# Patient Record
Sex: Male | Born: 1935 | Race: Black or African American | Hispanic: No | Marital: Married | Smoking: Former smoker
Health system: Southern US, Community
[De-identification: ages and names within clinical notes are randomized; demographics above are authoritative.]

## PROBLEM LIST (undated history)

## (undated) DIAGNOSIS — G629 Polyneuropathy, unspecified: Secondary | ICD-10-CM

## (undated) DIAGNOSIS — L89609 Pressure ulcer of unspecified heel, unspecified stage: Secondary | ICD-10-CM

## (undated) DIAGNOSIS — N183 Chronic kidney disease, stage 3 unspecified: Secondary | ICD-10-CM

## (undated) DIAGNOSIS — E039 Hypothyroidism, unspecified: Secondary | ICD-10-CM

## (undated) DIAGNOSIS — F0390 Unspecified dementia without behavioral disturbance: Secondary | ICD-10-CM

## (undated) DIAGNOSIS — Z531 Procedure and treatment not carried out because of patient's decision for reasons of belief and group pressure: Secondary | ICD-10-CM

## (undated) DIAGNOSIS — M869 Osteomyelitis, unspecified: Secondary | ICD-10-CM

## (undated) DIAGNOSIS — Z466 Encounter for fitting and adjustment of urinary device: Secondary | ICD-10-CM

## (undated) DIAGNOSIS — F4310 Post-traumatic stress disorder, unspecified: Secondary | ICD-10-CM

## (undated) DIAGNOSIS — R339 Retention of urine, unspecified: Secondary | ICD-10-CM

## (undated) DIAGNOSIS — F29 Unspecified psychosis not due to a substance or known physiological condition: Secondary | ICD-10-CM

## (undated) DIAGNOSIS — Z22322 Carrier or suspected carrier of Methicillin resistant Staphylococcus aureus: Secondary | ICD-10-CM

## (undated) DIAGNOSIS — N139 Obstructive and reflux uropathy, unspecified: Secondary | ICD-10-CM

## (undated) DIAGNOSIS — I10 Essential (primary) hypertension: Secondary | ICD-10-CM

## (undated) DIAGNOSIS — J90 Pleural effusion, not elsewhere classified: Secondary | ICD-10-CM

## (undated) DIAGNOSIS — I480 Paroxysmal atrial fibrillation: Secondary | ICD-10-CM

## (undated) DIAGNOSIS — C61 Malignant neoplasm of prostate: Secondary | ICD-10-CM

## (undated) DIAGNOSIS — K219 Gastro-esophageal reflux disease without esophagitis: Secondary | ICD-10-CM

## (undated) DIAGNOSIS — R0602 Shortness of breath: Secondary | ICD-10-CM

## (undated) HISTORY — PX: SUBDURAL HEMATOMA EVACUATION VIA CRANIOTOMY: SUR319

## (undated) HISTORY — PX: CHOLECYSTECTOMY: SHX55

## (undated) HISTORY — PX: ORCHIECTOMY: SHX2116

---

## 2001-06-04 ENCOUNTER — Encounter: Payer: Self-pay | Admitting: Emergency Medicine

## 2001-06-04 ENCOUNTER — Inpatient Hospital Stay (HOSPITAL_COMMUNITY): Admission: EM | Admit: 2001-06-04 | Discharge: 2001-06-14 | Payer: Self-pay

## 2001-06-05 ENCOUNTER — Encounter: Payer: Self-pay | Admitting: Internal Medicine

## 2001-06-07 ENCOUNTER — Encounter: Payer: Self-pay | Admitting: Neurology

## 2001-07-05 ENCOUNTER — Other Ambulatory Visit: Admission: RE | Admit: 2001-07-05 | Discharge: 2001-07-05 | Payer: Self-pay | Admitting: Urology

## 2001-08-08 ENCOUNTER — Ambulatory Visit (HOSPITAL_BASED_OUTPATIENT_CLINIC_OR_DEPARTMENT_OTHER): Admission: RE | Admit: 2001-08-08 | Discharge: 2001-08-08 | Payer: Self-pay | Admitting: Urology

## 2005-04-09 ENCOUNTER — Ambulatory Visit: Payer: Self-pay | Admitting: Family Medicine

## 2005-04-23 ENCOUNTER — Ambulatory Visit: Payer: Self-pay | Admitting: Family Medicine

## 2011-12-09 ENCOUNTER — Encounter (HOSPITAL_COMMUNITY): Payer: Self-pay | Admitting: Emergency Medicine

## 2011-12-09 ENCOUNTER — Inpatient Hospital Stay (HOSPITAL_COMMUNITY)
Admission: EM | Admit: 2011-12-09 | Discharge: 2011-12-12 | DRG: 699 | Disposition: A | Discharge status: Home / Self Care | Payer: Medicare Other | Attending: Internal Medicine | Admitting: Internal Medicine

## 2011-12-09 ENCOUNTER — Emergency Department (HOSPITAL_COMMUNITY): Payer: Medicare Other

## 2011-12-09 ENCOUNTER — Other Ambulatory Visit: Payer: Self-pay

## 2011-12-09 DIAGNOSIS — R339 Retention of urine, unspecified: Secondary | ICD-10-CM

## 2011-12-09 DIAGNOSIS — N289 Disorder of kidney and ureter, unspecified: Secondary | ICD-10-CM

## 2011-12-09 DIAGNOSIS — D649 Anemia, unspecified: Secondary | ICD-10-CM

## 2011-12-09 DIAGNOSIS — R300 Dysuria: Secondary | ICD-10-CM | POA: Diagnosis present

## 2011-12-09 DIAGNOSIS — N390 Urinary tract infection, site not specified: Secondary | ICD-10-CM | POA: Diagnosis present

## 2011-12-09 DIAGNOSIS — N139 Obstructive and reflux uropathy, unspecified: Principal | ICD-10-CM | POA: Diagnosis present

## 2011-12-09 DIAGNOSIS — Z9079 Acquired absence of other genital organ(s): Secondary | ICD-10-CM

## 2011-12-09 DIAGNOSIS — Z9981 Dependence on supplemental oxygen: Secondary | ICD-10-CM

## 2011-12-09 DIAGNOSIS — E039 Hypothyroidism, unspecified: Secondary | ICD-10-CM | POA: Diagnosis present

## 2011-12-09 DIAGNOSIS — I10 Essential (primary) hypertension: Secondary | ICD-10-CM | POA: Diagnosis present

## 2011-12-09 DIAGNOSIS — Z8546 Personal history of malignant neoplasm of prostate: Secondary | ICD-10-CM

## 2011-12-09 DIAGNOSIS — J90 Pleural effusion, not elsewhere classified: Secondary | ICD-10-CM | POA: Diagnosis present

## 2011-12-09 DIAGNOSIS — E119 Type 2 diabetes mellitus without complications: Secondary | ICD-10-CM | POA: Diagnosis present

## 2011-12-09 DIAGNOSIS — R0602 Shortness of breath: Secondary | ICD-10-CM | POA: Diagnosis present

## 2011-12-09 DIAGNOSIS — I4891 Unspecified atrial fibrillation: Secondary | ICD-10-CM | POA: Diagnosis present

## 2011-12-09 DIAGNOSIS — N179 Acute kidney failure, unspecified: Secondary | ICD-10-CM | POA: Diagnosis present

## 2011-12-09 DIAGNOSIS — I480 Paroxysmal atrial fibrillation: Secondary | ICD-10-CM | POA: Diagnosis present

## 2011-12-09 HISTORY — DX: Essential (primary) hypertension: I10

## 2011-12-09 HISTORY — DX: Unspecified psychosis not due to a substance or known physiological condition: F29

## 2011-12-09 HISTORY — DX: Malignant neoplasm of prostate: C61

## 2011-12-09 HISTORY — DX: Unspecified dementia, unspecified severity, without behavioral disturbance, psychotic disturbance, mood disturbance, and anxiety: F03.90

## 2011-12-09 HISTORY — DX: Chronic kidney disease, stage 3 (moderate): N18.3

## 2011-12-09 HISTORY — DX: Post-traumatic stress disorder, unspecified: F43.10

## 2011-12-09 HISTORY — DX: Hypothyroidism, unspecified: E03.9

## 2011-12-09 HISTORY — DX: Chronic kidney disease, stage 3 unspecified: N18.30

## 2011-12-09 HISTORY — DX: Pleural effusion, not elsewhere classified: J90

## 2011-12-09 HISTORY — DX: Paroxysmal atrial fibrillation: I48.0

## 2011-12-09 LAB — CBC
Platelets: 172 10*3/uL (ref 150–400)
RBC: 3.96 MIL/uL — ABNORMAL LOW (ref 4.22–5.81)
WBC: 14.1 10*3/uL — ABNORMAL HIGH (ref 4.0–10.5)

## 2011-12-09 LAB — DIFFERENTIAL
Basophils Absolute: 0 10*3/uL (ref 0.0–0.1)
Eosinophils Absolute: 0.2 10*3/uL (ref 0.0–0.7)
Lymphocytes Relative: 9 % — ABNORMAL LOW (ref 12–46)
Lymphs Abs: 1.3 10*3/uL (ref 0.7–4.0)
Neutrophils Relative %: 79 % — ABNORMAL HIGH (ref 43–77)

## 2011-12-09 LAB — COMPREHENSIVE METABOLIC PANEL
ALT: 10 U/L (ref 0–53)
AST: 32 U/L (ref 0–37)
Albumin: 3 g/dL — ABNORMAL LOW (ref 3.5–5.2)
Alkaline Phosphatase: 62 U/L (ref 39–117)
BUN: 51 mg/dL — ABNORMAL HIGH (ref 6–23)
Chloride: 102 mEq/L (ref 96–112)
Potassium: 4.7 mEq/L (ref 3.5–5.1)
Sodium: 137 mEq/L (ref 135–145)
Total Bilirubin: 0.3 mg/dL (ref 0.3–1.2)

## 2011-12-09 LAB — URINALYSIS, ROUTINE W REFLEX MICROSCOPIC
Bilirubin Urine: NEGATIVE
Nitrite: NEGATIVE
Specific Gravity, Urine: 1.019 (ref 1.005–1.030)
pH: 5.5 (ref 5.0–8.0)

## 2011-12-09 LAB — TROPONIN I: Troponin I: <0.3 ng/mL (ref <0.30)

## 2011-12-09 LAB — LACTIC ACID, PLASMA: Lactic Acid, Venous: 1.8 mmol/L (ref 0.5–2.2)

## 2011-12-09 LAB — URINE MICROSCOPIC-ADD ON

## 2011-12-09 MED ORDER — PANTOPRAZOLE SODIUM 40 MG PO TBEC
80.0000 mg | DELAYED_RELEASE_TABLET | Freq: Every day | ORAL | Status: DC
  Administered 2011-12-10 – 2011-12-12 (×3): 80 mg via ORAL
  Filled 2011-12-09 (×3): qty 2

## 2011-12-09 MED ORDER — POLYETHYLENE GLYCOL 3350 17 G PO PACK
17.0000 g | PACK | Freq: Every day | ORAL | Status: DC | PRN
Start: 2011-12-09 — End: 2011-12-12
  Administered 2011-12-11: 17 g via ORAL
  Filled 2011-12-09 (×2): qty 1

## 2011-12-09 MED ORDER — FOLIC ACID 1 MG PO TABS
1.0000 mg | ORAL_TABLET | Freq: Every day | ORAL | Status: DC
  Administered 2011-12-10 – 2011-12-12 (×3): 1 mg via ORAL
  Filled 2011-12-09 (×3): qty 1

## 2011-12-09 MED ORDER — ENOXAPARIN SODIUM 40 MG/0.4ML ~~LOC~~ SOLN
40.0000 mg | SUBCUTANEOUS | Status: DC
  Administered 2011-12-10 – 2011-12-12 (×3): 40 mg via SUBCUTANEOUS
  Filled 2011-12-09 (×4): qty 0.4

## 2011-12-09 MED ORDER — TAMSULOSIN HCL 0.4 MG PO CAPS
0.4000 mg | ORAL_CAPSULE | Freq: Every day | ORAL | Status: DC
  Administered 2011-12-10 – 2011-12-12 (×3): 0.4 mg via ORAL
  Filled 2011-12-09 (×3): qty 1

## 2011-12-09 MED ORDER — FERROUS FUMARATE 325 (106 FE) MG PO TABS
1.0000 | ORAL_TABLET | Freq: Two times a day (BID) | ORAL | Status: DC
  Administered 2011-12-10 – 2011-12-12 (×4): 106 mg via ORAL
  Filled 2011-12-09 (×6): qty 1

## 2011-12-09 MED ORDER — CALCIUM CARBONATE-VITAMIN D 500-200 MG-UNIT PO TABS
1.0000 | ORAL_TABLET | Freq: Two times a day (BID) | ORAL | Status: DC
  Administered 2011-12-10 – 2011-12-12 (×4): 1 via ORAL
  Filled 2011-12-09 (×6): qty 1

## 2011-12-09 MED ORDER — ACETAMINOPHEN 325 MG PO TABS
650.0000 mg | ORAL_TABLET | Freq: Four times a day (QID) | ORAL | Status: DC | PRN
  Administered 2011-12-11: 650 mg via ORAL
  Filled 2011-12-09: qty 2

## 2011-12-09 MED ORDER — ACETAMINOPHEN 650 MG RE SUPP: 650.0000 mg | Freq: Four times a day (QID) | RECTAL | Status: DC | PRN

## 2011-12-09 MED ORDER — SENNA 8.6 MG PO TABS
1.0000 | ORAL_TABLET | Freq: Every day | ORAL | Status: DC
  Administered 2011-12-10 – 2011-12-12 (×3): 8.6 mg via ORAL
  Filled 2011-12-09 (×3): qty 1

## 2011-12-09 MED ORDER — ALBUTEROL SULFATE (5 MG/ML) 0.5% IN NEBU
2.5000 mg | INHALATION_SOLUTION | RESPIRATORY_TRACT | Status: DC
  Administered 2011-12-09 – 2011-12-10 (×5): 2.5 mg via RESPIRATORY_TRACT
  Filled 2011-12-09 (×6): qty 0.5

## 2011-12-09 MED ORDER — DOCUSATE SODIUM 100 MG PO CAPS
100.0000 mg | ORAL_CAPSULE | Freq: Every day | ORAL | Status: DC
  Administered 2011-12-10 – 2011-12-12 (×3): 100 mg via ORAL
  Filled 2011-12-09 (×3): qty 1

## 2011-12-09 MED ORDER — DILTIAZEM HCL ER 240 MG PO CP24
240.0000 mg | ORAL_CAPSULE | Freq: Every day | ORAL | Status: DC
  Administered 2011-12-10 – 2011-12-12 (×3): 240 mg via ORAL
  Filled 2011-12-09 (×3): qty 1

## 2011-12-09 MED ORDER — LEVOTHYROXINE SODIUM 50 MCG PO TABS
50.0000 ug | ORAL_TABLET | Freq: Every day | ORAL | Status: DC
  Administered 2011-12-10 – 2011-12-12 (×3): 50 ug via ORAL
  Filled 2011-12-09 (×3): qty 1

## 2011-12-09 MED ORDER — FUROSEMIDE 20 MG PO TABS
20.0000 mg | ORAL_TABLET | Freq: Every day | ORAL | Status: DC
  Filled 2011-12-09: qty 1

## 2011-12-09 MED ORDER — CIPROFLOXACIN IN D5W 200 MG/100ML IV SOLN
200.0000 mg | Freq: Two times a day (BID) | INTRAVENOUS | Status: DC
  Administered 2011-12-09 – 2011-12-10 (×3): 200 mg via INTRAVENOUS
  Filled 2011-12-09 (×5): qty 100

## 2011-12-09 MED ORDER — IPRATROPIUM BROMIDE 0.02 % IN SOLN
0.5000 mg | RESPIRATORY_TRACT | Status: DC
  Administered 2011-12-09 – 2011-12-10 (×5): 0.5 mg via RESPIRATORY_TRACT
  Filled 2011-12-09 (×6): qty 2.5

## 2011-12-09 MED ORDER — SENNOSIDES 8.6 MG PO TABS: 1.0000 | ORAL_TABLET | Freq: Every day | ORAL | Status: DC

## 2011-12-09 MED ORDER — SODIUM CHLORIDE 0.9 % IV SOLN
INTRAVENOUS | Status: DC
  Administered 2011-12-09 (×2): via INTRAVENOUS
  Administered 2011-12-10: 1000 mL via INTRAVENOUS
  Administered 2011-12-10 – 2011-12-11 (×2): via INTRAVENOUS

## 2011-12-09 NOTE — ED Provider Notes (Signed)
History     CSN: [id number]  Arrival date & time 12/09/11  1708   First MD Initiated Contact with Patient 12/09/11 1729      Chief Complaint  Patient presents with  . Weakness  . Urinary Retention    The history is provided by a relative and a caregiver. History Limited By: poor historian.  Pt was seen at 1745.  Per pt's caregiver/daughter, c/o gradual onset and worsening of persistent decreased urination and decreased PO intake for the past week.  Has been assoc with generalized weakness.  Pt was eval by Olivia Mackie MD PTA, foley passed with approx urine return, and pt was then sent to the ED for further eval and admit.  No reported N/V/D, no fevers, no CP/SOB.     Past Medical History  Diagnosis Date  . Prostate cancer   . Diabetes mellitus   . Hypertension   . Hypothyroidism   . Psychosis     Past Surgical History  Procedure Date  . Orchiectomy     bilateral  . Subdural hematoma evacuation via craniotomy   . Cholecystectomy     History  Substance Use Topics  . Smoking status: Never Smoker   . Smokeless tobacco: Not on file  . Alcohol Use: No      Review of Systems  Unable to perform ROS: Other    Allergies  Review of patient's allergies indicates no known allergies.  Home Medications   Current Outpatient Rx  Name Route Sig Dispense Refill  . CALCIUM CARBONATE-VITAMIN D 500-200 MG-UNIT PO TABS Oral Take 1 tablet by mouth 2 (two) times daily.    Marland Kitchen DILTIAZEM HCL ER 240 MG PO CP24 Oral Take 240 mg by mouth daily.    Marland Kitchen DOCUSATE SODIUM 100 MG PO CAPS Oral Take 100 mg by mouth daily.    Marland Kitchen ESOMEPRAZOLE MAGNESIUM 40 MG PO CPDR Oral Take 40 mg by mouth daily.    Marland Kitchen FERROUS FUMARATE 325 (106 FE) MG PO TABS Oral Take 1 tablet by mouth 2 (two) times daily.    Marland Kitchen FOLIC ACID 1 MG PO TABS Oral Take 1 mg by mouth daily.    . FUROSEMIDE 20 MG PO TABS Oral Take 20 mg by mouth daily.    . IPRATROPIUM-ALBUTEROL 0.5-2.5 (3) MG/3ML IN SOLN Nebulization Take 3 mLs by  nebulization every 6 (six) hours as needed. shorntess of breath/ wheezing    . LEVOTHYROXINE SODIUM 50 MCG PO TABS Oral Take 50 mcg by mouth daily.    Marland Kitchen POLYETHYLENE GLYCOL 3350 PO PACK Oral Take 17 g by mouth daily as needed. constipation    . SENNOSIDES 8.6 MG PO TABS Oral Take 1 tablet by mouth daily.    . SULFAMETHOXAZOLE-TRIMETHOPRIM 800-160 MG PO TABS Oral Take 1 tablet by mouth 2 (two) times daily.    Marland Kitchen TAMSULOSIN HCL 0.4 MG PO CAPS Oral Take 0.4 mg by mouth daily.      BP 115/39  Pulse 64  Temp(Src) 98.9 F (37.2 C) (Oral)  Resp 20  SpO2 98%  Physical Exam 1750: Physical examination:  Nursing notes reviewed; Vital signs and O2 SAT reviewed;  Constitutional: Well developed, Well nourished, In no acute distress; Head:  Normocephalic, atraumatic; Eyes: EOMI, PERRL, No scleral icterus; ENMT: Mouth and pharynx normal, Mucous membranes dry; Neck: Supple, Full range of motion, No lymphadenopathy; Cardiovascular: Regular rate and rhythm, No murmur or gallop; Respiratory: Breath sounds coarse & equal bilaterally, No rales, rhonchi, wheezes, Normal respiratory effort/excursion;  Chest: Nontender, Movement normal; Abdomen: Soft, +suprapubic area tender to palp, Nondistended, Normal bowel sounds, +foley draining dark brown urine; Extremities: Pulses normal, No tenderness, 1+ pedal edema bilat without calf asymmetry.; Neuro: Awake, alert, confused re: events.  Speech clear, no facial droop, moves all ext on stretcher.; Skin: Color normal, Warm, Dry, no rash.    ED Course  Procedures    MDM  MDM Reviewed: nursing note, vitals and previous chart Reviewed previous: ECG and labs Interpretation: ECG, labs and x-ray     Date: 12/09/2011  Rate: 71  Rhythm: normal sinus rhythm and premature atrial contractions (PAC)  QRS Axis: left  Intervals: PR prolonged  ST/T Wave abnormalities: nonspecific ST/T changes  Conduction Disutrbances:first-degree A-V block  and nonspecific intraventricular  conduction delay  Narrative Interpretation:   Old EKG Reviewed: changes noted; new flipped T-waves lateral leads compared to previous EKG dated 06/04/2001.   Results for orders placed during the hospital encounter of 12/09/11  CBC      Component Value Range   WBC 14.1 (*) 4.0 - 10.5 (K/uL)   RBC 3.96 (*) 4.22 - 5.81 (MIL/uL)   Hemoglobin 10.8 (*) 13.0 - 17.0 (g/dL)   HCT 04.5 (*) 40.9 - 52.0 (%)   MCV 88.1  78.0 - 100.0 (fL)   MCH 27.3  26.0 - 34.0 (pg)   MCHC 30.9  30.0 - 36.0 (g/dL)   RDW 81.1  91.4 - 78.2 (%)   Platelets 172  150 - 400 (K/uL)  DIFFERENTIAL      Component Value Range   Neutrophils Relative 79 (*) 43 - 77 (%)   Neutro Abs 11.2 (*) 1.7 - 7.7 (K/uL)   Lymphocytes Relative 9 (*) 12 - 46 (%)   Lymphs Abs 1.3  0.7 - 4.0 (K/uL)   Monocytes Relative 10  3 - 12 (%)   Monocytes Absolute 1.4 (*) 0.1 - 1.0 (K/uL)   Eosinophils Relative 1  0 - 5 (%)   Eosinophils Absolute 0.2  0.0 - 0.7 (K/uL)   Basophils Relative 0  0 - 1 (%)   Basophils Absolute 0.0  0.0 - 0.1 (K/uL)  LACTIC ACID, PLASMA      Component Value Range   Lactic Acid, Venous 1.8  0.5 - 2.2 (mmol/L)  TROPONIN I      Component Value Range   Troponin I <0.30  <0.30 (ng/mL)  PROCALCITONIN      Component Value Range   Procalcitonin 0.92    URINALYSIS, ROUTINE W REFLEX MICROSCOPIC      Component Value Range   Color, Urine AMBER (*) YELLOW    APPearance TURBID (*) CLEAR    Specific Gravity, Urine 1.019  1.005 - 1.030    pH 5.5  5.0 - 8.0    Glucose, UA NEGATIVE  NEGATIVE (mg/dL)   Hgb urine dipstick LARGE (*) NEGATIVE    Bilirubin Urine NEGATIVE  NEGATIVE    Ketones, ur NEGATIVE  NEGATIVE (mg/dL)   Protein, ur 956 (*) NEGATIVE (mg/dL)   Urobilinogen, UA 0.2  0.0 - 1.0 (mg/dL)   Nitrite NEGATIVE  NEGATIVE    Leukocytes, UA LARGE (*) NEGATIVE   COMPREHENSIVE METABOLIC PANEL      Component Value Range   Sodium 137  135 - 145 (mEq/L)   Potassium 4.7  3.5 - 5.1 (mEq/L)   Chloride 102  96 - 112 (mEq/L)    CO2 24  19 - 32 (mEq/L)   Glucose, Bld 138 (*) 70 - 99 (mg/dL)  BUN 51 (*) 6 - 23 (mg/dL)   Creatinine, Ser 4.09 (*) 0.50 - 1.35 (mg/dL)   Calcium 8.7  8.4 - 81.1 (mg/dL)   Total Protein 7.3  6.0 - 8.3 (g/dL)   Albumin 3.0 (*) 3.5 - 5.2 (g/dL)   AST 32  0 - 37 (U/L)   ALT 10  0 - 53 (U/L)   Alkaline Phosphatase 62  39 - 117 (U/L)   Total Bilirubin 0.3  0.3 - 1.2 (mg/dL)   GFR calc non Af Amer 16 (*) >90 (mL/min)   GFR calc Af Amer 18 (*) >90 (mL/min)  URINE MICROSCOPIC-ADD ON      Component Value Range   Squamous Epithelial / LPF RARE  RARE    WBC, UA 11-20  <3 (WBC/hpf)   RBC / HPF 21-50  <3 (RBC/hpf)   Bacteria, UA RARE  RARE    Urine-Other YEAST     Dg Chest 2 View 12/09/2011  *RADIOLOGY REPORT*  Clinical Data: Short of breath  CHEST - 2 VIEW  Comparison: 09/07/2011  Findings: Mild cardiomegaly.  Bilateral pleural effusions right greater than left.  Bibasilar opacities.  No pneumothorax.  Stable thoracic spine. Left PICC has been removed.  IMPRESSION: Bilateral pleural effusions and bibasilar opacities right greater than left stable.  Original Report Authenticated By: Donavan Burnet, M.D.     8:49 PM:  Elevated BUN/Cr, no old to compare.  Foley continuing to drain dark urine, UC pending.  Dx testing d/w pt and family.  Questions answered.  Verb understanding, agreeable to admit.  T/C to [facility], case discussed, including:  HPI, pertinent PM/SHx, VS/PE, dx testing, ED course and treatment:  Agreeable to admit, requests to obtain medical bed to team 1.         Laray Anger, DO 12/10/11 1506

## 2011-12-09 NOTE — H&P (Signed)
PCP:  Jesse Chandler in [place]  DOA:  12/09/2011  5:09 PM  Chief Complaint:  abdominal discomfort with Poor urine output for 3 days  HPI: History mainly provided by Daughter Jesse Chandler .  78 y/o AA male wth hx of prostate ca s/p resection in 2002, HTN, Paroxysmal a fib not on AC, hx of pleural effusion with pleural tap done at [place] twice last year and also a rt sided chest tube placed in without clear diagnosis, on chronic O2 via [place] ,  pseudomonas UTI , Hypothyroidism was sent to ED by urologist Dr Marrian Salvage at [facility] urology as patient was noted to have obstructive uropathy. As per daughter who lives with him patient was having very little urine output for past 3 days and having poor appetite with c/o abdominal discomfort. Since yesterday he hardly made any urine and his PCP set him up to see the  Urologist. The urologist placed in a foley and had 2 L urine output and sent patient to ED. Patient on exam denies any symptoms beside lower abdominal discomfort. Daughter informs patient having poor appetite for past  3 days and c/o dysuria but denies any fever,chllls, nausea, vomiting, headache, chest pain,palitation, SOB,cough  And bowel symptoms. Labs sent in ED showing leucocytosis, AKI and UA suggestive of UTI.  Allergies: No Known Allergies  Prior to Admission medications   Medication Sig Start Date End Date Taking? Authorizing Provider  calcium-vitamin D (OSCAL WITH D) 500-200 MG-UNIT per tablet Take 1 tablet by mouth 2 (two) times daily.   Yes Historical Provider, MD  diltiazem (DILACOR XR) 240 MG 24 hr capsule Take 240 mg by mouth daily.   Yes Historical Provider, MD  docusate sodium (COLACE) 100 MG capsule Take 100 mg by mouth daily.   Yes Historical Provider, MD  esomeprazole (NEXIUM) 40 MG capsule Take 40 mg by mouth daily.   Yes Historical Provider, MD  ferrous fumarate (HEMOCYTE - 106 MG FE) 325 (106 FE) MG TABS Take 1 tablet by mouth 2 (two) times daily.   Yes Historical Provider,  MD  folic acid (FOLVITE) 1 MG tablet Take 1 mg by mouth daily.   Yes Historical Provider, MD  furosemide (LASIX) 20 MG tablet Take 20 mg by mouth daily.   Yes Historical Provider, MD  ipratropium-albuterol (DUONEB) 0.5-2.5 (3) MG/3ML SOLN Take 3 mLs by nebulization every 6 (six) hours as needed. shorntess of breath/ wheezing   Yes Historical Provider, MD  levothyroxine (SYNTHROID, LEVOTHROID) 50 MCG tablet Take 50 mcg by mouth daily.   Yes Historical Provider, MD  polyethylene glycol (MIRALAX / GLYCOLAX) packet Take 17 g by mouth daily as needed. constipation   Yes Historical Provider, MD  senna (SENOKOT) 8.6 MG tablet Take 1 tablet by mouth daily.   Yes Historical Provider, MD  sulfamethoxazole-trimethoprim (BACTRIM DS,SEPTRA DS) 800-160 MG per tablet Take 1 tablet by mouth 2 (two) times daily.   Yes Historical Provider, MD  Tamsulosin HCl (FLOMAX) 0.4 MG CAPS Take 0.4 mg by mouth daily.   Yes Historical Provider, MD    Past Medical History  Diagnosis Date  . Prostate cancer   . Diabetes mellitus   . Hypertension   . Hypothyroidism   . Psychosis     Past Surgical History  Procedure Date  . Orchiectomy     bilateral  . Subdural hematoma evacuation via craniotomy   . Cholecystectomy     Social History:  reports that he has never smoked. He has never used smokeless tobacco.  He reports that he drinks alcohol. He reports that he does not use illicit drugs.  History reviewed. No pertinent family history.  Review of Systems:  Constitutional: Poor appetite,  Denies fever, chills, diaphoresis,  and fatigue.  HEENT: Denies photophobia, eye pain, redness, hearing loss, ear pain, congestion, sore throat, rhinorrhea, sneezing, mouth sores, trouble swallowing, neck pain, neck stiffness and tinnitus.   Respiratory: chronic shortness of breath on exertion,  Denies cough, chest tightness,  and wheezing.   Cardiovascular: Denies chest pain, palpitations and leg swelling.  Gastrointestinal: lower  abdominal discomfort, Denies nausea, vomiting,  diarrhea, constipation, blood in stool and abdominal distention.  Genitourinary: dysuria with minimal urine output for past 3 days Musculoskeletal: Denies myalgias, back pain, joint swelling, arthralgias and gait problem.  Skin: Denies pallor, rash and wound.  Neurological: Denies dizziness, seizures, syncope, weakness, light-headedness, numbness and headaches.  Hematological: Denies adenopathy. Easy bruising, personal or family bleeding history  Psychiatric/Behavioral: Denies suicidal ideation, mood changes, confusion, nervousness, sleep disturbance and agitation   Physical Exam:  Filed Vitals:   12/09/11 1723 12/09/11 1806 12/09/11 1855 12/09/11 1954  BP: 115/39   122/40  Pulse: 64   68  Temp:  98.9 F (37.2 C) 99.2 F (37.3 C) 98.8 F (37.1 C)  TempSrc:  Oral Rectal Oral  Resp: 20   20  SpO2: 98%   98%    Constitutional: Vital signs reviewed.  Patient is a well-developed and well-nourished in no acute distress and cooperative with exam. Alert and oriented x3.  HEENT: no pallor, moist oral mucosa Cardiovascular: RRR, S1 normal, S2 normal, no MRG, pulses symmetric and intact bilaterally Pulmonary/Chest: diminished breath sounds over rt lung , no wheezes, rales, or rhonchi Abdominal: Soft. Mildly distended and some tenderness over suprapubic area,  bowel sounds are normal, no masses, organomegaly, or guarding present. Foley in place draining clear urine GU: no CVA tenderness Musculoskeletal: No joint deformities, erythema, or stiffness, ROM full and no nontender Ext: 1+ edema b/l,  no cyanosis, pulses palpable bilaterally (DP and PT) Hematology: no cervical, inginal, or axillary adenopathy.  Neurological: A&O x3, Strenght is normal and symmetric bilaterally, cranial nerve II-XII are grossly intact, no focal motor deficit, sensory intact to light touch bilaterally.  Skin: Warm, dry and intact. No rash, cyanosis, or clubbing.    Psychiatric: Normal mood and affect. speech and behavior is normal. Judgment and thought content normal. Cognition and memory are normal.   Labs on Admission:  Results for orders placed during the hospital encounter of 12/09/11 (from the past 48 hour(s))  URINALYSIS, ROUTINE W REFLEX MICROSCOPIC     Status: Abnormal   Collection Time   12/09/11  6:33 PM      Component Value Range Comment   Color, Urine AMBER (*) YELLOW  BIOCHEMICALS MAY BE AFFECTED BY COLOR   APPearance TURBID (*) CLEAR     Specific Gravity, Urine 1.019  1.005 - 1.030     pH 5.5  5.0 - 8.0     Glucose, UA NEGATIVE  NEGATIVE (mg/dL)    Hgb urine dipstick LARGE (*) NEGATIVE     Bilirubin Urine NEGATIVE  NEGATIVE     Ketones, ur NEGATIVE  NEGATIVE (mg/dL)    Protein, ur 098 (*) NEGATIVE (mg/dL)    Urobilinogen, UA 0.2  0.0 - 1.0 (mg/dL)    Nitrite NEGATIVE  NEGATIVE     Leukocytes, UA LARGE (*) NEGATIVE    URINE MICROSCOPIC-ADD ON     Status: Normal   Collection  Time   12/09/11  6:33 PM      Component Value Range Comment   Squamous Epithelial / LPF RARE  RARE     WBC, UA 11-20  <3 (WBC/hpf)    RBC / HPF 21-50  <3 (RBC/hpf)    Bacteria, UA RARE  RARE     Urine-Other YEAST     LACTIC ACID, PLASMA     Status: Normal   Collection Time   12/09/11  6:35 PM      Component Value Range Comment   Lactic Acid, Venous 1.8  0.5 - 2.2 (mmol/L)   CBC     Status: Abnormal   Collection Time   12/09/11  6:36 PM      Component Value Range Comment   WBC 14.1 (*) 4.0 - 10.5 (K/uL)    RBC 3.96 (*) 4.22 - 5.81 (MIL/uL)    Hemoglobin 10.8 (*) 13.0 - 17.0 (g/dL)    HCT 40.9 (*) 81.1 - 52.0 (%)    MCV 88.1  78.0 - 100.0 (fL)    MCH 27.3  26.0 - 34.0 (pg)    MCHC 30.9  30.0 - 36.0 (g/dL)    RDW 91.4  78.2 - 95.6 (%)    Platelets 172  150 - 400 (K/uL)   DIFFERENTIAL     Status: Abnormal   Collection Time   12/09/11  6:36 PM      Component Value Range Comment   Neutrophils Relative 79 (*) 43 - 77 (%)    Neutro Abs 11.2 (*) 1.7 - 7.7  (K/uL)    Lymphocytes Relative 9 (*) 12 - 46 (%)    Lymphs Abs 1.3  0.7 - 4.0 (K/uL)    Monocytes Relative 10  3 - 12 (%)    Monocytes Absolute 1.4 (*) 0.1 - 1.0 (K/uL)    Eosinophils Relative 1  0 - 5 (%)    Eosinophils Absolute 0.2  0.0 - 0.7 (K/uL)    Basophils Relative 0  0 - 1 (%)    Basophils Absolute 0.0  0.0 - 0.1 (K/uL)   PROCALCITONIN     Status: Normal   Collection Time   12/09/11  6:36 PM      Component Value Range Comment   Procalcitonin 0.92     TROPONIN I     Status: Normal   Collection Time   12/09/11  6:37 PM      Component Value Range Comment   Troponin I <0.30  <0.30 (ng/mL)   COMPREHENSIVE METABOLIC PANEL     Status: Abnormal   Collection Time   12/09/11  6:37 PM      Component Value Range Comment   Sodium 137  135 - 145 (mEq/L)    Potassium 4.7  3.5 - 5.1 (mEq/L)    Chloride 102  96 - 112 (mEq/L)    CO2 24  19 - 32 (mEq/L)    Glucose, Bld 138 (*) 70 - 99 (mg/dL)    BUN 51 (*) 6 - 23 (mg/dL)    Creatinine, Ser 2.13 (*) 0.50 - 1.35 (mg/dL)    Calcium 8.7  8.4 - 10.5 (mg/dL)    Total Protein 7.3  6.0 - 8.3 (g/dL)    Albumin 3.0 (*) 3.5 - 5.2 (g/dL)    AST 32  0 - 37 (U/L)    ALT 10  0 - 53 (U/L)    Alkaline Phosphatase 62  39 - 117 (U/L)    Total Bilirubin 0.3  0.3 -  1.2 (mg/dL)    GFR calc non Af Amer 16 (*) >90 (mL/min)    GFR calc Af Amer 18 (*) >90 (mL/min)     Radiological Exams on Admission: *RADIOLOGY REPORT*  Clinical Data: Short of breath  CHEST - 2 VIEW  Comparison: 09/07/2011  Findings: Mild cardiomegaly. Bilateral pleural effusions right  greater than left. Bibasilar opacities. No pneumothorax. Stable  thoracic spine. Left PICC has been removed.  IMPRESSION:  Bilateral pleural effusions and bibasilar opacities right greater  than left stable.   Assessment/ 78 y/o male with hx of HTN, prostate ca, paroxysmal afib, unexplained b/l pleural effusion ( as per daughter) presented with symptoms of obstructive uropathy and UTI.   Plan   *Obstructive uropathy Patient had foley placed by urolgoy in clinic with good amout of urine output  continue foley with close urine output monitoring Continue flomax patient has hx of prostate resection for prostate ca in 2002  as per patient's urology will follow patient in the hospital . He was seen by [facility] in clinic monitor I/0 Monitor lytes in am    UTI (urinary tract infection) He does have symptoms of dysuria with leucocytosis and hx of pseudomonas UTI in past  will treat empirically with IV cipro  follow cx   Hypertension Stable  cont home meds    Paroxysmal a-fib Stable and in sinus rhythm   cont cardizem    Pleural effusion, bilateral As per daughter had b/l thoracentesis done in April and  august last year and also required rt side chest tubes for few weeks. daughter unaware what was found and says it was recurrent an thus he's on nebs at home and home o2.  she will bring all test results done at [place] tomorrow to the hospital. She also thinks an echo was done there as well. Will hold further test and procedure for now and patient does not seem to be in any distress cont o2 via [place] and prn nebs    Hypothyroidism Cont synthroid   DVT prophylaxis Sq lovenox  Full code Time Spent on Admission: 50 minutes  Neela Zecca 12/09/2011, 10:06 PM

## 2011-12-09 NOTE — ED Notes (Signed)
Pt in with family c/o urinary retention and decreased intake over last week, history of kidney failure, went to [facility] urology PTA and had foley placed with output, sent over for further evaluation, pt c/o abd pain

## 2011-12-10 ENCOUNTER — Encounter (HOSPITAL_COMMUNITY): Payer: Self-pay | Admitting: Internal Medicine

## 2011-12-10 DIAGNOSIS — N179 Acute kidney failure, unspecified: Secondary | ICD-10-CM | POA: Diagnosis present

## 2011-12-10 LAB — URINE CULTURE

## 2011-12-10 LAB — CBC
HCT: 32.7 % — ABNORMAL LOW (ref 39.0–52.0)
MCH: 27 pg (ref 26.0–34.0)
MCHC: 30.6 g/dL (ref 30.0–36.0)
MCV: 88.4 fL (ref 78.0–100.0)
Platelets: 183 10*3/uL (ref 150–400)
RDW: 14.8 % (ref 11.5–15.5)

## 2011-12-10 LAB — BASIC METABOLIC PANEL
BUN: 47 mg/dL — ABNORMAL HIGH (ref 6–23)
CO2: 25 mEq/L (ref 19–32)
Calcium: 8.6 mg/dL (ref 8.4–10.5)
Chloride: 106 mEq/L (ref 96–112)
Creatinine, Ser: 2.85 mg/dL — ABNORMAL HIGH (ref 0.50–1.35)
Glucose, Bld: 91 mg/dL (ref 70–99)

## 2011-12-10 MED ORDER — ALBUTEROL SULFATE (5 MG/ML) 0.5% IN NEBU
2.5000 mg | INHALATION_SOLUTION | Freq: Three times a day (TID) | RESPIRATORY_TRACT | Status: DC
  Administered 2011-12-11 – 2011-12-12 (×5): 2.5 mg via RESPIRATORY_TRACT
  Filled 2011-12-10 (×5): qty 0.5

## 2011-12-10 MED ORDER — IPRATROPIUM BROMIDE 0.02 % IN SOLN
0.5000 mg | Freq: Three times a day (TID) | RESPIRATORY_TRACT | Status: DC
  Administered 2011-12-11 – 2011-12-12 (×5): 0.5 mg via RESPIRATORY_TRACT
  Filled 2011-12-10 (×5): qty 2.5

## 2011-12-10 MED ORDER — IPRATROPIUM BROMIDE 0.02 % IN SOLN: 0.5000 mg | RESPIRATORY_TRACT | Status: DC | PRN

## 2011-12-10 MED ORDER — ALBUTEROL SULFATE (5 MG/ML) 0.5% IN NEBU: 2.5000 mg | INHALATION_SOLUTION | RESPIRATORY_TRACT | Status: DC | PRN

## 2011-12-10 NOTE — Progress Notes (Addendum)
PROGRESS NOTE  [name] AVW:[id number] DOB: 1923-10-21 DOA: 12/09/2011 PCP: [name], MD, MD Urologist: Dr. Marcine Matar  Brief narrative: Jesse Chandler is an 77 year old man with a past medical history of prostate cancer status post resection in 2002 who was admitted on 12/09/2011 with abdominal discomfort and obstructive uropathy. The patient apparently was seen by his urologist who advised him to come to the emergency department for evaluation, after placing a Foley catheter in draining 2 L of urine from his distended bladder.  Assessment/Plan: Principal Problem:  *Obstructive uropathy /  Acute kidney injury /  H/O prostate cancer The patient was seen by his urologist on 12/09/2011 and a Foley catheter was placed. His initial creatinine was 3.30, and with placement of the Foley and IV fluids, his creatinine is beginning to come down. He is on Flomax. Renal ultrasound was ordered however, given his urine output of 2 L after the Foley was placed, is not likely to show any pathology. The prostate is unable to be visualized with ultrasound. I have therefore canceled the ultrasound. We'll continue to hydrate and monitor renal function for recovery.  Last PSA was 10, according to the patient's daughter, who is planning on bringing old records. Active Problems:  Hypertension We'll continue Diltiazem.  Hold Lasix given AKI.  UTI (urinary tract infection) Urine cultures were sent on 12/09/2011. The patient is being treated empirically with renally dosed Cipro. Followup urine cultures when available.  Paroxysmal a-fib Continue diltiazem for rate control. He is not on Coumadin.  Pleural effusion, bilateral The patient has required thoracentesis in the past. He is also had chest tubes placed. Given that we are holding Lasix, we'll need to monitor closely for signs of respiratory compromise. Cautious hydration.  Hypothyroidism Continue home dose of Synthroid.    Code Status: Full code Family  Communication: Uel, [place] Daughter [phone], spoke her by telephone. Disposition Plan: Home, when stable.  Consultants:  Urology consult pending  Procedures:  EKG 12/09/11: SINUS RHYTHM ~ normal P axis, V-rate 50- 99 ATRIAL PREMATURE COMPLEX ~ SV complex w/ short R-R interval BORDERLINE AV CONDUCTION DELAY ~ PR >210, V-rate 50- 90 IVCD, CONSIDER ATYPICAL LBBB ~ QRSd>120, notch/slur R I aVL V5-6  Antibiotics:  Cipro 12/09/11 --->   Subjective  Jesse Chandler states that his abdominal pain has improved. He is eating well and denies any nausea or vomiting. No pain in other areas. Denies dyspnea.   Objective    Interim History: Jesse Chandler was stable overnight.   Objective: Filed Vitals:   12/09/11 2235 12/09/11 2356 12/10/11 0406 12/10/11 0552  BP:    139/57  Pulse:    85  Temp:    98.9 F (37.2 C)  TempSrc:    Oral  Resp:    20  Height: 6\' 1"  (1.854 m)     Weight: 108.183 kg (238 lb 8 oz)     SpO2:  92% 97% 96%    Intake/Output Summary (Last 24 hours) at 12/10/11 0837 Last data filed at 12/10/11 0500  Gross per 24 hour  Intake    340 ml  Output    500 ml  Net   -160 ml    Exam: Gen:  NAD Cardiovascular:  RRR, No M/R/G Respiratory: Crackles left base, diminished bilateral bases Gastrointestinal: Abdomen soft, NT/ND with normal active bowel sounds. Extremities: No C/E/C    Data Reviewed: Basic Metabolic Panel:  Lab 12/10/11 6578 12/09/11 1837  NA 139 137  K 4.2 4.7  CL 106 102  CO2 25 24  GLUCOSE 91 138*  BUN 47* 51*  CREATININE 2.85* 3.30*  CALCIUM 8.6 8.7  MG -- --  PHOS -- --   Liver Function Tests:  Lab 12/09/11 1837  AST 32  ALT 10  ALKPHOS 62  BILITOT 0.3  PROT 7.3  ALBUMIN 3.0*   CBC:  Lab 12/09/11 1836  WBC 14.1*  NEUTROABS 11.2*  HGB 10.8*  HCT 34.9*  MCV 88.1  PLT 172   Cardiac Enzymes:  Lab 12/09/11 1837  CKTOTAL --  CKMB --  CKMBINDEX --  TROPONINI <0.30     Ref. Range 12/09/2011 18:35 12/09/2011 18:36    Lactic Acid, Venous Latest Range: 0.5-2.2 mmol/L 1.8   Procalcitonin No range found  0.92    Studies: Dg Chest 2 View  12/09/2011  *RADIOLOGY REPORT*  Clinical Data: Short of breath  CHEST - 2 VIEW  Comparison: 09/07/2011  Findings: Mild cardiomegaly.  Bilateral pleural effusions right greater than left.  Bibasilar opacities.  No pneumothorax.  Stable thoracic spine. Left PICC has been removed.  IMPRESSION: Bilateral pleural effusions and bibasilar opacities right greater than left stable.  Original Report Authenticated By: Donavan Burnet, M.D.    Scheduled Meds:    . ipratropium  0.5 mg Nebulization Q4H   And  . albuterol  2.5 mg Nebulization Q4H  . calcium-vitamin D  1 tablet Oral BID  . ciprofloxacin  200 mg Intravenous Q12H  . diltiazem  240 mg Oral Daily  . docusate sodium  100 mg Oral Daily  . enoxaparin  40 mg Subcutaneous Q24H  . ferrous fumarate  1 tablet Oral BID  . folic acid  1 mg Oral Daily  . furosemide  20 mg Oral Daily  . levothyroxine  50 mcg Oral Q breakfast  . pantoprazole  80 mg Oral Q1200  . senna  1 tablet Oral Daily  . Tamsulosin HCl  0.4 mg Oral Daily  . DISCONTD: senna  1 tablet Oral Daily   Continuous Infusions:    . sodium chloride 75 mL/hr at 12/09/11 2332      LOS: 1 day   Hillery Aldo, MD Pager [phone]  12/10/2011, 8:37 AM

## 2011-12-10 NOTE — Progress Notes (Signed)
12/10/11 1050  PT Visit Information  Last PT Received On 12/10/11  Bed Mobility  Bed Mobility Yes  Sit to Supine 1: +2 Total assist;With rail;Patient percentage (comment) (pt 30%, very stiff and limited with movement inititation)  Sit to Supine - Details (indicate cue type and reason) Pt very stiff and max cues for next teps and very slow to respond, however very aware of of commands from a  cognitive standpoint.    Transfers  Transfers Yes  Sit to Stand 1: +2 Total assist;From chair/3-in-1;Patient percentage (comment) (pt 50%, very difficult from low recliner needed A for lift )  Sit to Stand Details (indicate cue type and reason) pt 50%, very difficult from low recliner needed A for lift off and max A for transfer prep and scooting hips with max cueing for scooting hips.  Once pt in standing at RW, cues or upright posture, but continued to take small shuffled steps to the bed for the pivot with RW with only modA of one person for pivot part of transfer.   Ambulation/Gait  Ambulation/Gait Yes  Ambulation/Gait Assistance 3: Mod assist  Ambulation/Gait Assistance Details (indicate cue type and reason) 5-7 steps fopr picot part of tranfer, with flexed upper trunk and slightly bent bilateral knees.  Assistive device Rolling walker  Gait Pattern Shuffle  PT - End of Session  Nurse Communication Mobility status for transfers (educated tech in room of patient's assist level for tanfers)  General  Behavior During Session [facility] for tasks performed  Cognition [facility] for tasks performed  PT - Assessment/Plan  Comments on Treatment Session Pt very cognitively intact, however processed and intitiated movment very slow and very challenged with weight shifting for tranfer prep and also bed mobilty.  Pt appears very rigid/stiff with movment patterns. Tolerated tranfer well, just educated caregivers on need for cues and asist at hips for lift off for patient when transferring from a lower surface like th  recliner.    PT Plan Discharge plan remains appropriate   Marella Bile, PT Pager: [phone] 12/10/2011

## 2011-12-10 NOTE — Progress Notes (Signed)
UR complete 

## 2011-12-10 NOTE — Progress Notes (Signed)
12/10/11 0800  PT Visit Information  Last PT Received On 12/10/11  Home Living  Lives With Daughter  Receives Help From Personal care attendant;Family (daughter and aid (5 days per week 5 hours per day), also has help from granddaughter who is a CNA  Type of Home House  Home Layout Multi-level (split level, he dstays on the first level)  Home Access Ramped entrance  Bathroom Shower/Tub (takes a sponge bath)  Home Adaptive Equipment Walker - rolling;Bedside commode/3-in-1  Additional Comments Home O2 PRN per patient.   Prior Function  Level of Independence Requires assistive device for independence;Needs assistance with ADLs;Needs assistance with homemaking  Cognition  Arousal/Alertness Awake/alert  Overall Cognitive Status Appears within functional limits for tasks assessed (not specifically tested, patient is HOH)  Bed Mobility  Bed Mobility Yes  Supine to Sit 3: Mod assist;HOB elevated (Comment degrees) (HOB 40 degrees)  Supine to Sit Details (indicate cue type and reason) mod assist to support trunk and move legs to EOB  Sitting - Scoot to Edge of Bed 3: Mod assist;With rail  Sitting - Scoot to [place] of Bed Details (indicate cue type and reason) mod assist to weight shift hips to EOB   Transfers  Transfers Yes  Sit to Stand 3: Mod assist;From bed;From elevated surface  Sit to Stand Details (indicate cue type and reason) mod assist from elevated bed took 2 attempts to be successful. Patient needed support at trunk and to anteriorly weight shift over feet (posterior lean upon stanidng).  The patient is also very flexed over the RW.    Stand to Sit 3: Mod assist;Without upper extremity assist;To chair/3-in-1;To elevated surface  Stand to Sit Details mod assist to control descent to sit.  Verbal cues to reach for armrests  Stand Pivot Transfers 3: Mod assist;From elevated surface  Stand Pivot Transfer Details (indicate cue type and reason) mod assist of trunk for stability and  balance and to maneuver RW around with patient.  The patient is very flexed over Rw.    Ambulation/Gait  Ambulation/Gait Yes  Ambulation/Gait Assistance 3: Mod assist  Ambulation/Gait Assistance Details (indicate cue type and reason) Mod assist to help support trunk fro balance, and move RW around  Ambulation Distance (Feet) 3 Feet  Assistive device Rolling walker  Gait Pattern Shuffle;Trunk flexed  Static Sitting Balance  Static Sitting - Balance Support Feet supported;Bilateral upper extremity supported  Static Sitting - Level of Assistance 6: Modified independent (Device/Increase time)  Static Sitting - Comment/# of Minutes 3-5 mins EOB.    RLE Strength  RLE Overall Strength Comments grossly 3/5  LLE Strength  LLE Overall Strength Comments grossly 3/5  PT - End of Session  Equipment Utilized During Treatment Gait belt (RW)  Activity Tolerance Patient limited by fatigue  Patient left in chair;with call bell in reach  General  Behavior During Session [facility] for tasks performed  Cognition [facility] for tasks performed  PT Assessment  Clinical Impression Statement 78 y.o. male admitted to [facility] for obstructive uropathy, UTI, and bil pleural effusion.  He presents today with decreased leg strength, decreased mobility, decreased gait ability and transfer ability as well as balance.  He would benefit from skilled acute PT to maximize his independence, functional mobility and safety so that he can return home safely with family and CNA's assist.    PT Recommendation/Assessment Patient will need skilled PT in the acute care venue  PT Problem List Decreased strength;Decreased activity tolerance;Decreased balance;Decreased mobility  PT Therapy Diagnosis  Difficulty walking;Abnormality of gait;Generalized weakness  PT Plan  PT Frequency Min 3X/week  PT Treatment/Interventions DME instruction;Gait training;Stair training;Functional mobility training;Therapeutic activities;Therapeutic exercise;Balance  training;Neuromuscular re-education;Patient/family education  PT Recommendation  Follow Up Recommendations Home health PT;Supervision/Assistance - 24 hour (they have used [facility]'s HH services in the past and would like to use them again) (spoke with daughter who says that they can handle him and she has three people who can help)  Equipment Recommended None recommended by PT  Individuals Consulted  Consulted and Agree with Results and Recommendations Patient;Family member/caregiver  Family Member Consulted daughter who is a CNA-also has help from daily aid and granddaughter who is a Lawyer as well  Acute Rehab PT Goals  PT Goal Formulation With patient/family  Time For Goal Achievement 2 weeks  Pt will go Supine/Side to Sit with modified independence;with HOB 0 degrees  PT Goal: Supine/Side to Sit - Progress Goal set today  Pt will go Sit to Supine/Side with min assist;with HOB 0 degrees  PT Goal: Sit to Supine/Side - Progress Goal set today  Pt will go Sit to Stand with min assist  PT Goal: Sit to Stand - Progress Goal set today  Pt will go Stand to Sit with min assist  PT Goal: Stand to Sit - Progress Goal set today  Pt will Transfer Bed to Chair/Chair to Bed with min assist  PT Transfer Goal: Bed to Chair/Chair to Bed - Progress Goal set today  Pt will Ambulate 51 - 150 feet;with min assist;with rolling walker  PT Goal: Ambulate - Progress Goal set today   Dmitriy Gair B. Lachrisha Ziebarth, PT, DPT [phone]

## 2011-12-11 LAB — CBC
HCT: 31.7 % — ABNORMAL LOW (ref 39.0–52.0)
MCHC: 30.9 g/dL (ref 30.0–36.0)
Platelets: 187 10*3/uL (ref 150–400)
RDW: 14.8 % (ref 11.5–15.5)

## 2011-12-11 LAB — BASIC METABOLIC PANEL
BUN: 39 mg/dL — ABNORMAL HIGH (ref 6–23)
Calcium: 8.2 mg/dL — ABNORMAL LOW (ref 8.4–10.5)
Chloride: 108 mEq/L (ref 96–112)
Creatinine, Ser: 2.25 mg/dL — ABNORMAL HIGH (ref 0.50–1.35)
GFR calc Af Amer: 29 mL/min — ABNORMAL LOW (ref >90)
GFR calc non Af Amer: 25 mL/min — ABNORMAL LOW (ref >90)

## 2011-12-11 MED ORDER — BIOTENE DRY MOUTH MT LIQD
15.0000 mL | Freq: Two times a day (BID) | OROMUCOSAL | Status: DC
  Administered 2011-12-11 – 2011-12-12 (×3): 15 mL via OROMUCOSAL

## 2011-12-11 MED ORDER — BISACODYL 10 MG RE SUPP
10.0000 mg | Freq: Once | RECTAL | Status: AC
  Administered 2011-12-11: 10 mg via RECTAL
  Filled 2011-12-11: qty 1

## 2011-12-11 MED ORDER — POLYETHYLENE GLYCOL 3350 17 G PO PACK
17.0000 g | PACK | Freq: Every day | ORAL | Status: DC
  Administered 2011-12-11 – 2011-12-12 (×2): 17 g via ORAL
  Filled 2011-12-11 (×2): qty 1

## 2011-12-11 NOTE — Progress Notes (Signed)
PROGRESS NOTE  Jesse Chandler WRU:[id number] DOB: Oct 29, 1923 DOA: 12/09/2011 PCP: [name], MD, MD Urologist: Dr. Marcine Matar  Brief narrative: Jesse Chandler is an 78 year old man with a past medical history of prostate cancer status post resection in 2002 who was admitted on 12/09/2011 with abdominal discomfort and obstructive uropathy. The patient apparently was seen by his urologist who advised him to come to the emergency department for evaluation, after placing a Foley catheter in draining 2 L of urine from his distended bladder.  Assessment/Plan: Principal Problem:  *Obstructive uropathy /  Acute kidney injury /  H/O prostate cancer The patient was seen by his urologist on 12/09/2011 and a Foley catheter was placed. His initial creatinine was 3.30, and with placement of the Foley and IV fluids, his creatinine has continued to show daily improvement. He is on Flomax. Dr. Retta Diones has been formally consulted.  We'll continue to hydrate and monitor renal function for recovery.  Last PSA was 10, according to the patient's daughter, who brought his records from [place] and are located in the shadow chart. Active Problems:  Hypertension We'll continue Diltiazem.  Hold Lasix given AKI.  UTI (urinary tract infection) Urine cultures were sent on 12/09/2011 and show no growth. The patient was empirically treated with Cipro. D/C Cipro.  Paroxysmal a-fib Continue diltiazem for rate control. He is not on Coumadin.  Pleural effusion, bilateral The patient has required thoracentesis in the past. He is also had chest tubes placed. Given that we are holding Lasix, we'll need to monitor closely for signs of respiratory compromise. Cautious hydration.  Hypothyroidism Continue home dose of Synthroid.    Code Status: Full code Family Communication: Jesse Chandler, Jesse Chandler Daughter [phone], spoke her by telephone. Disposition Plan: Home, when stable.  Consultants:  Dr. Marcine Matar,  Urology  Procedures:  EKG 12/09/11: SINUS RHYTHM ~ normal P axis, V-rate 50- 99 ATRIAL PREMATURE COMPLEX ~ SV complex w/ short R-R interval BORDERLINE AV CONDUCTION DELAY ~ PR >210, V-rate 50- 90 IVCD, CONSIDER ATYPICAL LBBB ~ QRSd>120, notch/slur R I aVL V5-6  Antibiotics:  Cipro 12/09/11 ---> 12/11/11   Subjective  Jesse Chandler did not remember me from yesterday.  He denies SOB, abdominal pain.   Objective    Interim History: Jesse Chandler was stable overnight.   Objective: Filed Vitals:   12/10/11 2138 12/10/11 2300 12/11/11 0611 12/11/11 0827  BP: 135/56  126/57   Pulse: 73 74 63   Temp: 98.6 F (37 C)  98.7 F (37.1 C)   TempSrc: Oral  Oral   Resp: 20  20   Height:      Weight:      SpO2: 96%  100% 97%    Intake/Output Summary (Last 24 hours) at 12/11/11 1308 Last data filed at 12/11/11 0834  Gross per 24 hour  Intake 2550.1 ml  Output   1500 ml  Net 1050.1 ml    Exam: Gen:  NAD Cardiovascular:  RRR, No M/R/G Respiratory: Crackles left base, diminished bilateral bases Gastrointestinal: Abdomen soft, NT/ND with normal active bowel sounds. Extremities: No C/E/C    Data Reviewed: Basic Metabolic Panel:  Lab 12/11/11 6578 12/10/11 0410 12/09/11 1837  NA 139 139 137  K 4.5 4.2 --  CL 108 106 102  CO2 24 25 24   GLUCOSE 82 91 138*  BUN 39* 47* 51*  CREATININE 2.25* 2.85* 3.30*  CALCIUM 8.2* 8.6 8.7  MG -- -- --  PHOS -- -- --   Liver Function Tests:  Lab 12/09/11  1837  AST 32  ALT 10  ALKPHOS 62  BILITOT 0.3  PROT 7.3  ALBUMIN 3.0*   CBC:  Lab 12/11/11 0400 12/10/11 2200 12/09/11 1836  WBC 9.4 11.0* 14.1*  NEUTROABS -- -- 11.2*  HGB 9.8* 10.0* 10.8*  HCT 31.7* 32.7* 34.9*  MCV 88.8 88.4 88.1  PLT 187 183 172   Cardiac Enzymes:  Lab 12/09/11 1837  CKTOTAL --  CKMB --  CKMBINDEX --  TROPONINI <0.30     Ref. Range 12/09/2011 18:35 12/09/2011 18:36  Lactic Acid, Venous Latest Range: 0.5-2.2 mmol/L 1.8   Procalcitonin No range found   0.92   Results for orders placed during the hospital encounter of 12/09/11  URINE CULTURE     Status: Normal   Collection Time   12/09/11  6:33 PM      Component Value Range Status Comment   Specimen Description URINE, RANDOM   Final    Special Requests NONE   Final    Culture  Setup Time [id number]   Final    Colony Count NO GROWTH   Final    Culture NO GROWTH   Final    Report Status 12/10/2011 FINAL   Final     Studies: Dg Chest 2 View  12/09/2011  *RADIOLOGY REPORT*  Clinical Data: Short of breath  CHEST - 2 VIEW  Comparison: 09/07/2011  Findings: Mild cardiomegaly.  Bilateral pleural effusions right greater than left.  Bibasilar opacities.  No pneumothorax.  Stable thoracic spine. Left PICC has been removed.  IMPRESSION: Bilateral pleural effusions and bibasilar opacities right greater than left stable.  Original Report Authenticated By: Donavan Burnet, M.D.    Scheduled Meds:    . ipratropium  0.5 mg Nebulization TID   And  . albuterol  2.5 mg Nebulization TID  . antiseptic oral rinse  15 mL Mouth Rinse BID  . calcium-vitamin D  1 tablet Oral BID  . ciprofloxacin  200 mg Intravenous Q12H  . diltiazem  240 mg Oral Daily  . docusate sodium  100 mg Oral Daily  . enoxaparin  40 mg Subcutaneous Q24H  . ferrous fumarate  1 tablet Oral BID  . folic acid  1 mg Oral Daily  . levothyroxine  50 mcg Oral Q breakfast  . pantoprazole  80 mg Oral Q1200  . senna  1 tablet Oral Daily  . Tamsulosin HCl  0.4 mg Oral Daily  . DISCONTD: albuterol  2.5 mg Nebulization Q4H  . DISCONTD: ipratropium  0.5 mg Nebulization Q4H   Continuous Infusions:    . sodium chloride 1,000 mL (12/10/11 2301)      LOS: 2 days   Hillery Aldo, MD Pager [phone]  12/11/2011, 9:23 AM

## 2011-12-11 NOTE — Consult Note (Signed)
Urology Consult  OZ:HYQMVHQ retention  HPI: 78 year old male who I have known for several years, and was treated primarily for locally aggressive prostate cancer with orchiectomy. He presented to my office 2 days ago in urinary retention, but has a history of pleural effusions, status post thoracentesis, and over the past several days prior to his visit with me, had barely been able to get around. He also has had no urinary output. Foley catheter was placed, and 1800 cc of urine was produced. As the patient could not even walk at that time, I recommended that they go to the emergency room for further evaluation. Urine culture was sent from the urine obtained in my office, and recently returned no growth.  The patient did have a prior history of recurring urinary tract infections. He was evaluated in late 2011 for urinary obstruction, but never really followed up. He does have a history of incomplete emptying of the bladder.  PMH: Past Medical History  Diagnosis Date  . Prostate cancer   . Diabetes mellitus   . Hypertension   . Hypothyroidism   . Psychosis   . Anemia of chronic disease     s/p tx with erythropoietin and iron  . CKD (chronic kidney disease) stage 3, GFR 30-59 ml/min   . Posttraumatic stress disorder   . Dementia   . PAF (paroxysmal atrial fibrillation)   . Recurrent right pleural effusion     H/O pleural catheter    PSH: Past Surgical History  Procedure Date  . Orchiectomy     bilateral  . Subdural hematoma evacuation via craniotomy   . Cholecystectomy     Allergies: No Known Allergies  Medications: Prescriptions prior to admission  Medication Sig Dispense Refill  . calcium-vitamin D (OSCAL WITH D) 500-200 MG-UNIT per tablet Take 1 tablet by mouth 2 (two) times daily.      Marland Kitchen diltiazem (DILACOR XR) 240 MG 24 hr capsule Take 240 mg by mouth daily.      Marland Kitchen docusate sodium (COLACE) 100 MG capsule Take 100 mg by mouth daily.      Marland Kitchen esomeprazole (NEXIUM) 40 MG  capsule Take 40 mg by mouth daily.      . ferrous fumarate (HEMOCYTE - 106 MG FE) 325 (106 FE) MG TABS Take 1 tablet by mouth 2 (two) times daily.      . folic acid (FOLVITE) 1 MG tablet Take 1 mg by mouth daily.      . furosemide (LASIX) 20 MG tablet Take 20 mg by mouth daily.      Marland Kitchen ipratropium-albuterol (DUONEB) 0.5-2.5 (3) MG/3ML SOLN Take 3 mLs by nebulization every 6 (six) hours as needed. shorntess of breath/ wheezing      . levothyroxine (SYNTHROID, LEVOTHROID) 50 MCG tablet Take 50 mcg by mouth daily.      . polyethylene glycol (MIRALAX / GLYCOLAX) packet Take 17 g by mouth daily as needed. constipation      . senna (SENOKOT) 8.6 MG tablet Take 1 tablet by mouth daily.      Marland Kitchen sulfamethoxazole-trimethoprim (BACTRIM DS,SEPTRA DS) 800-160 MG per tablet Take 1 tablet by mouth 2 (two) times daily.      . Tamsulosin HCl (FLOMAX) 0.4 MG CAPS Take 0.4 mg by mouth daily.         Social History: History   Social History  . Marital Status: Married    Spouse Name: N/A    Number of Children: N/A  . Years of Education: N/A  Occupational History  . Not on file.   Social History Main Topics  . Smoking status: Never Smoker   . Smokeless tobacco: Never Used  . Alcohol Use: Yes     occasional - twice a year  . Drug Use: No  . Sexually Active:    Other Topics Concern  . Not on file   Social History Narrative  . No narrative on file    Family History: History reviewed. No pertinent family history.  Review of Systems: Positive: low urinary output, urinary retention, shortness of breath, swollen legs, weakness, fatigue. Negative:   A further 10 point review of systems was negative except what is listed in the HPI.  Physical Exam: @VITALS2 @ General: No acute distress.  Awake. Rectal: No prostatic nodularity.  Studies:  Recent Labs  Basename 12/11/11 0400 12/10/11 2200   HGB 9.8* 10.0*   WBC 9.4 11.0*   PLT 187 183    Recent Labs  Basename 12/11/11 0400 12/10/11 0410    NA 139 139   K 4.5 4.2   CL 108 106   CO2 24 25   BUN 39* 47*   CREATININE 2.25* 2.85*   CALCIUM 8.2* 8.6   GFRNONAA 25* 19*   GFRAA 29* 22*     No results found for this basename: PT:2,INR:2,APTT:2 in the last 72 hours   No components found with this basename: ABG:2    Assessment:  1. Prostate cancer, years out from bilateral orchiectomy as primary therapy. He has had a normal DEXA scan within the past 2-3 years. PSA by report his only approximately 10, which is up 1.5 from his last PSA performed in late 2011  2. Urinary retention. I feel the patient needs a long-term Foley catheter  Plan: 1. I've recommended that he leave the catheter in long-term  2. I will have the nurses on the floor taste the patient how to change from a leg bag to a bedside bag  3. I will set up followup for the patient in the office.    [phone]

## 2011-12-11 NOTE — Progress Notes (Signed)
Spoke with daughter Barron Alvine over the telephone regarding patient's foley catheter and the plans to discharge home with it in place.  Daughter states she is familiar with this catheter and knows how to change between standard and leg bag from previous times that the patient has had this.  Does not have any questions regarding this at this time.

## 2011-12-11 NOTE — Progress Notes (Signed)
Spoke with patient and granddaughter at bedside. Patient states he lives at home with his dtr, granddaughter is a Consulting civil engineer at [employer], both are CNA"s and assist in his care. Patient also states he has aide that comes in for 5h 5 days per week to help. Patient states plan currently is to d/c home tomorrow with foley in place. Patient has used [facility] in the past and would like to use them again for [facility] services, he does not have any DME needs. Contacted [facility], they will be able to see him on Monday for RN/PT. Currently awaiting orders and they are to be faxed to [facility] at intake at [phone]. Will continue to follow as needed.

## 2011-12-11 NOTE — Progress Notes (Addendum)
Reviewed the process for applying and removing the leg bag for patients foley catheter, with patient and granddaughter.  Reviewed importance of changing from leg bag to regular bag when laying down to reduce backflow of urine into bladder.  Reviewed instructions for emptying leg bag.  Patient's granddaughter demonstrated removing the standard leg bag and applying and removing the leg bag.  Patient and granddaughter stated they understood instructions and had no questions at this time.  Encouraged patient and granddaughter to ask any questions that may arise.

## 2011-12-12 ENCOUNTER — Encounter (HOSPITAL_COMMUNITY): Payer: Self-pay | Admitting: Internal Medicine

## 2011-12-12 LAB — BASIC METABOLIC PANEL
BUN: 31 mg/dL — ABNORMAL HIGH (ref 6–23)
GFR calc Af Amer: 37 mL/min — ABNORMAL LOW (ref >90)
GFR calc non Af Amer: 32 mL/min — ABNORMAL LOW (ref >90)
Potassium: 4.9 mEq/L (ref 3.5–5.1)
Sodium: 138 mEq/L (ref 135–145)

## 2011-12-12 MED ORDER — FLEET ENEMA 7-19 GM/118ML RE ENEM
1.0000 | ENEMA | Freq: Once | RECTAL | Status: AC
  Administered 2011-12-12: 1 via RECTAL
  Filled 2011-12-12: qty 1

## 2011-12-12 NOTE — Discharge Summary (Addendum)
Physician Discharge Summary  Patient ID: Jesse Chandler MRN: [id number] DOB/AGE: Aug 24, 1924 78 y.o.  Admit date: 12/09/2011 Discharge date: 12/12/2011  Primary Care Physician:  Jesse Rich, MD, MD Urologist:  Dr. Marcine Chandler   Discharge Diagnoses:    Present on Admission:  .Hypertension .Obstructive uropathy .UTI (urinary tract infection) .Paroxysmal a-fib .Pleural effusion, bilateral .Hypothyroidism .Acute kidney injury  Discharge Medications:  Medication List  As of 12/12/2011  9:43 AM   STOP taking these medications         sulfamethoxazole-trimethoprim 800-160 MG per tablet         TAKE these medications         calcium-vitamin D 500-200 MG-UNIT per tablet   Commonly known as: OSCAL WITH D   Take 1 tablet by mouth 2 (two) times daily.      diltiazem 240 MG 24 hr capsule   Commonly known as: DILACOR XR   Take 240 mg by mouth daily.      docusate sodium 100 MG capsule   Commonly known as: COLACE   Take 100 mg by mouth daily.      esomeprazole 40 MG capsule   Commonly known as: NEXIUM   Take 40 mg by mouth daily.      ferrous fumarate 325 (106 FE) MG Tabs   Commonly known as: HEMOCYTE - 106 mg FE   Take 1 tablet by mouth 2 (two) times daily.      folic acid 1 MG tablet   Commonly known as: FOLVITE   Take 1 mg by mouth daily.      furosemide 20 MG tablet   Commonly known as: LASIX   Take 20 mg by mouth daily.      ipratropium-albuterol 0.5-2.5 (3) MG/3ML Soln   Commonly known as: DUONEB   Take 3 mLs by nebulization every 6 (six) hours as needed. shorntess of breath/ wheezing      levothyroxine 50 MCG tablet   Commonly known as: SYNTHROID, LEVOTHROID   Take 50 mcg by mouth daily.      polyethylene glycol packet   Commonly known as: MIRALAX / GLYCOLAX   Take 17 g by mouth daily as needed. constipation      senna 8.6 MG tablet   Commonly known as: SENOKOT   Take 1 tablet by mouth daily.      Tamsulosin HCl 0.4 MG Caps   Commonly known as:  FLOMAX   Take 0.4 mg by mouth daily.             Disposition and Follow-up: The patient is being discharged home.  Dr. Lenoria Chandler office will call with a follow up appointment time.   Medical Consults:  Dr. Marcine Chandler, Urology  Other Consults:  Physical Therapy: 24 hour supervision recommended.    Significant Diagnostic Studies:    Dg Chest 2 View 12/09/2011  *RADIOLOGY REPORT*  Clinical Data: Short of breath  CHEST - 2 VIEW  Comparison: 09/07/2011  Findings: Mild cardiomegaly.  Bilateral pleural effusions right greater than left.  Bibasilar opacities.  No pneumothorax.  Stable thoracic spine. Left PICC has been removed.  IMPRESSION: Bilateral pleural effusions and bibasilar opacities right greater than left stable.  Original Report Authenticated By: Jesse Chandler, M.D.    Discharge Laboratory Values: Basic Metabolic Panel:  Lab 12/12/11 4098 12/11/11 0400 12/10/11 0410 12/09/11 1837  NA 138 139 139 137  K 4.9 4.5 -- --  CL 107 108 106 102  CO2 25 24 25 24   GLUCOSE  109* 82 91 138*  BUN 31* 39* 47* 51*  CREATININE 1.83* 2.25* 2.85* 3.30*  CALCIUM 8.2* 8.2* 8.6 8.7  MG -- -- -- --  PHOS -- -- -- --   GFR Estimated Creatinine Clearance: 37.4 ml/min (by C-G formula based on Cr of 1.83). Liver Function Tests:  Lab 12/09/11 1837  AST 32  ALT 10  ALKPHOS 62  BILITOT 0.3  PROT 7.3  ALBUMIN 3.0*    CBC:  Lab 12/11/11 0400 12/10/11 2200 12/09/11 1836  WBC 9.4 11.0* 14.1*  NEUTROABS -- -- 11.2*  HGB 9.8* 10.0* 10.8*  HCT 31.7* 32.7* 34.9*  MCV 88.8 88.4 88.1  PLT 187 183 172   Cardiac Enzymes:  Lab 12/09/11 1837  CKTOTAL --  CKMB --  CKMBINDEX --  TROPONINI <0.30   CBG:  Lab 12/10/11 2323  GLUCAP 87   Microbiology Recent Results (from the past 240 hour(s))  URINE CULTURE     Status: Normal   Collection Time   12/09/11  6:33 PM      Component Value Range Status Comment   Specimen Description URINE, RANDOM   Final    Special Requests NONE    Final    Culture  Setup Time [id number]   Final    Colony Count NO GROWTH   Final    Culture NO GROWTH   Final    Report Status 12/10/2011 FINAL   Final      Brief H and P: For complete details please refer to admission H and P, but in brief, Jesse Chandler is an 78 year old man with a past medical history of prostate cancer status post resection in 2002 who was admitted on 12/09/2011 with abdominal discomfort and obstructive uropathy. The patient was seen by his urologist who advised him to come to the emergency department for evaluation, after placing a Foley catheter in draining 2 L of urine from his distended bladder.    Physical Exam at Discharge: BP 151/66  Pulse 67  Temp(Src) 98.5 F (36.9 C) (Oral)  Resp 18  Ht 6\' 1"  (1.854 m)  Wt 108.183 kg (238 lb 8 oz)  BMI 31.47 kg/m2  SpO2 98% Gen:  NAD Cardiovascular:  RRR, No M/R/G Respiratory: Lungs CTAB Gastrointestinal: Abdomen soft, NT/ND with normal active bowel sounds. Extremities: No C/E/C  Hospital Course:  Principal Problem:  *Obstructive uropathy / Acute kidney injury / H/O prostate cancer  The patient was seen by his urologist on 12/09/2011 and a Foley catheter was placed. His initial creatinine was 3.30, and with placement of the Foley and IV fluids, his creatinine has continued to show daily improvement, and is 1.8 upon discharge (baseline creatinine 1.7). He is on Flomax. Dr. Retta Chandler saw the patient in consultation on 12/10/11, and recommended a long term catheter. Dr. Lenoria Chandler office will call with a follow up appointment time. Active Problems:  Hypertension  Patient's Lasix was held and he was continued on his usual home dose of diltiazem. He can safely resume Lasix now that his kidney function is back to baseline. UTI (urinary tract infection)  Urine cultures were sent on 12/09/2011 and showed no growth. The patient was empirically treated with Cipro, which we discontinued after urine cultures were finalized.  Patient's urologist does not recommend on going antimicrobial therapy at this time..  Paroxysmal a-fib  Patient remains rate controlled on diltiazem. He is not on Coumadin at baseline. Pleural effusion, bilateral  The patient has required thoracentesis in the past. He is also had chest  tubes placed. Patient had no evidence of respiratory compromise while in the hospital.  Hypothyroidism  The patient was maintained on his usual home dose of Synthroid.       Recommendations for hospital follow-up: 1.  Followup with urologist regarding long-term management of obstructive uropathy  Diet:  Heart healthy  Activity:  Increase activity slowly  Condition at Discharge:   Improved  Time spent on Discharge:  35 minutes  Signed: Dr. Trula Ore Dmarius Reeder Pager [phone] 12/12/2011, 9:43 AM

## 2011-12-12 NOTE — Progress Notes (Signed)
DC summary, MD orders, and demographics faxed to [facility] at [phone]. Pt agrees with d/c plan with daughter at bedside.  Jesse Chandler [phone]

## 2011-12-12 NOTE — Progress Notes (Signed)
Patient being discharged to home with home health Rn and home health Pt. Pt and patient's daughter educated on follow-up appointments and medications. Questions were answered. Will be taken to car in a wheelchair.

## 2011-12-12 NOTE — Discharge Instructions (Signed)
Acute Urinary Retention, Male You have been seen by a caregiver today because of your inability to urinate (pass your water). This is a common problem in elderly males. As men age their prostates become larger and block the flow of urine from the bladder. This is usually a problem that has come on gradually. It is often first noticed by having to get up at night to urinate. This is because as the prostate enlarges it is more difficult to empty the bladder completely. Treatment may involve a one time catheterization to empty the bladder. This is putting in a tube to drain your urine. Then you and your personal caregiver can decide at your earliest convenience how to handle this problem in the future. It may also be a problem that may not recur for years. Sometimes this problem can be caused by medications. In this case, all that is often necessary is to discontinue the offending agent. If you are to leave the foley catheter (a long, narrow, hollow tube) in and go home with a drainage system, you will need to discuss the best course of action with your caregiver. While the catheter is in, maintain a good intake of fluids. Keep the drainage bag emptied and lower than your catheter. This is so contaminated (infected) urine will not be flowing back into your bladder. This could lead to a urinary tract infection. Only take over-the-counter or prescription medicines for pain, discomfort, or fever as directed by your caregiver.  SEEK IMMEDIATE MEDICAL CARE IF:  You develop chills, fever, or show signs of generalized illness that occurs prior to seeing your caregiver. Document Released: 12/21/2000 Document Revised: 09/03/2011 Document Reviewed: 09/05/2008 [employer] Patient Information 2012 [employer]. 

## 2012-01-26 ENCOUNTER — Other Ambulatory Visit: Payer: Self-pay | Admitting: Urology

## 2012-01-26 DIAGNOSIS — C61 Malignant neoplasm of prostate: Secondary | ICD-10-CM

## 2012-02-01 ENCOUNTER — Encounter (HOSPITAL_COMMUNITY)
Admission: RE | Admit: 2012-02-01 | Discharge: 2012-02-01 | Disposition: A | Discharge status: Home / Self Care | Payer: Medicare Other | Source: Ambulatory Visit | Attending: Urology | Admitting: Urology

## 2012-02-01 DIAGNOSIS — C61 Malignant neoplasm of prostate: Secondary | ICD-10-CM | POA: Insufficient documentation

## 2012-02-01 MED ORDER — TECHNETIUM TC 99M MEDRONATE IV KIT
25.0000 | PACK | Freq: Once | INTRAVENOUS | Status: AC | PRN
  Administered 2012-02-01: 25 via INTRAVENOUS

## 2012-05-31 ENCOUNTER — Encounter (HOSPITAL_BASED_OUTPATIENT_CLINIC_OR_DEPARTMENT_OTHER): Payer: Medicare Other | Attending: General Surgery

## 2012-05-31 DIAGNOSIS — I10 Essential (primary) hypertension: Secondary | ICD-10-CM | POA: Insufficient documentation

## 2012-05-31 DIAGNOSIS — L89609 Pressure ulcer of unspecified heel, unspecified stage: Secondary | ICD-10-CM | POA: Insufficient documentation

## 2012-05-31 DIAGNOSIS — E039 Hypothyroidism, unspecified: Secondary | ICD-10-CM | POA: Insufficient documentation

## 2012-05-31 DIAGNOSIS — E119 Type 2 diabetes mellitus without complications: Secondary | ICD-10-CM | POA: Insufficient documentation

## 2012-05-31 DIAGNOSIS — C7951 Secondary malignant neoplasm of bone: Secondary | ICD-10-CM | POA: Insufficient documentation

## 2012-05-31 DIAGNOSIS — I509 Heart failure, unspecified: Secondary | ICD-10-CM | POA: Insufficient documentation

## 2012-05-31 DIAGNOSIS — I4891 Unspecified atrial fibrillation: Secondary | ICD-10-CM | POA: Insufficient documentation

## 2012-05-31 DIAGNOSIS — Z79899 Other long term (current) drug therapy: Secondary | ICD-10-CM | POA: Insufficient documentation

## 2012-05-31 DIAGNOSIS — Z8546 Personal history of malignant neoplasm of prostate: Secondary | ICD-10-CM | POA: Insufficient documentation

## 2012-05-31 DIAGNOSIS — L8990 Pressure ulcer of unspecified site, unspecified stage: Secondary | ICD-10-CM | POA: Insufficient documentation

## 2012-05-31 NOTE — H&P (Signed)
Jesse Chandler, WIETING                ACCOUNT NO.:  [id number]  MEDICAL RECORD NO.:  [id number]  LOCATION:  FOOT                         FACILITY:  [facility]  PHYSICIAN:  Joanne Gavel, M.D.        DATE OF BIRTH:  [birth year]  DATE OF ADMISSION:  05/31/2012 DATE OF DISCHARGE:                             HISTORY & PHYSICAL   CHIEF COMPLAINT:  Wound, left heel.  HISTORY OF PRESENT ILLNESS:  This is an 78 year old male, essentially bed ridden, has a hospital bed at home with an air mattress who developed a wound on his left heel approximately 5-6 weeks ago.  This was first a blister, then  eventuated into a black patch.  He has been treated with ointments and dressing changes.  PAST MEDICAL HISTORY:  Significant for cancer of the prostate with several bone mets, obstructive uropathy with indwelling Foley catheter, hypertension, paroxysmally atrial fibrillation, hypothyroidism, diabetes type 2, chronic recurrent pleural effusions, neuropathy, congestive heart failure.  PAST SURGICAL HISTORY:  Cholecystectomy, thoracentesis.  Cigarettes none.  Alcohol none.  MEDICATIONS:  Dilacor, Nexium, hemocyte, Lasix, DuoNeb, Synthroid, MiraLax, Casodex daily, aspirin.  ALLERGIES:  None.  REVIEW OF SYSTEMS:  Essentially as above.  PHYSICAL EXAMINATION:  GENERAL APPEARANCE:  A somewhat disoriented, pleasant male in no distress. VITAL SIGNS:  Temperature 97.7, pulse 62, respirations 16, blood pressure 132/72.  Glucose was 84. HEAD, EYES, EARS, NOSE, THROAT:  No sign of asymmetry. CHEST:  Clear. HEART:  Regular rhythm. ABDOMEN:  Not examined.  There is an indwelling Foley catheter. EXTREMITIES:  Examination of lower extremities reveals peripheral pulses are not palpable although ABI was measured on the left is 1.14.  There is swelling of both feet, left greater than right, particularly at the medial ankle and left lateral heel, there is a 5.5 x 3.3 patch of full- thickness eschar which is tightly  adherent.  IMPRESSION:  Multiple medical problems in a patient with a decubitus ulcer of the left heel.  PLAN OF TREATMENT:  Start with Santyl and Hydrogel.  The ulcer is too adherent to debride at present.  Get x-rays of foot.  Get vascular studies.  We will see him in 7 days.     Joanne Gavel, M.D.     RA/MEDQ  D:  05/31/2012  T:  05/31/2012  Job:  [id number]

## 2012-06-06 ENCOUNTER — Other Ambulatory Visit (HOSPITAL_BASED_OUTPATIENT_CLINIC_OR_DEPARTMENT_OTHER): Payer: Self-pay | Admitting: Internal Medicine

## 2012-06-21 DIAGNOSIS — Z22322 Carrier or suspected carrier of Methicillin resistant Staphylococcus aureus: Secondary | ICD-10-CM

## 2012-06-21 HISTORY — DX: Carrier or suspected carrier of methicillin resistant Staphylococcus aureus: Z22.322

## 2012-06-21 LAB — GLUCOSE, CAPILLARY: Glucose-Capillary: 121 mg/dL — ABNORMAL HIGH (ref 70–99)

## 2012-06-28 ENCOUNTER — Encounter (HOSPITAL_BASED_OUTPATIENT_CLINIC_OR_DEPARTMENT_OTHER): Payer: Medicare Other | Attending: General Surgery

## 2012-06-28 DIAGNOSIS — L89609 Pressure ulcer of unspecified heel, unspecified stage: Secondary | ICD-10-CM | POA: Insufficient documentation

## 2012-06-28 DIAGNOSIS — L8995 Pressure ulcer of unspecified site, unstageable: Secondary | ICD-10-CM | POA: Insufficient documentation

## 2012-07-13 ENCOUNTER — Ambulatory Visit (INDEPENDENT_AMBULATORY_CARE_PROVIDER_SITE_OTHER): Payer: Medicare Other | Admitting: Infectious Disease

## 2012-07-13 ENCOUNTER — Ambulatory Visit
Admission: RE | Admit: 2012-07-13 | Discharge: 2012-07-13 | Disposition: A | Discharge status: Home / Self Care | Payer: Medicare Other | Source: Ambulatory Visit | Attending: Infectious Disease | Admitting: Infectious Disease

## 2012-07-13 ENCOUNTER — Encounter: Payer: Self-pay | Admitting: Infectious Disease

## 2012-07-13 VITALS — BP 109/56 | HR 56 | Temp 97.4°F

## 2012-07-13 DIAGNOSIS — M869 Osteomyelitis, unspecified: Secondary | ICD-10-CM

## 2012-07-13 DIAGNOSIS — Z1624 Resistance to multiple antibiotics: Secondary | ICD-10-CM | POA: Insufficient documentation

## 2012-07-13 DIAGNOSIS — A499 Bacterial infection, unspecified: Secondary | ICD-10-CM | POA: Insufficient documentation

## 2012-07-13 DIAGNOSIS — E1169 Type 2 diabetes mellitus with other specified complication: Secondary | ICD-10-CM

## 2012-07-13 DIAGNOSIS — A4902 Methicillin resistant Staphylococcus aureus infection, unspecified site: Secondary | ICD-10-CM | POA: Insufficient documentation

## 2012-07-13 DIAGNOSIS — E11621 Type 2 diabetes mellitus with foot ulcer: Secondary | ICD-10-CM

## 2012-07-13 DIAGNOSIS — L97509 Non-pressure chronic ulcer of other part of unspecified foot with unspecified severity: Secondary | ICD-10-CM

## 2012-07-13 DIAGNOSIS — B9689 Other specified bacterial agents as the cause of diseases classified elsewhere: Secondary | ICD-10-CM

## 2012-07-13 MED ORDER — AMOXICILLIN-POT CLAVULANATE 500-125 MG PO TABS: 1.0000 | ORAL_TABLET | Freq: Two times a day (BID) | ORAL | Status: DC

## 2012-07-13 MED ORDER — DOXYCYCLINE HYCLATE 100 MG PO TABS: 100.0000 mg | ORAL_TABLET | Freq: Two times a day (BID) | ORAL | Status: DC

## 2012-07-13 NOTE — Progress Notes (Addendum)
Subjective:    Patient ID: Jesse Chandler, male    DOB: [birth year], 78 y.o.   MRN: [id number]  HPI  78 year old [place] American man with multiple medical problems including diabetes dementia strokes, chronic kidney disease who is bedbound. He has developed an ulcer on his left heel due to sliding in his bed indicating into a pump. This ulcer developed in July and the patient has been seen at a wound care in [place] and ultimately by Dr. Wiliam Ke here at the wound center here at [facility]. Local care and recent debridement by Dr. Wiliam Ke. Some cultures were obtained in the left heel blood no other superficial or deep cultures. These were taken on September 24 and yielded methicillin resistant staph aureus along with Acinetobacter  Complex.  Susceptibilities of methicillin-resistant staph aureus showed it to be sensitive to Bactrim vancomycin with MIC of 11 nasal lid rifampin and tetracycline. The Acinetobacter bacteria complex was sensitive to ampicillin sulbactam with MIC of less than or equal to 2 sensitive to imipenem with MIC of less than or equal to 0.25 it had intermediate sensitivity ceftriaxone at 16 and has sensitivities to ceftaz edema and MIC of 4 to cefepime with MIC of 2 to gentamicin with MIC of less than or equal to 1 and tobramycin less than equal to 1 was resistant to ciprofloxacin intermediate to levofloxacin and resistant to Bactrim  Labs from [facility] showed recent Chemistries with BUN of 23 and creatinine of 1.50 normal electrolytes white blood cell count was normal at 9 hemoglobin 9.9 platelets 273    Patient was supposed to be given both doxycycline and Augmentin but has remained Augmentin alone. He is accompanied today by his daughter feel the wound is improving but still is rather deep. We're consulted to assist in management of his workup of this patient with deep wound and even possible osteomyelitis.  I spent greater than 60 minutes with the patient including greater  than 50% of time in face to face counsel of the patient and in coordination of their care.    Review of Systems  Constitutional: Positive for fatigue. Negative for fever, chills, diaphoresis, activity change, appetite change and unexpected weight change.  HENT: Negative for congestion, sore throat, rhinorrhea, sneezing, trouble swallowing and sinus pressure.   Eyes: Negative for photophobia and visual disturbance.  Respiratory: Negative for cough, chest tightness, shortness of breath, wheezing and stridor.   Cardiovascular: Negative for chest pain, palpitations and leg swelling.  Gastrointestinal: Negative for nausea, vomiting, abdominal pain, diarrhea, constipation, blood in stool, abdominal distention and anal bleeding.  Genitourinary: Negative for dysuria, hematuria, flank pain and difficulty urinating.  Musculoskeletal: Negative for myalgias, back pain, joint swelling, arthralgias and gait problem.  Skin: Positive for wound. Negative for color change, pallor and rash.  Neurological: Positive for weakness. Negative for dizziness and light-headedness.  Hematological: Negative for adenopathy. Does not bruise/bleed easily.  Psychiatric/Behavioral: Positive for confusion. Negative for behavioral problems, disturbed wake/sleep cycle, dysphoric mood, decreased concentration and agitation.       Objective:   Physical Exam  Constitutional: He is oriented to person, place, and time. He appears well-nourished. No distress.  HENT:  Head: Normocephalic and atraumatic.  Mouth/Throat: No oropharyngeal exudate.  Eyes: EOM are normal. No scleral icterus.  Neck: Normal range of motion. Neck supple.  Cardiovascular: Normal rate and regular rhythm.   Pulmonary/Chest: Effort normal and breath sounds normal. No respiratory distress.  Abdominal: He exhibits no distension. There is no tenderness.  Musculoskeletal: He  exhibits edema. He exhibits no tenderness.       Feet:  Neurological: He is alert and  oriented to person, place, and time. He has normal reflexes.  Skin: Skin is warm and dry. He is not diaphoretic.  Psychiatric: He has a normal mood and affect. His behavior is normal. Judgment and thought content normal.          Assessment & Plan:  #1: Diabetic foot/pressure ulcer: I'll obtain plain films to see is that if there is any evidence of osteomyelitis all check a sedimentation rate and C-reactive protein. I will contemplate imaging with an MRI. As far as the organisms were isolated from his wound cultures not clear that either is a pathogen although MRSA is a high probability suspect pathogen. For now I will place him on doxycycline twice daily and continue his Augmentin at 500/125 mg twice daily taking into account his fluctuating renal function. Note the Augmentin may not be very reliably active against his Acinetobacter. The Acinetobacter was listed as being sensitive to ampicillin sulbactam. However 3 colitis sulbactam component is part of what is active against this organism. I don't think that amoxicillin clavulanic acid is equivalent therapeutically to ampicillin sulbactam in this specific scenario. However again we don't know that the Acinetobacter is a true pathogen therefore I will continue him with the current coverage of Augmentin along with added doxycycline. Should his imaging show evidence of osteomyelitis we'll consider referral to a surgeon for possible debridement and bone biopsy for cultures off antibiotics.  #2 possible osteomyelitis see above discussion.  #3 MRSA colonization versus infection: This see above discussion Will target this. #4 Acinetobacter: Not sure if it is a true pathogen Augmentin may or may not have activity here . Early should his wound worsen and we need to go brought her on empiric therapy OD to go to an IV agent for this.  #5 chronic kidney disease most recent creatinine was 1.5. As mentioned above we'll dose his Augmentin for presumed  fluctuating renal fxn

## 2012-07-13 NOTE — Patient Instructions (Signed)
I need to get blood work today I need to obtain an xray of your heel We may decide to check an MRI of the heel as well  Please make fu appt with me in 2-3 weeks time

## 2012-07-14 ENCOUNTER — Telehealth: Payer: Self-pay | Admitting: Infectious Disease

## 2012-07-14 DIAGNOSIS — M869 Osteomyelitis, unspecified: Secondary | ICD-10-CM

## 2012-07-14 NOTE — Telephone Encounter (Signed)
Jesse Chandler this guy's MRI shows evidence of osteomyelitis on XRay but radiologists are hedging. They suggested MRI.  Can we set up MRI without contrast of foot. Then if that is even more worrisome, we can set him up with a surgeon for consideration of debridement, bone biopsy or even amputation--they will probably want to try abx first.

## 2012-07-27 ENCOUNTER — Ambulatory Visit (INDEPENDENT_AMBULATORY_CARE_PROVIDER_SITE_OTHER): Payer: Medicare Other | Admitting: Infectious Disease

## 2012-07-27 ENCOUNTER — Encounter: Payer: Self-pay | Admitting: Infectious Disease

## 2012-07-27 VITALS — BP 97/60 | HR 63 | Temp 97.9°F

## 2012-07-27 DIAGNOSIS — A4902 Methicillin resistant Staphylococcus aureus infection, unspecified site: Secondary | ICD-10-CM

## 2012-07-27 DIAGNOSIS — Z2239 Carrier of other specified bacterial diseases: Secondary | ICD-10-CM

## 2012-07-27 DIAGNOSIS — M869 Osteomyelitis, unspecified: Secondary | ICD-10-CM

## 2012-07-27 DIAGNOSIS — Z23 Encounter for immunization: Secondary | ICD-10-CM

## 2012-07-27 DIAGNOSIS — Z1630 Resistance to unspecified antimicrobial drugs: Secondary | ICD-10-CM

## 2012-07-27 LAB — BASIC METABOLIC PANEL WITH GFR
BUN: 15 mg/dL (ref 6–23)
Chloride: 103 mEq/L (ref 96–112)
Creat: 1.45 mg/dL — ABNORMAL HIGH (ref 0.50–1.35)
GFR, Est Non African American: 43 mL/min — ABNORMAL LOW

## 2012-07-27 LAB — CBC WITH DIFFERENTIAL/PLATELET
Basophils Absolute: 0 10*3/uL (ref 0.0–0.1)
Basophils Relative: 0 % (ref 0–1)
Eosinophils Absolute: 0.4 10*3/uL (ref 0.0–0.7)
Eosinophils Relative: 6 % — ABNORMAL HIGH (ref 0–5)
HCT: 32.9 % — ABNORMAL LOW (ref 39.0–52.0)
MCH: 26.8 pg (ref 26.0–34.0)
MCHC: 31.9 g/dL (ref 30.0–36.0)
Monocytes Absolute: 0.5 10*3/uL (ref 0.1–1.0)
Neutro Abs: 4.4 10*3/uL (ref 1.7–7.7)
RDW: 16.9 % — ABNORMAL HIGH (ref 11.5–15.5)

## 2012-07-27 LAB — SEDIMENTATION RATE: Sed Rate: 69 mm/hr — ABNORMAL HIGH (ref 0–16)

## 2012-07-27 NOTE — Patient Instructions (Addendum)
Ok to [employer] for am of the 22nd

## 2012-07-27 NOTE — Progress Notes (Signed)
Subjective:    Patient ID: Jesse Chandler, male    DOB: 17-Sep-1924, 78 y.o.   MRN: [id number]  HPI  78 year old [place] American man with multiple medical problems including diabetes dementia strokes, chronic kidney disease who is bedbound. He has developed an ulcer on his left heel due to sliding in his bed indicating into a pump. This ulcer developed in July and the patient has been seen at a wound care in [place] and ultimately by Dr. Wiliam Ke here at the wound center here at [facility]. Local care and recent debridement by Dr. Wiliam Ke. Some cultures were obtained in the left heel blood no other superficial or deep cultures. These were taken on September 24 and yielded methicillin resistant staph aureus along with Acinetobacter Complex.  Susceptibilities of methicillin-resistant staph aureus showed it to be sensitive to Bactrim vancomycin with MIC of 11 nasal lid rifampin and tetracycline. The Acinetobacter bacteria complex was sensitive to ampicillin sulbactam with MIC of less than or equal to 2 sensitive to imipenem with MIC of less than or equal to 0.25 it had intermediate sensitivity ceftriaxone at 16 and has sensitivities to ceftaz edema and MIC of 4 to cefepime with MIC of 2 to gentamicin with MIC of less than or equal to 1 and tobramycin less than equal to 1 was resistant to ciprofloxacin intermediate to levofloxacin and resistant to Bactrim  Labs from [facility] showed recent Chemistries with BUN of 23 and creatinine of 1.50 normal electrolytes white blood cell count was normal at 9 hemoglobin 9.9 platelets 273   I saw the pt on Oct 16 and plain films done that day showed:   Findings: There is diffuse soft tissue swelling that is most  prominent medially. There is some irregularity along the cortex of  the medial malleolus. This could reflect cortical resorption from  osteomyelitis.  There is no other evidence of osteomyelitis. No fractures. The  ankle mortise is normally spaced and  aligned. There are plantar  dorsal calcaneal spurs and spurring from the dorsal navicular and  anterior talus. Dense vascular calcifications are noted along the  anterior tibial and posterior tibial arteries.  IMPRESSION:  Some cortical irregularity along the medial margin of the medial  malleolus suggests osteomyelitis particularly given the overlying  soft tissue swelling. This could be confirmed or excluded with  ankle MRI, with and without contrast.  I ordered MRI but this has not yet been done. Asian is continued on oral Augmentin and doxycycline. His daughter believes the wound is improving. They're continuing with topical preparations to the wound. On exam today still has a fairly large wound with some exudate apparent in the wound is along with the dressing. Her main concern for deeper infection and need to get MRI to evaluate this. We spent greater than 45 minutes with the patient including greater than 50% of time in face to face counsel of the patient and in coordination of their care.     Review of Systems  Constitutional: Negative for fever, chills, diaphoresis, fatigue and unexpected weight change.  HENT: Negative for sore throat, sneezing, trouble swallowing and sinus pressure.   Eyes: Negative for photophobia and visual disturbance.  Respiratory: Negative for cough and shortness of breath.   Gastrointestinal: Negative for nausea, vomiting, abdominal pain, diarrhea, constipation, blood in stool, abdominal distention and anal bleeding.  Musculoskeletal: Positive for arthralgias and gait problem. Negative for myalgias, back pain and joint swelling.  Skin: Positive for color change, rash and wound. Negative for  pallor.  Neurological: Negative for dizziness, tremors, weakness and light-headedness.  Hematological: Negative for adenopathy. Bruises/bleeds easily.  Psychiatric/Behavioral: Negative for behavioral problems, confusion, disturbed wake/sleep cycle, dysphoric mood,  decreased concentration and agitation.       Objective:   Physical Exam  Constitutional: No distress.  HENT:  Head: Normocephalic and atraumatic.  Mouth/Throat: No oropharyngeal exudate.  Eyes: Conjunctivae normal and EOM are normal. No scleral icterus.  Neck: Normal range of motion. Neck supple.  Cardiovascular: Normal rate and regular rhythm.   Pulmonary/Chest: Effort normal and breath sounds normal. No respiratory distress.  Abdominal: He exhibits no distension and no mass.  Musculoskeletal: He exhibits edema. He exhibits no tenderness.       Feet:  Lymphadenopathy:    He has no cervical adenopathy.  Neurological: He is alert. He exhibits normal muscle tone.  Skin: Skin is warm and dry. He is not diaphoretic. There is erythema. No pallor.     Psychiatric: He has a normal mood and affect. His behavior is normal.          Assessment & Plan:   Osteomyelitis of heel: Very concerned he may indeed have osteomyelitis at the site. Will perform an MRI pelvis with contrast better evaluate this. Indeed has asked myelitis or for him to orthopedic surgeon for evaluation for possible debridement or even more aggressive surgical strategies aimed at cure. If surgery is performed I would like to get cultures deep from the bone off antibiotics to help guide therapy. Once we have a plan from orthopedic surgery can then proceed with hopefully targeted antibiotics of unwilling to try him. Antibiotics for this severe infection. Would prefer targeted culture based therapy. Continue doxycycline and Augmentin.  MRSA: High probability culprit   Acinetobacter: More likely a colonizer, again another reason why deep cultures would help guide therapy and avoid unnecessary and possibly toxic antibiotics

## 2012-08-02 ENCOUNTER — Encounter (HOSPITAL_BASED_OUTPATIENT_CLINIC_OR_DEPARTMENT_OTHER): Payer: Medicare Other | Attending: General Surgery

## 2012-08-02 DIAGNOSIS — L89609 Pressure ulcer of unspecified heel, unspecified stage: Secondary | ICD-10-CM | POA: Insufficient documentation

## 2012-08-02 DIAGNOSIS — L89509 Pressure ulcer of unspecified ankle, unspecified stage: Secondary | ICD-10-CM | POA: Insufficient documentation

## 2012-08-02 DIAGNOSIS — L8994 Pressure ulcer of unspecified site, stage 4: Secondary | ICD-10-CM | POA: Insufficient documentation

## 2012-08-02 DIAGNOSIS — M869 Osteomyelitis, unspecified: Secondary | ICD-10-CM | POA: Insufficient documentation

## 2012-08-04 ENCOUNTER — Ambulatory Visit (HOSPITAL_COMMUNITY)
Admission: RE | Admit: 2012-08-04 | Discharge: 2012-08-04 | Disposition: A | Discharge status: Home / Self Care | Payer: Medicare Other | Source: Ambulatory Visit | Attending: Infectious Disease | Admitting: Infectious Disease

## 2012-08-04 DIAGNOSIS — M869 Osteomyelitis, unspecified: Secondary | ICD-10-CM | POA: Insufficient documentation

## 2012-08-04 DIAGNOSIS — L97309 Non-pressure chronic ulcer of unspecified ankle with unspecified severity: Secondary | ICD-10-CM | POA: Insufficient documentation

## 2012-08-04 MED ORDER — GADOBENATE DIMEGLUMINE 529 MG/ML IV SOLN
20.0000 mL | Freq: Once | INTRAVENOUS | Status: AC
  Administered 2012-08-04: 20 mL via INTRAVENOUS

## 2012-08-08 ENCOUNTER — Telehealth: Payer: Self-pay | Admitting: Infectious Disease

## 2012-08-08 DIAGNOSIS — M869 Osteomyelitis, unspecified: Secondary | ICD-10-CM

## 2012-08-08 NOTE — Telephone Encounter (Signed)
Tamika can we set up Mr Christina with Dr. Toni Arthurs he has always been a VERY helpful foot surgeon. If possible I would like the pt to be seen in the next 2 weeks

## 2012-08-08 NOTE — Telephone Encounter (Signed)
Excellent

## 2012-08-08 NOTE — Telephone Encounter (Signed)
I sent the records to Dr. Victorino Dike to review and they will call me with a time that they can see him

## 2012-08-18 ENCOUNTER — Telehealth: Payer: Self-pay | Admitting: Licensed Clinical Social Worker

## 2012-08-18 NOTE — Telephone Encounter (Signed)
Patient's daughter called stating that her appointment is not until next Wednesday with De. Hewitt, does she need to keep the appointment with you tomorrow?

## 2012-08-18 NOTE — Telephone Encounter (Signed)
The daughter stated that she would have Dr. Laverta [place] office to send [place] all information.

## 2012-08-18 NOTE — Telephone Encounter (Signed)
She DOES not necessarily have to come tomorrow since she has not YET seen Dr. Victorino Dike. PROVIDED the pt and Dr. Victorino Dike keep me in the loop on what is going on. For ex if Dr. Victorino Dike plans on surgery I would want to take the pt off antibiotics at least 2 days prior to surgery

## 2012-08-19 ENCOUNTER — Ambulatory Visit: Payer: Medicare Other | Admitting: Infectious Disease

## 2012-08-19 ENCOUNTER — Other Ambulatory Visit (HOSPITAL_COMMUNITY): Payer: Self-pay | Admitting: General Surgery

## 2012-08-19 DIAGNOSIS — I739 Peripheral vascular disease, unspecified: Secondary | ICD-10-CM

## 2012-08-24 ENCOUNTER — Ambulatory Visit (HOSPITAL_COMMUNITY)
Admission: RE | Admit: 2012-08-24 | Discharge: 2012-08-24 | Disposition: A | Discharge status: Home / Self Care | Payer: Medicare Other | Source: Ambulatory Visit | Attending: General Surgery | Admitting: General Surgery

## 2012-08-24 DIAGNOSIS — I739 Peripheral vascular disease, unspecified: Secondary | ICD-10-CM

## 2012-08-24 DIAGNOSIS — L97509 Non-pressure chronic ulcer of other part of unspecified foot with unspecified severity: Secondary | ICD-10-CM | POA: Insufficient documentation

## 2012-08-24 DIAGNOSIS — E11621 Type 2 diabetes mellitus with foot ulcer: Secondary | ICD-10-CM

## 2012-08-24 DIAGNOSIS — E1169 Type 2 diabetes mellitus with other specified complication: Secondary | ICD-10-CM | POA: Insufficient documentation

## 2012-08-24 NOTE — Progress Notes (Signed)
Arterial Doppler Completed. Jesse Chandler

## 2012-08-29 ENCOUNTER — Telehealth: Payer: Self-pay | Admitting: Infectious Disease

## 2012-08-29 NOTE — Telephone Encounter (Signed)
Received call from Dr. Victorino Dike. Pt does not want to undergo calacanectomy or BKA. Wants to go with suppressive therapy though we will consider deep biopsy once his wound has healed further.   Tamika can you plug him in with me in January in clinic?

## 2012-08-30 ENCOUNTER — Encounter (HOSPITAL_BASED_OUTPATIENT_CLINIC_OR_DEPARTMENT_OTHER): Payer: Medicare Other | Attending: General Surgery

## 2012-08-30 ENCOUNTER — Encounter (HOSPITAL_BASED_OUTPATIENT_CLINIC_OR_DEPARTMENT_OTHER): Payer: Medicare Other

## 2012-08-30 DIAGNOSIS — L89609 Pressure ulcer of unspecified heel, unspecified stage: Secondary | ICD-10-CM | POA: Insufficient documentation

## 2012-08-30 DIAGNOSIS — L8994 Pressure ulcer of unspecified site, stage 4: Secondary | ICD-10-CM | POA: Insufficient documentation

## 2012-08-30 DIAGNOSIS — L97409 Non-pressure chronic ulcer of unspecified heel and midfoot with unspecified severity: Secondary | ICD-10-CM | POA: Insufficient documentation

## 2012-08-30 DIAGNOSIS — E1169 Type 2 diabetes mellitus with other specified complication: Secondary | ICD-10-CM | POA: Insufficient documentation

## 2012-08-31 LAB — GLUCOSE, CAPILLARY: Glucose-Capillary: 162 mg/dL — ABNORMAL HIGH (ref 70–99)

## 2012-09-17 ENCOUNTER — Other Ambulatory Visit: Payer: Self-pay | Admitting: Infectious Disease

## 2012-10-04 ENCOUNTER — Encounter (HOSPITAL_BASED_OUTPATIENT_CLINIC_OR_DEPARTMENT_OTHER): Payer: Medicare Other | Attending: General Surgery

## 2012-10-04 DIAGNOSIS — E1169 Type 2 diabetes mellitus with other specified complication: Secondary | ICD-10-CM | POA: Insufficient documentation

## 2012-10-04 DIAGNOSIS — L97409 Non-pressure chronic ulcer of unspecified heel and midfoot with unspecified severity: Secondary | ICD-10-CM | POA: Insufficient documentation

## 2012-10-11 LAB — GLUCOSE, CAPILLARY

## 2012-10-18 ENCOUNTER — Encounter: Payer: Self-pay | Admitting: Infectious Disease

## 2012-10-18 ENCOUNTER — Ambulatory Visit (INDEPENDENT_AMBULATORY_CARE_PROVIDER_SITE_OTHER): Payer: Medicare Other | Admitting: Infectious Disease

## 2012-10-18 VITALS — BP 113/69 | HR 69 | Temp 97.4°F

## 2012-10-18 DIAGNOSIS — A498 Other bacterial infections of unspecified site: Secondary | ICD-10-CM

## 2012-10-18 DIAGNOSIS — M869 Osteomyelitis, unspecified: Secondary | ICD-10-CM

## 2012-10-18 DIAGNOSIS — N189 Chronic kidney disease, unspecified: Secondary | ICD-10-CM

## 2012-10-18 DIAGNOSIS — A4902 Methicillin resistant Staphylococcus aureus infection, unspecified site: Secondary | ICD-10-CM

## 2012-10-18 LAB — CBC WITH DIFFERENTIAL/PLATELET
Basophils Absolute: 0 10*3/uL (ref 0.0–0.1)
Eosinophils Relative: 5 % (ref 0–5)
HCT: 35.9 % — ABNORMAL LOW (ref 39.0–52.0)
Hemoglobin: 11.5 g/dL — ABNORMAL LOW (ref 13.0–17.0)
Lymphocytes Relative: 18 % (ref 12–46)
Lymphs Abs: 1.9 10*3/uL (ref 0.7–4.0)
MCV: 84.9 fL (ref 78.0–100.0)
Monocytes Absolute: 0.9 10*3/uL (ref 0.1–1.0)
Monocytes Relative: 8 % (ref 3–12)
RDW: 14.2 % (ref 11.5–15.5)
WBC: 10.3 10*3/uL (ref 4.0–10.5)

## 2012-10-18 MED ORDER — AMOXICILLIN-POT CLAVULANATE 500-125 MG PO TABS: 1.0000 | ORAL_TABLET | Freq: Two times a day (BID) | ORAL | Status: DC

## 2012-10-18 MED ORDER — DOXYCYCLINE HYCLATE 100 MG PO TABS: 100.0000 mg | ORAL_TABLET | Freq: Two times a day (BID) | ORAL | Status: DC

## 2012-10-18 NOTE — Progress Notes (Signed)
Subjective:    Patient ID: Jesse Chandler, male    DOB: [birth year], 79 y.o.   MRN: [id number]  HPI  79 year old [place] American man with multiple medical problems including diabetes dementia strokes, chronic kidney disease who is bedbound. He has developed an ulcer on his left heel due to sliding in his bed indicating into a pump. This ulcer developed in July and the patient has been seen at a wound care in [place] and ultimately by Dr. Wiliam Ke here at the wound center here at [facility]. Local care and recent debridement by Dr. Wiliam Ke. Some cultures were obtained in the left heel blood no other superficial or deep cultures. These were taken on September 24 and yielded methicillin resistant staph aureus along with Acinetobacter Complex.  Susceptibilities of methicillin-resistant staph aureus showed it to be sensitive to Bactrim vancomycin with MIC of 11 ZYVOX,  rifampin and tetracycline. The Acinetobacter bacteria complex was sensitive to ampicillin sulbactam with MIC of less than or equal to 2 sensitive to imipenem with MIC of less than or equal to 0.25 it had intermediate sensitivity ceftriaxone at 16 and has sensitivities to ceftaz edema and MIC of 4 to cefepime with MIC of 2 to gentamicin with MIC of less than or equal to 1 and tobramycin less than equal to 1 was resistant to ciprofloxacin intermediate to levofloxacin and resistant to Bactrim .  We obtained MRI of the ankle which showed:  IMPRESSION:  Lateral calcaneal tuberosity osteomyelitis with ulceration and  sinus tract extending from scan to bone surface. No soft tissue  abscess.   I referred him to Dr. Victorino Dike from Orthopedics for consideration of curative BKA but the patients' preference was to keep his foot. Per the daughter he is largely "bed bound" though he came from car to clinic with walker and her assistance. He has maintained chronic doxycyline and augmentin. He has not had fevers, chills or malaise. Wound has been stable to  improved. I spent greater than 45 minutes with the patient including greater than 50% of time in face to face counsel of the patient and his daughter and in coordination of their care.    Review of Systems  Constitutional: Negative for fever, fatigue and unexpected weight change.  Respiratory: Negative for cough, chest tightness, shortness of breath, wheezing and stridor.   Gastrointestinal: Negative for abdominal distention.  Genitourinary: Negative for dysuria, hematuria, flank pain and difficulty urinating.  Skin: Positive for color change and wound. Negative for rash.  Neurological: Negative for dizziness, tremors, weakness and light-headedness.  Hematological: Negative for adenopathy. Does not bruise/bleed easily.  Psychiatric/Behavioral: Positive for confusion, sleep disturbance and decreased concentration. Negative for behavioral problems and agitation.       Objective:   Physical Exam  Constitutional: He is oriented to person, place, and time. No distress.  HENT:  Head: Normocephalic and atraumatic.  Eyes: Conjunctivae normal and EOM are normal.  Neck: Normal range of motion. Neck supple.  Cardiovascular: Normal rate and regular rhythm.   Pulmonary/Chest: Effort normal and breath sounds normal.  Abdominal: He exhibits no distension.  Musculoskeletal: He exhibits edema. He exhibits no tenderness.       Feet:  Neurological: He is alert and oriented to person, place, and time.  Skin: Skin is warm and dry. He is not diaphoretic. No erythema. No pallor.  Psychiatric: He has a normal mood and affect. His behavior is normal. Judgment and thought content normal.          Assessment &  Plan:  Osteomyelitis: continue doxycyline and augmenting. Watch out for evidence of systemic spread. Monitor for CDI, continue wound care  MRSA: likely culprit  Acinetobacter: less likely true pathogen but will continue augmentin for now  CKD: check chemistries today with esr, crp

## 2012-10-19 LAB — COMPLETE METABOLIC PANEL WITH GFR
AST: 17 U/L (ref 0–37)
Albumin: 3.3 g/dL — ABNORMAL LOW (ref 3.5–5.2)
Alkaline Phosphatase: 71 U/L (ref 39–117)
BUN: 23 mg/dL (ref 6–23)
Potassium: 4 mEq/L (ref 3.5–5.3)
Sodium: 138 mEq/L (ref 135–145)
Total Bilirubin: 0.3 mg/dL (ref 0.3–1.2)

## 2012-10-19 LAB — C-REACTIVE PROTEIN: CRP: 3.1 mg/dL — ABNORMAL HIGH (ref <0.60)

## 2012-11-01 ENCOUNTER — Encounter (HOSPITAL_BASED_OUTPATIENT_CLINIC_OR_DEPARTMENT_OTHER): Payer: Medicare Other | Attending: General Surgery

## 2012-11-01 DIAGNOSIS — E1169 Type 2 diabetes mellitus with other specified complication: Secondary | ICD-10-CM | POA: Insufficient documentation

## 2012-11-01 DIAGNOSIS — L97409 Non-pressure chronic ulcer of unspecified heel and midfoot with unspecified severity: Secondary | ICD-10-CM | POA: Insufficient documentation

## 2012-11-08 LAB — GLUCOSE, CAPILLARY: Glucose-Capillary: 135 mg/dL — ABNORMAL HIGH (ref 70–99)

## 2012-12-06 ENCOUNTER — Encounter (HOSPITAL_BASED_OUTPATIENT_CLINIC_OR_DEPARTMENT_OTHER): Payer: Medicare Other | Attending: General Surgery

## 2012-12-06 DIAGNOSIS — L8994 Pressure ulcer of unspecified site, stage 4: Secondary | ICD-10-CM | POA: Insufficient documentation

## 2012-12-06 DIAGNOSIS — L89609 Pressure ulcer of unspecified heel, unspecified stage: Secondary | ICD-10-CM | POA: Insufficient documentation

## 2012-12-13 ENCOUNTER — Other Ambulatory Visit: Payer: Self-pay | Admitting: Urology

## 2012-12-15 MED ORDER — GENTAMICIN SULFATE 40 MG/ML IJ SOLN: 160.0000 mg | Freq: Once | INTRAVENOUS | Status: DC

## 2012-12-22 ENCOUNTER — Encounter (HOSPITAL_COMMUNITY): Payer: Self-pay | Admitting: Pharmacy Technician

## 2012-12-27 ENCOUNTER — Other Ambulatory Visit (HOSPITAL_COMMUNITY): Payer: Self-pay | Admitting: *Deleted

## 2012-12-27 ENCOUNTER — Encounter (HOSPITAL_COMMUNITY): Payer: Self-pay

## 2012-12-27 ENCOUNTER — Ambulatory Visit (HOSPITAL_COMMUNITY)
Admission: RE | Admit: 2012-12-27 | Discharge: 2012-12-27 | Disposition: A | Discharge status: Home / Self Care | Payer: Medicare Other | Source: Ambulatory Visit | Attending: Urology | Admitting: Urology

## 2012-12-27 ENCOUNTER — Encounter (HOSPITAL_COMMUNITY)
Admission: RE | Admit: 2012-12-27 | Discharge: 2012-12-27 | Disposition: A | Discharge status: Home / Self Care | Payer: Medicare Other | Source: Ambulatory Visit | Attending: Urology | Admitting: Urology

## 2012-12-27 DIAGNOSIS — Z01810 Encounter for preprocedural cardiovascular examination: Secondary | ICD-10-CM | POA: Insufficient documentation

## 2012-12-27 DIAGNOSIS — I517 Cardiomegaly: Secondary | ICD-10-CM | POA: Insufficient documentation

## 2012-12-27 DIAGNOSIS — Z01812 Encounter for preprocedural laboratory examination: Secondary | ICD-10-CM | POA: Insufficient documentation

## 2012-12-27 DIAGNOSIS — J811 Chronic pulmonary edema: Secondary | ICD-10-CM | POA: Insufficient documentation

## 2012-12-27 DIAGNOSIS — Z01818 Encounter for other preprocedural examination: Secondary | ICD-10-CM | POA: Insufficient documentation

## 2012-12-27 DIAGNOSIS — I10 Essential (primary) hypertension: Secondary | ICD-10-CM | POA: Insufficient documentation

## 2012-12-27 HISTORY — DX: Encounter for fitting and adjustment of urinary device: Z46.6

## 2012-12-27 HISTORY — DX: Carrier or suspected carrier of methicillin resistant Staphylococcus aureus: Z22.322

## 2012-12-27 LAB — BASIC METABOLIC PANEL
CO2: 21 mEq/L (ref 19–32)
Chloride: 105 mEq/L (ref 96–112)
Glucose, Bld: 123 mg/dL — ABNORMAL HIGH (ref 70–99)
Potassium: 4.5 mEq/L (ref 3.5–5.1)
Sodium: 138 mEq/L (ref 135–145)

## 2012-12-27 LAB — CBC
HCT: 36.4 % — ABNORMAL LOW (ref 39.0–52.0)
Hemoglobin: 11.4 g/dL — ABNORMAL LOW (ref 13.0–17.0)
MCH: 29.3 pg (ref 26.0–34.0)
MCV: 93.6 fL (ref 78.0–100.0)
RBC: 3.89 MIL/uL — ABNORMAL LOW (ref 4.22–5.81)
WBC: 7.4 10*3/uL (ref 4.0–10.5)

## 2012-12-27 NOTE — Progress Notes (Signed)
Paged Seabron Spates ( Infection Control Nurse) at [phone] Acinetobacter Complex.  Did not receive a call back.  Left message for her to call us at [phone] between the hours of 0830am-1630pm.

## 2012-12-27 NOTE — Pre-Procedure Instructions (Signed)
20 JERMEY CLOSS  12/27/2012   Your procedure is scheduled on:  Thursday, January 05, 2013  Report to [facility] at  AM. 0600  Call this number if you have problems the morning of surgery: [phone]   Remember:   Do not drink liquids or  eat food:After Midnight.Wednesday night      Take these medicines the morning of surgery with A SIP OF WATER: Dilitiazem,Synthroid,Vibra-tabs, Augmentin  And Hydrocodone with sip of water and use Duoneb                           SEE [place] PREPARING FOR SURGERY SHEET    Do not wear jewelry, make-up or nail polish.  Do not wear lotions, powders, or perfumes. You may wear deodorant.             Men may shave face and neck.  Do not bring valuables to the hospital.  Contacts, dentures or bridgework may not be worn into surgery.  Leave suitcase in the car. After surgery it may be brought to your room.  For patients admitted to the hospital, checkout time is 11:00 AM the day of                         discharge.   Patients discharged the day of surgery will not be allowed to drive home.  Name and phone number of your driver:      Please read over the following fact sheets that you were given:MRSA Information                    Call Jolyn Nap, RN pre op nurse if needed [phone]               FAILURE TO FOLLOW THESE INSTRUCTIONS MAY RESULT IN THE CANCELLATION OF YOUR SURGERY. PATIENT SIGNATURE___________________________________________________

## 2012-12-28 NOTE — Progress Notes (Signed)
Seabron Spates returned call regarding Acinebacter organism.  Seabron Spates stated note placed in EPIC and instructed nurse how to locate note she had placed.  Also stated patient to be on orange contact isolation.

## 2012-12-29 NOTE — Progress Notes (Signed)
12-27-2012 EKG and CXR done today results in Epic

## 2013-01-03 ENCOUNTER — Encounter (HOSPITAL_BASED_OUTPATIENT_CLINIC_OR_DEPARTMENT_OTHER): Payer: Medicare Other | Attending: General Surgery

## 2013-01-03 DIAGNOSIS — L8993 Pressure ulcer of unspecified site, stage 3: Secondary | ICD-10-CM | POA: Insufficient documentation

## 2013-01-03 DIAGNOSIS — E119 Type 2 diabetes mellitus without complications: Secondary | ICD-10-CM | POA: Insufficient documentation

## 2013-01-03 DIAGNOSIS — L89609 Pressure ulcer of unspecified heel, unspecified stage: Secondary | ICD-10-CM | POA: Insufficient documentation

## 2013-01-04 MED ORDER — GENTAMICIN SULFATE 40 MG/ML IJ SOLN
5.0000 mg/kg | INTRAVENOUS | Status: AC
  Administered 2013-01-05: 508 mg via INTRAVENOUS
  Filled 2013-01-04 (×2): qty 12.7

## 2013-01-04 NOTE — H&P (Signed)
Urology History and Physical Exam  CC: Urinary retention  HPI: 79 year old male with a chronic indwelling Foley catheter presents at this time for placement of suprapubic tube. He has bladder dysfunction from bladder outlet obstruction, and this required Foley catheterization for some time. He has traumatic hypospadias from this. Despite his age of 79, and slowly progressive prostate cancer, he actually has been fairly viable. The patient and his daughter have requested suprapubic tube placement to diminish his discomfort from Foley catheter placement to his penis.  The patient has been on preoperative ciprofloxacin because of his indwelling Foley catheter. PMH: Past Medical History  Diagnosis Date  . Prostate cancer   . Hypertension   . Hypothyroidism   . Psychosis   . CKD (chronic kidney disease) stage 3, GFR 30-59 ml/min     Baseline creatinine 1.7  . Posttraumatic stress disorder   . Dementia   . Recurrent right pleural effusion     H/O pleural catheter  . PAF (paroxysmal atrial fibrillation)   . Diabetes mellitus     no medication  . Anemia of chronic disease     s/p tx with erythropoietin and iron  . Catheter (urine) change required     Has indwelling foley  . MRSA (methicillin resistant staph aureus) culture positive 06-21-2012      10-18-2012 in Epic [facility]    PSH: Past Surgical History  Procedure Laterality Date  . Orchiectomy      bilateral  . Subdural hematoma evacuation via craniotomy    . Cholecystectomy      Allergies: No Known Allergies  Medications: No prescriptions prior to admission     Social History: History   Social History  . Marital Status: Married    Spouse Name: N/A    Number of Children: N/A  . Years of Education: N/A   Occupational History  . Not on file.   Social History Main Topics  . Smoking status: Former Games developer  . Smokeless tobacco: Never Used  . Alcohol Use: No     Comment: occasional - twice a year   . Drug Use: No  . Sexually Active: Not on file   Other Topics Concern  . Not on file   Social History Narrative  . No narrative on file    Family History: No family history on file.  Review of Systems: Positive: Wounds on feet, diabetic neuropathy Negative:  A further 10 point review of systems was negative except what is listed in the HPI.  Physical Exam: @VITALS2 @ General: No acute distress.  Awake. Head:  Normocephalic.  Atraumatic. ENT:  EOMI.  Mucous membranes moist Neck:  Supple.  No lymphadenopathy. CV:  S1 present. S2 present. Regular rate. Pulmonary: Equal effort bilaterally.  Clear to auscultation bilaterally. Abdomen: Soft.  Obese, non- tender to palpation. Skin:  Normal turgor.  No visible rash. Extremity: No gross deformity of bilateral upper extremities.  No gross deformity of    bilateral lower extremities. Neurologic: Alert. Appropriate mood.  Penis:  circumcised.  No lesions. Traumatic hypospadias noted Urethra:  Foley catheter in place. . Scrotum: No lesions.  No ecchymosis.  No erythema. Testicles: Absent.   Studies:  No results found for this basename: HGB, WBC, PLT,  in the last 72 hours  No results found for this basename: NA, K, CL, CO2, BUN, CREATININE, CALCIUM, MAGNESIUM, GFRNONAA, GFRAA,  in the last 72 hours   No results found for this basename: PT, INR, APTT,  in the last 72 hours   No components found with this basename: ABG,     Assessment:  Urinary retention secondary to bladder outlet obstruction  Plan: Placement of suprapubic tube

## 2013-01-05 ENCOUNTER — Ambulatory Visit (HOSPITAL_COMMUNITY): Payer: Medicare Other | Admitting: Anesthesiology

## 2013-01-05 ENCOUNTER — Encounter (HOSPITAL_COMMUNITY): Payer: Self-pay

## 2013-01-05 ENCOUNTER — Encounter (HOSPITAL_COMMUNITY): Payer: Self-pay | Admitting: Anesthesiology

## 2013-01-05 ENCOUNTER — Observation Stay (HOSPITAL_COMMUNITY)
Admission: RE | Admit: 2013-01-05 | Discharge: 2013-01-06 | Disposition: A | Discharge status: Home / Self Care | Payer: Medicare Other | Source: Ambulatory Visit | Attending: Urology | Admitting: Urology

## 2013-01-05 ENCOUNTER — Encounter (HOSPITAL_COMMUNITY)
Admission: RE | Disposition: A | Discharge status: Home / Self Care | Payer: Self-pay | Source: Ambulatory Visit | Attending: Urology

## 2013-01-05 DIAGNOSIS — N189 Chronic kidney disease, unspecified: Secondary | ICD-10-CM | POA: Insufficient documentation

## 2013-01-05 DIAGNOSIS — I129 Hypertensive chronic kidney disease with stage 1 through stage 4 chronic kidney disease, or unspecified chronic kidney disease: Secondary | ICD-10-CM | POA: Insufficient documentation

## 2013-01-05 DIAGNOSIS — Q549 Hypospadias, unspecified: Secondary | ICD-10-CM | POA: Insufficient documentation

## 2013-01-05 DIAGNOSIS — E119 Type 2 diabetes mellitus without complications: Secondary | ICD-10-CM | POA: Insufficient documentation

## 2013-01-05 DIAGNOSIS — R339 Retention of urine, unspecified: Principal | ICD-10-CM | POA: Insufficient documentation

## 2013-01-05 DIAGNOSIS — C61 Malignant neoplasm of prostate: Secondary | ICD-10-CM | POA: Insufficient documentation

## 2013-01-05 DIAGNOSIS — N320 Bladder-neck obstruction: Secondary | ICD-10-CM | POA: Insufficient documentation

## 2013-01-05 DIAGNOSIS — C679 Malignant neoplasm of bladder, unspecified: Secondary | ICD-10-CM | POA: Insufficient documentation

## 2013-01-05 DIAGNOSIS — N368 Other specified disorders of urethra: Secondary | ICD-10-CM | POA: Insufficient documentation

## 2013-01-05 DIAGNOSIS — I4891 Unspecified atrial fibrillation: Secondary | ICD-10-CM | POA: Insufficient documentation

## 2013-01-05 DIAGNOSIS — E039 Hypothyroidism, unspecified: Secondary | ICD-10-CM | POA: Insufficient documentation

## 2013-01-05 HISTORY — PX: INSERTION OF SUPRAPUBIC CATHETER: SHX5870

## 2013-01-05 LAB — GLUCOSE, CAPILLARY
Glucose-Capillary: 88 mg/dL (ref 70–99)
Glucose-Capillary: 109 mg/dL — ABNORMAL HIGH (ref 70–99)

## 2013-01-05 SURGERY — INSERTION, SUPRAPUBIC CATHETER
Anesthesia: Monitor Anesthesia Care | Site: Perineum | Wound class: Clean Contaminated

## 2013-01-05 MED ORDER — BICALUTAMIDE 50 MG PO TABS
50.0000 mg | ORAL_TABLET | Freq: Every day | ORAL | Status: DC
  Administered 2013-01-05 – 2013-01-06 (×2): 50 mg via ORAL
  Filled 2013-01-05 (×2): qty 1

## 2013-01-05 MED ORDER — FLUCONAZOLE 150 MG PO TABS
150.0000 mg | ORAL_TABLET | Freq: Every day | ORAL | Status: DC
  Administered 2013-01-05 – 2013-01-06 (×2): 150 mg via ORAL
  Filled 2013-01-05 (×2): qty 1

## 2013-01-05 MED ORDER — MORPHINE SULFATE 2 MG/ML IJ SOLN: 2.0000 mg | INTRAMUSCULAR | Status: DC | PRN

## 2013-01-05 MED ORDER — PANTOPRAZOLE SODIUM 40 MG PO TBEC
40.0000 mg | DELAYED_RELEASE_TABLET | Freq: Every day | ORAL | Status: DC
  Administered 2013-01-05 – 2013-01-06 (×2): 40 mg via ORAL
  Filled 2013-01-05 (×2): qty 1

## 2013-01-05 MED ORDER — DEXTROSE 5 % IV SOLN
140.0000 mg | Freq: Two times a day (BID) | INTRAVENOUS | Status: DC
  Administered 2013-01-06 (×2): 140 mg via INTRAVENOUS
  Filled 2013-01-05 (×2): qty 3.5

## 2013-01-05 MED ORDER — LACTATED RINGERS IV SOLN: INTRAVENOUS | Status: DC

## 2013-01-05 MED ORDER — IPRATROPIUM BROMIDE 0.02 % IN SOLN: 0.5000 mg | Freq: Four times a day (QID) | RESPIRATORY_TRACT | Status: DC | PRN

## 2013-01-05 MED ORDER — SODIUM CHLORIDE 0.9 % IR SOLN
Status: DC | PRN
  Administered 2013-01-05: 1000 mL

## 2013-01-05 MED ORDER — LIDOCAINE HCL 2 % EX GEL
CUTANEOUS | Status: DC | PRN
  Administered 2013-01-05: 1 via URETHRAL

## 2013-01-05 MED ORDER — GENTAMICIN SULFATE 40 MG/ML IJ SOLN
180.0000 mg | Freq: Once | INTRAVENOUS | Status: AC
  Administered 2013-01-05: 180 mg via INTRAVENOUS
  Filled 2013-01-05: qty 4.5

## 2013-01-05 MED ORDER — ALBUTEROL SULFATE (5 MG/ML) 0.5% IN NEBU: 2.5000 mg | INHALATION_SOLUTION | Freq: Four times a day (QID) | RESPIRATORY_TRACT | Status: DC | PRN

## 2013-01-05 MED ORDER — LIDOCAINE-EPINEPHRINE 1 %-1:100000 IJ SOLN
INTRAMUSCULAR | Status: AC
  Filled 2013-01-05: qty 1

## 2013-01-05 MED ORDER — ONDANSETRON HCL 4 MG/2ML IJ SOLN: 4.0000 mg | INTRAMUSCULAR | Status: DC | PRN

## 2013-01-05 MED ORDER — PROPOFOL INFUSION 10 MG/ML OPTIME
INTRAVENOUS | Status: DC | PRN
  Administered 2013-01-05: 140 ug/kg/min via INTRAVENOUS

## 2013-01-05 MED ORDER — FUROSEMIDE 20 MG PO TABS
20.0000 mg | ORAL_TABLET | Freq: Every day | ORAL | Status: DC
  Administered 2013-01-05 – 2013-01-06 (×2): 20 mg via ORAL
  Filled 2013-01-05 (×2): qty 1

## 2013-01-05 MED ORDER — PROMETHAZINE HCL 25 MG/ML IJ SOLN: 6.2500 mg | INTRAMUSCULAR | Status: DC | PRN

## 2013-01-05 MED ORDER — SODIUM CHLORIDE 0.45 % IV SOLN
INTRAVENOUS | Status: DC
  Administered 2013-01-05 – 2013-01-06 (×2): via INTRAVENOUS

## 2013-01-05 MED ORDER — POLYETHYLENE GLYCOL 3350 17 G PO PACK
17.0000 g | PACK | Freq: Every day | ORAL | Status: DC
  Administered 2013-01-05 – 2013-01-06 (×2): 17 g via ORAL
  Filled 2013-01-05 (×2): qty 1

## 2013-01-05 MED ORDER — SODIUM CHLORIDE 0.9 % IR SOLN
Status: DC | PRN
  Administered 2013-01-05: 09:00:00

## 2013-01-05 MED ORDER — LACTATED RINGERS IV SOLN
INTRAVENOUS | Status: DC | PRN
  Administered 2013-01-05: 08:00:00 via INTRAVENOUS

## 2013-01-05 MED ORDER — BUPIVACAINE HCL (PF) 0.5 % IJ SOLN
INTRAMUSCULAR | Status: AC
  Filled 2013-01-05: qty 30

## 2013-01-05 MED ORDER — FENTANYL CITRATE 0.05 MG/ML IJ SOLN: 25.0000 ug | INTRAMUSCULAR | Status: DC | PRN

## 2013-01-05 MED ORDER — HYDROCODONE-ACETAMINOPHEN 5-325 MG PO TABS: 1.0000 | ORAL_TABLET | ORAL | Status: DC | PRN

## 2013-01-05 MED ORDER — KETAMINE HCL 10 MG/ML IJ SOLN
INTRAMUSCULAR | Status: DC | PRN
  Administered 2013-01-05 (×3): 10 mg via INTRAVENOUS

## 2013-01-05 MED ORDER — IPRATROPIUM-ALBUTEROL 0.5-2.5 (3) MG/3ML IN SOLN: 3.0000 mL | Freq: Four times a day (QID) | RESPIRATORY_TRACT | Status: DC | PRN

## 2013-01-05 MED ORDER — DOCUSATE SODIUM 100 MG PO CAPS
100.0000 mg | ORAL_CAPSULE | Freq: Two times a day (BID) | ORAL | Status: DC
  Administered 2013-01-05 – 2013-01-06 (×3): 100 mg via ORAL
  Filled 2013-01-05 (×4): qty 1

## 2013-01-05 MED ORDER — LIDOCAINE HCL 2 % EX GEL
CUTANEOUS | Status: AC
  Filled 2013-01-05: qty 10

## 2013-01-05 MED ORDER — LEVOTHYROXINE SODIUM 50 MCG PO TABS
50.0000 ug | ORAL_TABLET | Freq: Every day | ORAL | Status: DC
  Administered 2013-01-05 – 2013-01-06 (×2): 50 ug via ORAL
  Filled 2013-01-05 (×3): qty 1

## 2013-01-05 MED ORDER — DILTIAZEM HCL ER COATED BEADS 240 MG PO CP24
240.0000 mg | ORAL_CAPSULE | Freq: Every day | ORAL | Status: DC
  Administered 2013-01-06: 240 mg via ORAL
  Filled 2013-01-05: qty 1

## 2013-01-05 MED ORDER — LIDOCAINE-EPINEPHRINE 1 %-1:100000 IJ SOLN
INTRAMUSCULAR | Status: DC | PRN
  Administered 2013-01-05: 20 mL

## 2013-01-05 SURGICAL SUPPLY — 26 items
BAG URINE DRAINAGE (UROLOGICAL SUPPLIES) ×2 IMPLANT
BAG URINE LEG 500ML (DRAIN) ×1 IMPLANT
BLADE SURG 15 STRL LF DISP TIS (BLADE) ×1 IMPLANT
BLADE SURG 15 STRL SS (BLADE) ×2
CATH FOLEY 2WAY SLVR 30CC 22FR (CATHETERS) ×1 IMPLANT
CLOTH BEACON ORANGE TIMEOUT ST (SAFETY) ×3 IMPLANT
COVER SURGICAL LIGHT HANDLE (MISCELLANEOUS) ×2 IMPLANT
ELECT REM PT RETURN 9FT ADLT (ELECTROSURGICAL) ×2
ELECTRODE REM PT RTRN 9FT ADLT (ELECTROSURGICAL) ×1 IMPLANT
GLOVE SURG SS PI 8.0 STRL IVOR (GLOVE) ×3 IMPLANT
GOWN STRL REIN XL XLG (GOWN DISPOSABLE) ×5 IMPLANT
KIT SUPRAPUBIC CATH (MISCELLANEOUS) ×1 IMPLANT
MANIFOLD NEPTUNE II (INSTRUMENTS) ×1 IMPLANT
NDL SPNL 20GX3.5 QUINCKE YW (NEEDLE) IMPLANT
NEEDLE HYPO 22GX1.5 SAFETY (NEEDLE) IMPLANT
NEEDLE SPNL 20GX3.5 QUINCKE YW (NEEDLE) ×2 IMPLANT
NS IRRIG 1000ML POUR BTL (IV SOLUTION) ×2 IMPLANT
PACK CYSTO (CUSTOM PROCEDURE TRAY) ×2 IMPLANT
PENCIL BUTTON HOLSTER BLD 10FT (ELECTRODE) ×1 IMPLANT
PLUG CATH AND CAP STER (CATHETERS) IMPLANT
SPONGE DRAIN TRACH 4X4 STRL 2S (GAUZE/BANDAGES/DRESSINGS) ×1 IMPLANT
SUT ETHILON 3 0 FSL (SUTURE) ×1 IMPLANT
SUT SILK 2 0 SH (SUTURE) ×1 IMPLANT
TAPE CLOTH SURG 4X10 WHT LF (GAUZE/BANDAGES/DRESSINGS) ×1 IMPLANT
TOWEL OR 17X26 10 PK STRL BLUE (TOWEL DISPOSABLE) ×2 IMPLANT
WATER STERILE IRR 3000ML UROMA (IV SOLUTION) ×2 IMPLANT

## 2013-01-05 NOTE — Progress Notes (Signed)
Post-op note  Subjective: The patient is doing well.  No complaints.  Objective: Vital signs in last 24 hours: Temp:  [97.2 F (36.2 C)-97.8 F (36.6 C)] 97.4 F (36.3 C) (04/10 1321) Pulse Rate:  [65-104] 65 (04/10 1321) Resp:  [12-20] 16 (04/10 1321) BP: (134-165)/(55-74) 134/55 mmHg (04/10 1321) SpO2:  [100 %] 100 % (04/10 1321) Weight:  [101.6 kg (223 lb 15.8 oz)] 101.6 kg (223 lb 15.8 oz) (04/10 1321)  Intake/Output from previous day:   Intake/Output this shift: Total I/O In: 880 [P.O.:480; I.V.:400] Out: 210 [Urine:210]  Physical Exam:  General: Alert and oriented. Abdomen: Soft, Nondistended. Incisions: Clean and dry. Urine is slightly bloody. Dressing is dry. Lab Results: No results found for this basename: HGB, HCT,  in the last 72 hours  Assessment/Plan: POD#0   1) Continue to monitor   Bertram Millard. Floretta Petro, MD   LOS: 0 days   Chelsea Aus 01/05/2013, 6:19 PM

## 2013-01-05 NOTE — Interval H&P Note (Signed)
History and Physical Interval Note:  01/05/2013 8:28 AM  Jesse Chandler  has presented today for surgery, with the diagnosis of NEUROGENIC BLADDER  The various methods of treatment have been discussed with the patient and family. After consideration of risks, benefits and other options for treatment, the patient has consented to  Procedure(s): INSERTION OF SUPRAPUBIC CATHETER (N/A) as a surgical intervention .  The patient's history has been reviewed, patient examined, no change in status, stable for surgery.  I have reviewed the patient's chart and labs.  Questions were answered to the patient's satisfaction.     Chelsea Aus

## 2013-01-05 NOTE — Anesthesia Preprocedure Evaluation (Signed)
Anesthesia Evaluation  Patient identified by MRN, date of birth, ID band Patient awake    Reviewed: Allergy & Precautions, H&P , NPO status , Patient's Chart, lab work & pertinent test results  Airway Mallampati: III TM Distance: <3 FB Neck ROM: Full    Dental no notable dental hx.    Pulmonary neg pulmonary ROS,  breath sounds clear to auscultation  Pulmonary exam normal       Cardiovascular hypertension, Pt. on medications Rhythm:Regular Rate:Normal     Neuro/Psych negative neurological ROS  negative psych ROS   GI/Hepatic negative GI ROS, Neg liver ROS,   Endo/Other  diabetesHypothyroidism   Renal/GU Renal InsufficiencyRenal disease  negative genitourinary   Musculoskeletal negative musculoskeletal ROS (+)   Abdominal   Peds negative pediatric ROS (+)  Hematology   Anesthesia Other Findings   Reproductive/Obstetrics negative OB ROS                           Anesthesia Physical Anesthesia Plan  ASA: III  Anesthesia Plan: MAC   Post-op Pain Management:    Induction: Intravenous  Airway Management Planned: Simple Face Mask  Additional Equipment:   Intra-op Plan:   Post-operative Plan:   Informed Consent: I have reviewed the patients History and Physical, chart, labs and discussed the procedure including the risks, benefits and alternatives for the proposed anesthesia with the patient or authorized representative who has indicated his/her understanding and acceptance.     Plan Discussed with: CRNA and Surgeon  Anesthesia Plan Comments:         Anesthesia Quick Evaluation

## 2013-01-05 NOTE — Progress Notes (Signed)
Pt finished his cipro 01/04/13 pm.

## 2013-01-05 NOTE — Op Note (Signed)
Preoperative diagnosis: Urinary retention with long-term indwelling Foley catheter with urethral erosion  Postoperative diagnosis: Same   Procedure: Cystoscopy, placement of suprapubic tube    Surgeon: Bertram Millard. Gentry Pilson, M.D.   Anesthesia: Gen.   Complications: None  Specimen(s): None  Drain(s): 22 French Foley as suprapubic tube  Indications: 80 year old male with long-term indwelling Foley for bladder outlet obstruction secondary to progressive bladder cancer. The patient has significant urethral erosion. Suprapubic tube will be placed to enable easier ladder drainage. The patient and his daughter have been instructed on the procedure as well as risks and complications. He has been on preoperative Cipro.    Technique and findings: The patient was identified in the holding area. He received preoperative IV antibiotics. He was taken to the operating room where sedation was administered anesthesia. Prior to removal of his indwelling Foley catheter, his bladder was irrigated and then filled with antibiotic solution. Following removal of his Foley, his lower abdomen and perineum as well as genitalia were prepped and draped. Proper timeout was then performed.  I then infiltrated approximately 20 cc of 1% lidocaine with epinephrine in his suprapubic area in the midline, down into his subcutaneous tissue. Following this, cystoscopy was performed. There was significant growth of prosthetic tissue at the bladder neck. There was obvious obstruction. The remaining bladder was normal with the exception of some trabeculation. Ureteral orifices were normal in configuration and location. I then left approximately 500 cc of irrigant within the bladder. A Lowsley retractor was then placed through the urethra, and then into the bladder. I could palpate the tip of this at the area where the lidocaine was infiltrated. A cutdown over this with the Bovie. Incision was carried down to the Lowsley retractor which  was then brought up through the small midline lower abdominal wound. A 22 French Foley catheter with a 30 cc balloon was then grasped and brought into the bladder. The retractor was then removed. The catheter balloon was filled with 30 cc of water. Cystoscopy revealed adequate placement of the catheter. There was no significant bleeding. The catheter was sutured to the skin into areas with 0 silk sutures. Dry sterile dressing was placed. The patient tolerated procedure well and was taken to the PACU in stable condition.

## 2013-01-05 NOTE — Anesthesia Postprocedure Evaluation (Signed)
  Anesthesia Post-op Note  Patient: Jesse Chandler  Procedure(s) Performed: Procedure(s) (LRB): INSERTION OF SUPRAPUBIC CATHETER (N/A)  Patient Location: PACU  Anesthesia Type: MAC  Level of Consciousness: awake and alert   Airway and Oxygen Therapy: Patient Spontanous Breathing  Post-op Pain: mild  Post-op Assessment: Post-op Vital signs reviewed, Patient's Cardiovascular Status Stable, Respiratory Function Stable, Patent Airway and No signs of Nausea or vomiting  Last Vitals:  Filed Vitals:   01/05/13 0639  BP: 164/74  Pulse: 100  Temp: 36.6 C  Resp: 20    Post-op Vital Signs: stable   Complications: No apparent anesthesia complications

## 2013-01-05 NOTE — Transfer of Care (Signed)
Immediate Anesthesia Transfer of Care Note  Patient: Jesse Chandler  Procedure(s) Performed: Procedure(s): INSERTION OF SUPRAPUBIC CATHETER (N/A)  Patient Location: PACU  Anesthesia Type:MAC  Level of Consciousness: awake, alert , oriented and patient cooperative  Airway & Oxygen Therapy: Patient Spontanous Breathing and Patient connected to face mask oxygen  Post-op Assessment: Report given to PACU RN and Post -op Vital signs reviewed and stable  Post vital signs: Reviewed and stable  Complications: No apparent anesthesia complications

## 2013-01-05 NOTE — Preoperative (Signed)
Beta Blockers   Reason not to administer Beta Blockers:Not Applicable 

## 2013-01-05 NOTE — Progress Notes (Signed)
ANTIBIOTIC CONSULT NOTE - INITIAL  Pharmacy Consult for Gentamicin Indication: Bladder treatment  No Known Allergies  Patient Measurements:    Vital Signs: Temp: 97.3 F (36.3 C) (04/10 1118) BP: 140/68 mmHg (04/10 1118) Pulse Rate: 82 (04/10 1118) Intake/Output from previous day:   Intake/Output from this shift: Total I/O In: 400 [I.V.:400] Out: 60 [Urine:60]  Labs: No results found for this basename: WBC, HGB, PLT, LABCREA, CREATININE,  in the last 72 hours The CrCl is unknown because both a height and weight (above a minimum accepted value) are required for this calculation. No results found for this basename: VANCOTROUGH, Leodis Binet, VANCORANDOM, GENTTROUGH, GENTPEAK, GENTRANDOM, TOBRATROUGH, TOBRAPEAK, TOBRARND, AMIKACINPEAK, AMIKACINTROU, AMIKACIN,  in the last 72 hours   Microbiology: Recent Results (from the past 720 hour(s))  SURGICAL PCR SCREEN     Status: None   Collection Time    12/27/12 11:15 AM      Result Value Range Status   MRSA, PCR NEGATIVE  NEGATIVE Final   Staphylococcus aureus NEGATIVE  NEGATIVE Final   Comment:            The Xpert SA Assay (FDA     approved for NASAL specimens     in patients over 36 years of age),     is one component of     a comprehensive surveillance     program.  Test performance has     been validated by [employer] for patients greater     than or equal to 17 year old.     It is not intended     to diagnose infection nor to     guide or monitor treatment.    Medical History: Past Medical History  Diagnosis Date  . Prostate cancer   . Hypertension   . Hypothyroidism   . Psychosis   . CKD (chronic kidney disease) stage 3, GFR 30-59 ml/min     Baseline creatinine 1.7  . Posttraumatic stress disorder   . Dementia   . Recurrent right pleural effusion     H/O pleural catheter  . PAF (paroxysmal atrial fibrillation)   . Diabetes mellitus     no medication  . Anemia of chronic disease     s/p tx with  erythropoietin and iron  . Catheter (urine) change required     Has indwelling foley  . MRSA (methicillin resistant staph aureus) culture positive 06-21-2012      10-18-2012 in Epic [facility]    Assessment: 79 yoM with prostate cancer and hx urinary retention 2/2 bladder outlet obstruction requiring chronic indwelling Foley Catheter, which has caused traumatic hypospadias.  Pt now POD#0 for suprapubic tube placement to diminish discomfort from foley catheter.  Pt was taking augmentin, doxycycline, and fluconazole PTA and was given ciprofloxacin pre-op. Now pharmacy has been asked to manage gentimicin.    Pt has CKD-III, baseline SCr 1.7.  Last SCr 1.39.  Weight 4/1 = 101.6kg.  CrCl ~53  Afebrile  Fluconazole 150mg  daily resumed inpatient  Adjusted body weight = 88.6kg  Due to underlying renal impairment and advanced age, will use traditional dosing for gentamicin.   Goal of Therapy:  Gentamicin trough level <2 mcg/ml  Plan:  1.  Gentamicin loading dose = 180mg  (2mg /kg) 2.  Gentamicin maintenance dose = 140mg  q 12 hours  3.  Follow-up trough at steady state  Jonathan Corpus E 01/05/2013,12:14 PM

## 2013-01-05 NOTE — Progress Notes (Signed)
Dr. Retta Diones already aware of left side of penis looking filleted.

## 2013-01-06 ENCOUNTER — Encounter (HOSPITAL_COMMUNITY): Payer: Self-pay | Admitting: Urology

## 2013-01-06 NOTE — Progress Notes (Signed)
Clinical Social Work Department BRIEF PSYCHOSOCIAL ASSESSMENT 01/06/2013  Patient:  Jesse Chandler, Jesse Chandler     Account Number:  [id number]     Admit date:  01/05/2013  Clinical Social Worker:  Orpah Greek  Date/Time:  01/06/2013 01:27 PM  Referred by:  Physician  Date Referred:  01/06/2013 Referred for  SNF Placement   Other Referral:   Interview type:  Family Other interview type:   patient's daughter, Jesse Chandler    PSYCHOSOCIAL DATA Living Status:  ALONE Admitted from facility:   Level of care:   Primary support name:  Jesse Chandler (daughter) Primary support relationship to patient:  CHILD, ADULT Degree of support available:   good    CURRENT CONCERNS Current Concerns  Post-Acute Placement   Other Concerns:    SOCIAL WORK ASSESSMENT / PLAN CSW spoke with patient's daughter, Jesse Chandler at bedside re: discharge planning. Patient was admitted yesterday under observation status, daughter upset stating that she has been working on getting him placed in a SNF for the past 2 weeks.   Assessment/plan status:  Information/Referral to [facility] Other assessment/ plan:   Information/referral to community resources:   CSW completed FL2 and faxed information out to [facility] SNFs - daughter expressed interest in [place] [facility] [place], [facility] @ [facility] says they will have a bed available tomorrow.    PATIENT'S/FAMILY'S RESPONSE TO PLAN OF CARE: Patient's daughter, Jesse Chandler states that if we can get insurance [facility]) authorization that she will take patient home and have him admitted tomorrow to SNF.        Unice Bailey, LCSW [facility] Clinical Social Worker cell #: [phone]

## 2013-01-06 NOTE — Progress Notes (Signed)
Clinical Social Work  CSW received authorization from Community education officer. Authorization # [id number]. [facility] is aware of authorization and will confirm with [facility]. CSW prepared DC packet and faxed DC summary to SNF. Patient and family aware of dc plans.  Unk Lightning, LCSW  (Coverage for [employer])

## 2013-01-06 NOTE — Progress Notes (Addendum)
Clinical Social Work Department CLINICAL SOCIAL WORK PLACEMENT NOTE 01/06/2013  Patient:  OLYN, LANDSTROM  Account Number:  [id number] Admit date:  01/05/2013  Clinical Social Worker:  Orpah Greek  Date/time:  01/06/2013 01:31 PM  Clinical Social Work is seeking post-discharge placement for this patient at the following level of care:   SKILLED NURSING   (*CSW will update this form in Epic as items are completed)   01/06/2013  Patient/family provided with [facility] Department of Clinical Social Work's list of facilities offering this level of care within the geographic area requested by the patient (or if unable, by the patient's family).  01/06/2013  Patient/family informed of their freedom to choose among providers that offer the needed level of care, that participate in Medicare, Medicaid or managed care program needed by the patient, have an available bed and are willing to accept the patient.  01/06/2013  Patient/family informed of [facility]' ownership interest in [facility], as well as of the fact that they are under no obligation to receive care at this facility.  PASARR submitted to EDS on 01/06/2013 PASARR number received from EDS on 01/06/2013  FL2 transmitted to all facilities in geographic area requested by pt/family on  01/06/2013 FL2 transmitted to all facilities within larger geographic area on   Patient informed that his/her managed care company has contracts with or will negotiate with  certain facilities, including the following:     Patient/family informed of bed offers received:  01/06/2013 Patient chooses bed at [facility] Physician recommends and patient chooses bed at    Patient to be transferred to [facility]  on  01/07/13 Patient to be transferred to facility by [facility]  The following physician request were entered in Epic:   Additional Comments:  Unice Bailey, LCSW [facility] Clinical Social Worker cell #:  [phone]

## 2013-01-06 NOTE — Discharge Summary (Signed)
Patient ID: YACQUB BASTON MRN: [id number] DOB/AGE: 77-Jan-1925 77 y.o.  Admit date: 01/05/2013 Discharge date: 01/06/2013  Primary Care Physician:  [name], MD  Discharge Diagnoses:   Urinary retention, prostate cancer  Consults:  None     Discharge Medications:   Medication List    TAKE these medications       amoxicillin-clavulanate 500-125 MG per tablet  Commonly known as:  AUGMENTIN  Take 1 tablet (500 mg total) by mouth 2 (two) times daily.     aspirin EC 81 MG tablet  Take 81 mg by mouth daily.     bicalutamide 50 MG tablet  Commonly known as:  CASODEX  Take 50 mg by mouth daily.     calcium-vitamin D 500-200 MG-UNIT per tablet  Commonly known as:  OSCAL WITH D  Take 1 tablet by mouth 2 (two) times daily.     diltiazem 240 MG 24 hr capsule  Commonly known as:  CARDIZEM CD  Take 240 mg by mouth daily.     docusate sodium 100 MG capsule  Commonly known as:  COLACE  Take 100 mg by mouth 2 (two) times daily.     doxycycline 100 MG tablet  Commonly known as:  VIBRA-TABS  Take 1 tablet (100 mg total) by mouth 2 (two) times daily.     esomeprazole 40 MG capsule  Commonly known as:  NEXIUM  Take 40 mg by mouth daily.     ferrous fumarate 325 (106 FE) MG Tabs  Commonly known as:  HEMOCYTE - 106 mg FE  Take 1 tablet by mouth 2 (two) times daily.     fluconazole 150 MG tablet  Commonly known as:  DIFLUCAN  Take 150 mg by mouth daily.     folic acid 1 MG tablet  Commonly known as:  FOLVITE  Take 1 mg by mouth daily.     furosemide 20 MG tablet  Commonly known as:  LASIX  Take 20 mg by mouth daily.     HYDROcodone-acetaminophen 5-325 MG per tablet  Commonly known as:  NORCO/VICODIN  Take 1 tablet by mouth every 4 (four) hours as needed.     ipratropium-albuterol 0.5-2.5 (3) MG/3ML Soln  Commonly known as:  DUONEB  Take 3 mLs by nebulization every 6 (six) hours as needed. shorntess of breath/ wheezing     levothyroxine 50 MCG tablet  Commonly  known as:  SYNTHROID, LEVOTHROID  Take 50 mcg by mouth daily.     polyethylene glycol packet  Commonly known as:  MIRALAX / GLYCOLAX  Take 17 g by mouth daily as needed. constipation     senna 8.6 MG tablet  Commonly known as:  SENOKOT  Take 1 tablet by mouth daily.         Significant Diagnostic Studies:  Dg Chest 2 View  12/27/2012  *RADIOLOGY REPORT*  Clinical Data: Hypertension  CHEST - 2 VIEW  Comparison: July 17, 2012.  Findings: Stable cardiomegaly.  No change seen involving mild right pleural effusion compared to prior exam.  Left lung clear. Elevated left hemidiaphragm is again noted.  IMPRESSION: Stable changes involving right lung base.  No significant change compared to prior exam.   Original Report Authenticated By: Lupita Raider.,  M.D.     Brief H and P: For complete details please refer to admission H and P, but in brief the patient was admitted for placement of suprapubic tube for treatment of chronic urinary retention secondary to bladder outlet obstruction secondary to  prostate cancer.  Hospital Course:  The patient was admitted directly to the operating room for suprapubic tube placement, which he tolerated well. He had no problems postoperatively, and was discharged on postoperative day #1 to home. He will eventually be placed in rehabilitation per daughter.  Day of Discharge BP 142/58  Pulse 59  Temp(Src) 97.5 F (36.4 C) (Oral)  Resp 16  Ht 6\' 1"  (1.854 m)  Wt 101.6 kg (223 lb 15.8 oz)  BMI 29.56 kg/m2  SpO2 100%  Results for orders placed during the hospital encounter of 01/05/13 (from the past 24 hour(s))  GLUCOSE, CAPILLARY     Status: Abnormal   Collection Time    01/05/13 10:04 AM      Result Value Range   Glucose-Capillary 109 (*) 70 - 99 mg/dL   Comment 1 Documented in Chart      Physical Exam: General: Alert and awake oriented x3 not in any acute distress. HEENT: anicteric sclera, pupils reactive to light and accommodation CVS: S1-S2  clear no murmur rubs or gallops Chest: clear to auscultation bilaterally, no wheezing rales or rhonchi Abdomen: soft nontender, nondistended, normal bowel sounds, no organomegaly Extremities: no cyanosis, clubbing or edema noted bilaterally Neuro: Cranial nerves II-XII intact, no focal neurological deficits  Disposition:  Home  Diet:  No restrictions  Activity:  No restrictions   Disposition and Follow-up:      Future Appointments Provider Department Dept Phone   01/11/2013 11:00 AM Randall Hiss, MD [facility] for Infectious Disease [phone]       Followup in office  TESTS THAT NEED FOLLOW-UP  None  DISCHARGE FOLLOW-UP Follow-up Information   Follow up with Chelsea Aus, MD. (as scheduled)    Contact information:   [address] [place] [address] [phone]       Time spent on Discharge:  15 minutes  Signed: Chelsea Aus 01/06/2013, 7:18 AM

## 2013-01-06 NOTE — Evaluation (Signed)
Physical Therapy Evaluation Patient Details Name: Jesse Chandler MRN: [id number] DOB: 1924/09/16 Today's Date: 01/06/2013 Time: 6578-4696 PT Time Calculation (min): 21 min  PT Assessment / Plan / Recommendation Clinical Impression  Pt admitted for insertion of suprapubic catheter due to urinary retention and prostate cancer.  Pt would benefit from acute PT services in order to improve independence with transfers and ambulation to prepare for d/c to next venue.  Pt presents at min assist level today for mobility and usually uses L boot for heel ulcer with wound vac however boot not present in room at time of evaluation.    PT Assessment  Patient needs continued PT services    Follow Up Recommendations  SNF    Does the patient have the potential to tolerate intense rehabilitation      Barriers to Discharge        Equipment Recommendations  None recommended by PT    Recommendations for Other Services     Frequency Min 3X/week    Precautions / Restrictions Precautions Precautions: Fall Precaution Comments: wound vac L heel, suprapubic catheter   Pertinent Vitals/Pain Denies L heel pain with ambulation, no c/o pain during session      Mobility  Bed Mobility Bed Mobility: Supine to Sit Supine to Sit: 4: Min assist;HOB elevated Details for Bed Mobility Assistance: verbal cues for technique, assist for trunk Transfers Transfers: Sit to Stand;Stand to Sit Sit to Stand: 4: Min assist;From bed;With upper extremity assist Stand to Sit: 4: Min assist;To bed;With upper extremity assist Details for Transfer Assistance: verbal cues for safe technique, assist to rise and control descent, verbal cue for trunk/hip extension upon standing, assist for safety with sitting due to pt sitting while taking final steps back up to bed Ambulation/Gait Ambulation/Gait Assistance: 4: Min assist Ambulation Distance (Feet): 40 Feet Assistive device: Rolling walker Ambulation/Gait Assistance Details:  L boot not in room (daughter left in car) so cued pt to put weight through L forefoot however pt using flat foot despite multiple cues so encouraged more WBing through UE and RW, pt denies pain in L heel, verbal cues for RW distance Gait Pattern: Step-through pattern;Decreased stride length;Trunk flexed Gait velocity: decreased General Gait Details: SaO2 94-96% room air during ambulation    Exercises     PT Diagnosis: Difficulty walking  PT Problem List: Decreased strength;Decreased activity tolerance;Decreased mobility;Decreased knowledge of use of DME PT Treatment Interventions: DME instruction;Gait training;Functional mobility training;Therapeutic activities;Therapeutic exercise;Patient/family education   PT Goals Acute Rehab PT Goals PT Goal Formulation: With patient Time For Goal Achievement: 01/13/13 Potential to Achieve Goals: Good Pt will go Supine/Side to Sit: with modified independence PT Goal: Supine/Side to Sit - Progress: Goal set today Pt will go Sit to Stand: with modified independence PT Goal: Sit to Stand - Progress: Goal set today Pt will go Stand to Sit: with modified independence PT Goal: Stand to Sit - Progress: Goal set today Pt will Ambulate: 51 - 150 feet;with supervision;with least restrictive assistive device PT Goal: Ambulate - Progress: Goal set today Pt will Perform Home Exercise Program: with supervision, verbal cues required/provided PT Goal: Perform Home Exercise Program - Progress: Goal set today  Visit Information  Last PT Received On: 01/06/13 Assistance Needed: +1    Subjective Data  Subjective: pt likes ranch dressing.   Prior Functioning  Home Living Lives With: Daughter Available Help at Discharge: Family Type of Home: House Home Access: Level entry Home Layout: One level Home Adaptive Equipment: Walker - rolling;Hospital  bed;Wheelchair - manual;Bedside commode/3-in-1 Prior Function Level of Independence: Independent with assistive  device(s) Comments: daughter reports pt mod I with RW and L boot for heel ulcer with wound vac Communication Communication: No difficulties    Cognition  Cognition Overall Cognitive Status: Appears within functional limits for tasks assessed/performed Arousal/Alertness: Awake/alert Orientation Level: Appears intact for tasks assessed Behavior During Session: [facility] for tasks performed    Extremity/Trunk Assessment Right Upper Extremity Assessment RUE ROM/Strength/Tone: [facility] for tasks assessed Left Upper Extremity Assessment LUE ROM/Strength/Tone: WFL for tasks assessed Right Lower Extremity Assessment RLE ROM/Strength/Tone: Deficits RLE ROM/Strength/Tone Deficits: functional weakness observed Left Lower Extremity Assessment LLE ROM/Strength/Tone: Deficits LLE ROM/Strength/Tone Deficits: functional weakness observed   Balance    End of Session PT - End of Session Activity Tolerance: Patient tolerated treatment well Patient left: in bed;Other (comment) (sitting EOB with family present to eat lunch)  GP Functional Assessment Tool Used: clinical judgement Functional Limitation: Mobility: Walking and moving around Mobility: Walking and Moving Around Current Status ([id number]): At least 20 percent but less than 40 percent impaired, limited or restricted Mobility: Walking and Moving Around Goal Status [phone]): At least 1 percent but less than 20 percent impaired, limited or restricted   Avleen Bordwell,KATHrine E 01/06/2013, 12:38 PM Zenovia Jarred, PT, DPT 01/06/2013 Pager: [phone]

## 2013-01-06 NOTE — Consult Note (Signed)
WOC consult Note Reason for Consult:Change NPWT device on right LE Wound type: chronic pressure ulcer with osteo. Patient followed by Dr. Joanne Gavel at the outpatient wound care center. Pressure Ulcer POA: Yes. Family states it was a Stage IV. Measurement:3cm x 2cm x .4cm Wound JWJ:XBJYN, pink, granulating Drainage (amount, consistency, odor) scant, serous Periwound:intact Dressing procedure/placement/frequency: NPWT dressing changed over healing Stage IV (family reported) pressure ulcer on right heel. Family asking for placement of collagen dressing or silver dressing prior to npwt placement; I have placed only a small piece of the black GranuFoam dressing today as a wound contact layer.  Drape applied and an additional piece of foam used as a bridge to reduce the likelihood of a device related pressure ulcer from the npwt device tubing.  negative pressure applied and tolerated well. Total number of black foam pieces used = 2. Patient's daughter taking him home with npwt device covered with gauze.  She is informed and reminded to re-apply npwt within 2 hours or to contact [facility] if assistance is needed. I will not follow.  Please re-consult if needed. Thanks, Ladona Mow, MSN, RN, [facility], CWOCN [phone])

## 2013-01-11 ENCOUNTER — Encounter: Payer: Self-pay | Admitting: Infectious Disease

## 2013-01-11 ENCOUNTER — Ambulatory Visit (INDEPENDENT_AMBULATORY_CARE_PROVIDER_SITE_OTHER): Payer: Medicare Other | Admitting: Infectious Disease

## 2013-01-11 VITALS — BP 157/80 | HR 76 | Temp 98.7°F

## 2013-01-11 DIAGNOSIS — M86172 Other acute osteomyelitis, left ankle and foot: Secondary | ICD-10-CM

## 2013-01-11 DIAGNOSIS — N183 Chronic kidney disease, stage 3 unspecified: Secondary | ICD-10-CM

## 2013-01-11 DIAGNOSIS — E1149 Type 2 diabetes mellitus with other diabetic neurological complication: Secondary | ICD-10-CM

## 2013-01-11 DIAGNOSIS — E11610 Type 2 diabetes mellitus with diabetic neuropathic arthropathy: Secondary | ICD-10-CM

## 2013-01-11 DIAGNOSIS — A4902 Methicillin resistant Staphylococcus aureus infection, unspecified site: Secondary | ICD-10-CM

## 2013-01-11 DIAGNOSIS — M86179 Other acute osteomyelitis, unspecified ankle and foot: Secondary | ICD-10-CM

## 2013-01-11 LAB — BASIC METABOLIC PANEL WITH GFR
BUN: 22 mg/dL (ref 6–23)
Calcium: 8.6 mg/dL (ref 8.4–10.5)
GFR, Est African American: 43 mL/min — ABNORMAL LOW
GFR, Est Non African American: 37 mL/min — ABNORMAL LOW
Potassium: 4.7 mEq/L (ref 3.5–5.3)
Sodium: 141 mEq/L (ref 135–145)

## 2013-01-11 LAB — CBC WITH DIFFERENTIAL/PLATELET
Basophils Relative: 0 % (ref 0–1)
Hemoglobin: 10.5 g/dL — ABNORMAL LOW (ref 13.0–17.0)
MCHC: 31.2 g/dL (ref 30.0–36.0)
Monocytes Relative: 9 % (ref 3–12)
Neutro Abs: 7.4 10*3/uL (ref 1.7–7.7)
Neutrophils Relative %: 70 % (ref 43–77)
Platelets: 238 10*3/uL (ref 150–400)
RBC: 3.72 MIL/uL — ABNORMAL LOW (ref 4.22–5.81)

## 2013-01-11 LAB — C-REACTIVE PROTEIN: CRP: 4.4 mg/dL — ABNORMAL HIGH (ref <0.60)

## 2013-01-11 NOTE — Progress Notes (Signed)
Subjective:    Patient ID: Jesse Chandler, male    DOB: Jul 25, 1924, 79 y.o.   MRN: [id number]  HPI   79 year-old [place] American man with multiple medical problems including diabetes dementia strokes, chronic kidney disease who is bedbound. He has developed an ulcer on his left heel due to sliding in his bed indicating into a pump. This ulcer developed in July and the patient has been seen at a wound care in [place] and ultimately by Dr. Wiliam Ke here at the wound center here at [facility]. Local care and recent debridement by Dr. Wiliam Ke. Some cultures were obtained in the left heel blood no other superficial or deep cultures. These were taken on September 24 and yielded methicillin resistant staph aureus along with Acinetobacter Complex.  Susceptibilities of methicillin-resistant staph aureus showed it to be sensitive to Bactrim vancomycin with MIC of 11 ZYVOX,  rifampin and tetracycline. The Acinetobacter bacteria complex was sensitive to ampicillin sulbactam with MIC of less than or equal to 2 sensitive to imipenem with MIC of less than or equal to 0.25 it had intermediate sensitivity ceftriaxone at 16 and has sensitivities to ceftaz edema and MIC of 4 to cefepime with MIC of 2 to gentamicin with MIC of less than or equal to 1 and tobramycin less than equal to 1 was resistant to ciprofloxacin intermediate to levofloxacin and resistant to Bactrim .  We obtained MRI of the ankle which showed:  IMPRESSION:  Lateral calcaneal tuberosity osteomyelitis with ulceration and  sinus tract extending from scan to bone surface. No soft tissue  abscess.   I referred him to Dr. Victorino Dike from Orthopedics for consideration of curative BKA but the patients' preference was to keep his foot. Per the  One daughter he is largely "bed bound" though he came from car to clinic with walker and her assistance. He has maintained chronic doxycyline and augmentin. He has not had fevers, chills or malaise   . Wound has been  stable and examined today with a small area is black on one edge of the wound but otherwise has granulation tissue and tissue that bleeds easily.  View the patient's MRI findings with both himself and his daughter I reviewed the entire case and the nature of chronic osteomyelitis in diabetic patients including this patient specific case with the daughter the patient and the other daughter. Abscess again and this chronic osteomyelitis will not be cured by oral or IV antibiotics and that he requires a below the knee and dictation to effect cure. Continue suppressive antibiotics offer some chance to continue to preserve the foot although it concurs the risk of infection becoming uncontrolled and potentially entering his bloodstream and causing sepsis. For now we'll continue with current oral antibiotics but I've encouraged the patient to revisit the idea below the knee amputation with Dr. Victorino Dike  wE spent greater than 45 minutes with the patient including greater than 50% of time in face to face counsel of the patient and his daughter and in coordination of their care.  RN and CMA unpacked and packed his wound.    Review of Systems  Constitutional: Negative for fever, fatigue and unexpected weight change.  Respiratory: Negative for cough, chest tightness, shortness of breath, wheezing and stridor.   Gastrointestinal: Negative for abdominal distention.  Genitourinary: Negative for dysuria, hematuria, flank pain and difficulty urinating.  Skin: Positive for color change and wound. Negative for rash.  Neurological: Negative for dizziness, tremors, weakness and light-headedness.  Hematological: Negative for  adenopathy. Does not bruise/bleed easily.  Psychiatric/Behavioral: Positive for confusion, sleep disturbance and decreased concentration. Negative for behavioral problems and agitation.       Objective:   Physical Exam  Constitutional: He is oriented to person, place, and time. No distress.    HENT:  Head: Normocephalic and atraumatic.  Eyes: Conjunctivae and EOM are normal.  Neck: Normal range of motion. Neck supple.  Cardiovascular: Normal rate and regular rhythm.   Pulmonary/Chest: Effort normal and breath sounds normal.  Abdominal: He exhibits no distension.  Musculoskeletal: He exhibits edema. He exhibits no tenderness.       Feet:  Neurological: He is alert and oriented to person, place, and time.  Skin: Skin is warm and dry. He is not diaphoretic. No erythema. No pallor.  Psychiatric: He has a normal mood and affect. His behavior is normal. Judgment and thought content normal.          Assessment & Plan:  Osteomyelitis: continue doxycyline and augmentin. Encouraged him to revisit with Dr. Victorino Dike and to consider the below the knee and dictation that can be curative   Watch out for evidence of systemic spread. Monitor for CDI, continue wound care  MRSA: likely culprit  Acinetobacter: less likely true pathogen but will continue augmentin for now  CKD: check chemistries today with esr, crp

## 2013-02-07 ENCOUNTER — Encounter (HOSPITAL_COMMUNITY): Payer: Self-pay

## 2013-02-07 ENCOUNTER — Encounter (HOSPITAL_COMMUNITY)
Admission: RE | Admit: 2013-02-07 | Discharge: 2013-02-07 | Disposition: A | Discharge status: Home / Self Care | Payer: Medicare Other | Source: Ambulatory Visit | Attending: Orthopedic Surgery | Admitting: Orthopedic Surgery

## 2013-02-07 HISTORY — DX: Osteomyelitis, unspecified: M86.9

## 2013-02-07 HISTORY — DX: Pressure ulcer of unspecified heel, unspecified stage: L89.609

## 2013-02-07 HISTORY — DX: Retention of urine, unspecified: R33.9

## 2013-02-07 HISTORY — DX: Polyneuropathy, unspecified: G62.9

## 2013-02-07 HISTORY — DX: Obstructive and reflux uropathy, unspecified: N13.9

## 2013-02-07 HISTORY — DX: Gastro-esophageal reflux disease without esophagitis: K21.9

## 2013-02-07 LAB — BASIC METABOLIC PANEL
BUN: 21 mg/dL (ref 6–23)
Calcium: 8.9 mg/dL (ref 8.4–10.5)
Chloride: 103 mEq/L (ref 96–112)
Creatinine, Ser: 1.59 mg/dL — ABNORMAL HIGH (ref 0.50–1.35)
GFR calc Af Amer: 43 mL/min — ABNORMAL LOW (ref >90)
GFR calc non Af Amer: 37 mL/min — ABNORMAL LOW (ref >90)

## 2013-02-07 LAB — CBC
HCT: 30.7 % — ABNORMAL LOW (ref 39.0–52.0)
MCHC: 31.3 g/dL (ref 30.0–36.0)
Platelets: 222 10*3/uL (ref 150–400)
RDW: 13.6 % (ref 11.5–15.5)
WBC: 8.4 10*3/uL (ref 4.0–10.5)

## 2013-02-07 LAB — SURGICAL PCR SCREEN
MRSA, PCR: NEGATIVE
Staphylococcus aureus: NEGATIVE

## 2013-02-07 LAB — NO BLOOD PRODUCTS

## 2013-02-07 NOTE — Progress Notes (Signed)
Spoke with Cordelia Pen at [employer] remind of need for orders;Dr.Hewitt in surgery but will notify

## 2013-02-07 NOTE — Progress Notes (Signed)
Pt doesn't have a cardiologist  Denies echo/stress test/heart cath  Dr.Robert Dough is Medical Md

## 2013-02-07 NOTE — Pre-Procedure Instructions (Signed)
Jesse Chandler  02/07/2013   Your procedure is scheduled on:  Thurs, May 15 @ 1:15 PM  Report to [facility] at 11:15 AM.  Call this number if you have problems the morning of surgery: [phone]   Remember:   Do not eat food or drink liquids after midnight.   Take these medicines the morning of surgery with A SIP OF WATER: Diltiazem(Cardizem),Esomeprazole(Nexium),Diflucan(Fluconazole),Pain Pill(if needed),and Synthroid(Levothyroxine)               No Aspirin,Goody's,BC's,ALeve,Ibuprofen,Fish Oil,or any Herbal Medications   Do not wear jewelry  Do not wear lotions, powders, or colognes. You may wear deodorant.  Men may shave face and neck.  Do not bring valuables to the hospital.  Contacts, dentures or bridgework may not be worn into surgery.  Leave suitcase in the car. After surgery it may be brought to your room.  For patients admitted to the hospital, checkout time is 11:00 AM the day of  discharge.   Patients discharged the day of surgery will not be allowed to drive  home.    Special Instructions: Shower using CHG 2 nights before surgery and the night before surgery.  If you shower the day of surgery use CHG.  Use special wash - you have one bottle of CHG for all showers.  You should use approximately 1/3 of the bottle for each shower.   Please read over the following fact sheets that you were given: Pain Booklet, Coughing and Deep Breathing, MRSA Information and Surgical Site Infection Prevention

## 2013-02-08 ENCOUNTER — Other Ambulatory Visit: Payer: Self-pay | Admitting: Orthopedic Surgery

## 2013-02-08 NOTE — Progress Notes (Signed)
This is an 79 year old male with multiple medical comorbidities who is scheduled for a left below the knee amputation to be performed by Dr. Toni Arthurs on Thursday 09 Feb 2013. Lab results dated 07 Feb 2013 show anemia with RBCs of 3.45 and H/H of 9.6/30.7, however, RBC indices are all within the normal range to include a PLT count of 222. His past medical history is significant for chronic kidney disease (CKD). An EKG dated 27 December 2012 and confirmed by cardiology is abnormal but shows no significant changes since last tracing.  Please refer to surgical clearance note in the chart dated 06 Feb 2013. A CXR was performed on 27 December 2012 and compared to previous CXR dated 17 July 2012. IMPRESSION: Stable changes involving right lung base. No significant change compared to last exam. May proceed with surgery as scheduled.

## 2013-02-09 ENCOUNTER — Ambulatory Visit (HOSPITAL_COMMUNITY): Payer: Medicare Other | Admitting: Anesthesiology

## 2013-02-09 ENCOUNTER — Encounter (HOSPITAL_COMMUNITY): Payer: Self-pay | Admitting: Anesthesiology

## 2013-02-09 ENCOUNTER — Encounter (HOSPITAL_COMMUNITY)
Admission: RE | Disposition: A | Discharge status: Skilled Nursing Facility | Payer: Self-pay | Source: Ambulatory Visit | Attending: Orthopedic Surgery

## 2013-02-09 ENCOUNTER — Encounter (HOSPITAL_COMMUNITY): Payer: Self-pay | Admitting: *Deleted

## 2013-02-09 ENCOUNTER — Inpatient Hospital Stay (HOSPITAL_COMMUNITY)
Admission: RE | Admit: 2013-02-09 | Discharge: 2013-02-13 | DRG: 617 | Disposition: A | Discharge status: Skilled Nursing Facility | Payer: Medicare Other | Source: Ambulatory Visit | Attending: Orthopedic Surgery | Admitting: Orthopedic Surgery

## 2013-02-09 DIAGNOSIS — Z794 Long term (current) use of insulin: Secondary | ICD-10-CM

## 2013-02-09 DIAGNOSIS — A499 Bacterial infection, unspecified: Secondary | ICD-10-CM

## 2013-02-09 DIAGNOSIS — I10 Essential (primary) hypertension: Secondary | ICD-10-CM | POA: Diagnosis present

## 2013-02-09 DIAGNOSIS — F4310 Post-traumatic stress disorder, unspecified: Secondary | ICD-10-CM | POA: Diagnosis present

## 2013-02-09 DIAGNOSIS — L97409 Non-pressure chronic ulcer of unspecified heel and midfoot with unspecified severity: Secondary | ICD-10-CM | POA: Diagnosis present

## 2013-02-09 DIAGNOSIS — Z8614 Personal history of Methicillin resistant Staphylococcus aureus infection: Secondary | ICD-10-CM

## 2013-02-09 DIAGNOSIS — D638 Anemia in other chronic diseases classified elsewhere: Secondary | ICD-10-CM | POA: Diagnosis present

## 2013-02-09 DIAGNOSIS — I4891 Unspecified atrial fibrillation: Secondary | ICD-10-CM

## 2013-02-09 DIAGNOSIS — I480 Paroxysmal atrial fibrillation: Secondary | ICD-10-CM | POA: Diagnosis present

## 2013-02-09 DIAGNOSIS — E11621 Type 2 diabetes mellitus with foot ulcer: Secondary | ICD-10-CM | POA: Diagnosis present

## 2013-02-09 DIAGNOSIS — M869 Osteomyelitis, unspecified: Secondary | ICD-10-CM | POA: Diagnosis present

## 2013-02-09 DIAGNOSIS — L97509 Non-pressure chronic ulcer of other part of unspecified foot with unspecified severity: Secondary | ICD-10-CM

## 2013-02-09 DIAGNOSIS — A4902 Methicillin resistant Staphylococcus aureus infection, unspecified site: Secondary | ICD-10-CM

## 2013-02-09 DIAGNOSIS — K219 Gastro-esophageal reflux disease without esophagitis: Secondary | ICD-10-CM | POA: Diagnosis present

## 2013-02-09 DIAGNOSIS — F0390 Unspecified dementia without behavioral disturbance: Secondary | ICD-10-CM | POA: Diagnosis present

## 2013-02-09 DIAGNOSIS — Z79899 Other long term (current) drug therapy: Secondary | ICD-10-CM

## 2013-02-09 DIAGNOSIS — I129 Hypertensive chronic kidney disease with stage 1 through stage 4 chronic kidney disease, or unspecified chronic kidney disease: Secondary | ICD-10-CM | POA: Diagnosis present

## 2013-02-09 DIAGNOSIS — E1159 Type 2 diabetes mellitus with other circulatory complications: Secondary | ICD-10-CM | POA: Diagnosis present

## 2013-02-09 DIAGNOSIS — E1169 Type 2 diabetes mellitus with other specified complication: Principal | ICD-10-CM | POA: Diagnosis present

## 2013-02-09 DIAGNOSIS — N183 Chronic kidney disease, stage 3 unspecified: Secondary | ICD-10-CM | POA: Diagnosis present

## 2013-02-09 DIAGNOSIS — M9080 Osteopathy in diseases classified elsewhere, unspecified site: Secondary | ICD-10-CM | POA: Diagnosis present

## 2013-02-09 DIAGNOSIS — Z8546 Personal history of malignant neoplasm of prostate: Secondary | ICD-10-CM

## 2013-02-09 DIAGNOSIS — E039 Hypothyroidism, unspecified: Secondary | ICD-10-CM | POA: Diagnosis present

## 2013-02-09 HISTORY — DX: Shortness of breath: R06.02

## 2013-02-09 HISTORY — PX: AMPUTATION: SHX166

## 2013-02-09 HISTORY — DX: Procedure and treatment not carried out because of patient's decision for reasons of belief and group pressure: Z53.1

## 2013-02-09 HISTORY — PX: LEG AMPUTATION BELOW KNEE: SHX694

## 2013-02-09 LAB — GLUCOSE, CAPILLARY
Glucose-Capillary: 91 mg/dL (ref 70–99)
Glucose-Capillary: 107 mg/dL — ABNORMAL HIGH (ref 70–99)
Glucose-Capillary: 95 mg/dL (ref 70–99)

## 2013-02-09 LAB — CBC
HCT: 32.2 % — ABNORMAL LOW (ref 39.0–52.0)
MCHC: 32 g/dL (ref 30.0–36.0)
MCV: 88.7 fL (ref 78.0–100.0)
RDW: 13.4 % (ref 11.5–15.5)
WBC: 7.1 10*3/uL (ref 4.0–10.5)

## 2013-02-09 LAB — CREATININE, SERUM: GFR calc Af Amer: 60 mL/min — ABNORMAL LOW (ref >90)

## 2013-02-09 SURGERY — AMPUTATION BELOW KNEE
Anesthesia: General | Site: Leg Lower | Laterality: Left | Wound class: Dirty or Infected

## 2013-02-09 MED ORDER — OXYCODONE HCL 5 MG PO TABS: 5.0000 mg | ORAL_TABLET | Freq: Once | ORAL | Status: DC | PRN

## 2013-02-09 MED ORDER — DOCUSATE SODIUM 100 MG PO CAPS
100.0000 mg | ORAL_CAPSULE | Freq: Two times a day (BID) | ORAL | Status: DC
  Administered 2013-02-09 – 2013-02-13 (×8): 100 mg via ORAL
  Filled 2013-02-09 (×8): qty 1

## 2013-02-09 MED ORDER — ENOXAPARIN SODIUM 30 MG/0.3ML ~~LOC~~ SOLN
30.0000 mg | SUBCUTANEOUS | Status: DC
  Administered 2013-02-09: 30 mg via SUBCUTANEOUS
  Filled 2013-02-09 (×2): qty 0.3

## 2013-02-09 MED ORDER — BUPIVACAINE LIPOSOME 1.3 % IJ SUSP
20.0000 mL | INTRAMUSCULAR | Status: DC
  Filled 2013-02-09: qty 20

## 2013-02-09 MED ORDER — ACETAMINOPHEN 325 MG PO TABS
650.0000 mg | ORAL_TABLET | Freq: Four times a day (QID) | ORAL | Status: DC | PRN
  Administered 2013-02-11: 650 mg via ORAL
  Filled 2013-02-09: qty 2

## 2013-02-09 MED ORDER — IPRATROPIUM BROMIDE 0.02 % IN SOLN: 0.5000 mg | Freq: Four times a day (QID) | RESPIRATORY_TRACT | Status: DC | PRN

## 2013-02-09 MED ORDER — VANCOMYCIN HCL IN DEXTROSE 1-5 GM/200ML-% IV SOLN
INTRAVENOUS | Status: AC
  Administered 2013-02-09: 1000 mg via INTRAVENOUS
  Filled 2013-02-09: qty 200

## 2013-02-09 MED ORDER — FOLIC ACID 1 MG PO TABS
1.0000 mg | ORAL_TABLET | Freq: Every day | ORAL | Status: DC
  Administered 2013-02-10 – 2013-02-13 (×4): 1 mg via ORAL
  Filled 2013-02-09 (×4): qty 1

## 2013-02-09 MED ORDER — SENNOSIDES 8.6 MG PO TABS: 2.0000 | ORAL_TABLET | Freq: Every day | ORAL | Status: DC

## 2013-02-09 MED ORDER — VANCOMYCIN HCL IN DEXTROSE 1-5 GM/200ML-% IV SOLN: 1000.0000 mg | INTRAVENOUS | Status: DC

## 2013-02-09 MED ORDER — DILTIAZEM HCL ER COATED BEADS 240 MG PO CP24
240.0000 mg | ORAL_CAPSULE | Freq: Every day | ORAL | Status: DC
  Administered 2013-02-10 – 2013-02-13 (×4): 240 mg via ORAL
  Filled 2013-02-09 (×4): qty 1

## 2013-02-09 MED ORDER — POLYETHYLENE GLYCOL 3350 17 G PO PACK
17.0000 g | PACK | Freq: Every day | ORAL | Status: DC
  Administered 2013-02-09 – 2013-02-13 (×5): 17 g via ORAL
  Filled 2013-02-09 (×5): qty 1

## 2013-02-09 MED ORDER — INSULIN ASPART 100 UNIT/ML ~~LOC~~ SOLN
0.0000 [IU] | Freq: Three times a day (TID) | SUBCUTANEOUS | Status: DC
  Administered 2013-02-13: 1 [IU] via SUBCUTANEOUS

## 2013-02-09 MED ORDER — PROPOFOL 10 MG/ML IV BOLUS
INTRAVENOUS | Status: DC | PRN
  Administered 2013-02-09: 130 mg via INTRAVENOUS

## 2013-02-09 MED ORDER — CHLORHEXIDINE GLUCONATE 4 % EX LIQD: 60.0000 mL | Freq: Once | CUTANEOUS | Status: DC

## 2013-02-09 MED ORDER — FLUCONAZOLE 150 MG PO TABS
150.0000 mg | ORAL_TABLET | Freq: Every day | ORAL | Status: DC
  Administered 2013-02-09 – 2013-02-13 (×5): 150 mg via ORAL
  Filled 2013-02-09 (×6): qty 1

## 2013-02-09 MED ORDER — GUAIFENESIN 100 MG/5ML PO SOLN
15.0000 mL | ORAL | Status: DC | PRN
  Filled 2013-02-09: qty 15

## 2013-02-09 MED ORDER — DEXTROSE 5 % IV SOLN
2.0000 g | Freq: Two times a day (BID) | INTRAVENOUS | Status: DC
  Administered 2013-02-09 – 2013-02-13 (×8): 2 g via INTRAVENOUS
  Filled 2013-02-09 (×9): qty 2

## 2013-02-09 MED ORDER — SODIUM CHLORIDE 0.9 % IV SOLN
INTRAVENOUS | Status: DC
  Administered 2013-02-09: 100 mL/h via INTRAVENOUS

## 2013-02-09 MED ORDER — GUAIFENESIN ER 600 MG PO TB12
1200.0000 mg | ORAL_TABLET | Freq: Two times a day (BID) | ORAL | Status: DC
  Administered 2013-02-09 – 2013-02-13 (×8): 1200 mg via ORAL
  Filled 2013-02-09 (×7): qty 2
  Filled 2013-02-09: qty 1
  Filled 2013-02-09 (×2): qty 2

## 2013-02-09 MED ORDER — VANCOMYCIN HCL 10 G IV SOLR
1500.0000 mg | INTRAVENOUS | Status: DC
  Administered 2013-02-10 – 2013-02-12 (×3): 1500 mg via INTRAVENOUS
  Filled 2013-02-09 (×5): qty 1500

## 2013-02-09 MED ORDER — ONDANSETRON HCL 4 MG/2ML IJ SOLN
INTRAMUSCULAR | Status: DC | PRN
  Administered 2013-02-09: 4 mg via INTRAVENOUS

## 2013-02-09 MED ORDER — ONDANSETRON HCL 4 MG PO TABS: 4.0000 mg | ORAL_TABLET | Freq: Four times a day (QID) | ORAL | Status: DC | PRN

## 2013-02-09 MED ORDER — OXYCODONE HCL 5 MG/5ML PO SOLN
5.0000 mg | Freq: Once | ORAL | Status: DC | PRN
Start: 2013-02-09 — End: 2013-02-09

## 2013-02-09 MED ORDER — PROMETHAZINE HCL 25 MG/ML IJ SOLN: 6.2500 mg | INTRAMUSCULAR | Status: DC | PRN

## 2013-02-09 MED ORDER — HYDROMORPHONE HCL PF 1 MG/ML IJ SOLN: 0.5000 mg | INTRAMUSCULAR | Status: DC | PRN

## 2013-02-09 MED ORDER — LEVOTHYROXINE SODIUM 50 MCG PO TABS
50.0000 ug | ORAL_TABLET | Freq: Every day | ORAL | Status: DC
  Administered 2013-02-10 – 2013-02-13 (×4): 50 ug via ORAL
  Filled 2013-02-09 (×4): qty 1

## 2013-02-09 MED ORDER — IPRATROPIUM-ALBUTEROL 0.5-2.5 (3) MG/3ML IN SOLN: 3.0000 mL | Freq: Four times a day (QID) | RESPIRATORY_TRACT | Status: DC | PRN

## 2013-02-09 MED ORDER — 0.9 % SODIUM CHLORIDE (POUR BTL) OPTIME
TOPICAL | Status: DC | PRN
  Administered 2013-02-09: 1000 mL

## 2013-02-09 MED ORDER — GUAIFENESIN 100 MG/5ML PO LIQD: 300.0000 mg | ORAL | Status: DC | PRN

## 2013-02-09 MED ORDER — BUPIVACAINE LIPOSOME 1.3 % IJ SUSP
INTRAMUSCULAR | Status: DC | PRN
  Administered 2013-02-09: 20 mL

## 2013-02-09 MED ORDER — ALBUTEROL SULFATE (5 MG/ML) 0.5% IN NEBU: 2.5000 mg | INHALATION_SOLUTION | Freq: Four times a day (QID) | RESPIRATORY_TRACT | Status: DC | PRN

## 2013-02-09 MED ORDER — ONDANSETRON HCL 4 MG/2ML IJ SOLN: 4.0000 mg | Freq: Four times a day (QID) | INTRAMUSCULAR | Status: DC | PRN

## 2013-02-09 MED ORDER — SENNA 8.6 MG PO TABS
2.0000 | ORAL_TABLET | Freq: Every day | ORAL | Status: DC
  Administered 2013-02-10 – 2013-02-13 (×5): 17.2 mg via ORAL
  Filled 2013-02-09: qty 1
  Filled 2013-02-09: qty 2
  Filled 2013-02-09: qty 1
  Filled 2013-02-09 (×3): qty 2

## 2013-02-09 MED ORDER — LIDOCAINE HCL (CARDIAC) 10 MG/ML IV SOLN
INTRAVENOUS | Status: DC | PRN
  Administered 2013-02-09: 100 mg via INTRAVENOUS

## 2013-02-09 MED ORDER — VANCOMYCIN HCL IN DEXTROSE 750-5 MG/150ML-% IV SOLN: 750.0000 mg | Freq: Once | INTRAVENOUS | Status: DC

## 2013-02-09 MED ORDER — HYDROMORPHONE HCL PF 1 MG/ML IJ SOLN: 0.2500 mg | INTRAMUSCULAR | Status: DC | PRN

## 2013-02-09 MED ORDER — FENTANYL CITRATE 0.05 MG/ML IJ SOLN
INTRAMUSCULAR | Status: DC | PRN
  Administered 2013-02-09: 100 ug via INTRAVENOUS
  Administered 2013-02-09: 50 ug via INTRAVENOUS

## 2013-02-09 MED ORDER — FUROSEMIDE 20 MG PO TABS: 20.0000 mg | ORAL_TABLET | Freq: Every day | ORAL | Status: DC

## 2013-02-09 MED ORDER — SODIUM CHLORIDE 0.9 % IV SOLN
INTRAVENOUS | Status: DC
  Administered 2013-02-09: 75 mL/h via INTRAVENOUS

## 2013-02-09 MED ORDER — CALCIUM CARBONATE-VITAMIN D 500-200 MG-UNIT PO TABS
1.0000 | ORAL_TABLET | Freq: Two times a day (BID) | ORAL | Status: DC
  Administered 2013-02-09 – 2013-02-13 (×8): 1 via ORAL
  Filled 2013-02-09 (×10): qty 1

## 2013-02-09 MED ORDER — OXYCODONE HCL 5 MG PO TABS
5.0000 mg | ORAL_TABLET | ORAL | Status: DC | PRN
  Administered 2013-02-09 – 2013-02-11 (×3): 5 mg via ORAL
  Filled 2013-02-09 (×4): qty 1

## 2013-02-09 MED ORDER — FERROUS FUMARATE 325 (106 FE) MG PO TABS
1.0000 | ORAL_TABLET | Freq: Two times a day (BID) | ORAL | Status: DC
  Administered 2013-02-09 – 2013-02-13 (×7): 106 mg via ORAL
  Filled 2013-02-09 (×9): qty 1

## 2013-02-09 MED ORDER — SODIUM CHLORIDE 0.9 % IV SOLN
INTRAVENOUS | Status: DC | PRN
  Administered 2013-02-09: 13:00:00 via INTRAVENOUS

## 2013-02-09 MED ORDER — BICALUTAMIDE 50 MG PO TABS
50.0000 mg | ORAL_TABLET | Freq: Every day | ORAL | Status: DC
  Administered 2013-02-10 – 2013-02-13 (×4): 50 mg via ORAL
  Filled 2013-02-09 (×4): qty 1

## 2013-02-09 SURGICAL SUPPLY — 54 items
BANDAGE ELASTIC 6 VELCRO ST LF (GAUZE/BANDAGES/DRESSINGS) ×1 IMPLANT
BANDAGE ESMARK 6X9 LF (GAUZE/BANDAGES/DRESSINGS) IMPLANT
BLADE SAW RECIP 87.9 MT (BLADE) ×2 IMPLANT
BNDG CMPR 9X6 STRL LF SNTH (GAUZE/BANDAGES/DRESSINGS) ×1
BNDG COHESIVE 6X5 TAN STRL LF (GAUZE/BANDAGES/DRESSINGS) ×4 IMPLANT
BNDG ESMARK 6X9 LF (GAUZE/BANDAGES/DRESSINGS) ×2
BNDG GAUZE STRTCH 6 (GAUZE/BANDAGES/DRESSINGS) IMPLANT
CHLORAPREP W/TINT 26ML (MISCELLANEOUS) ×2 IMPLANT
CLOTH BEACON ORANGE TIMEOUT ST (SAFETY) ×2 IMPLANT
COVER SURGICAL LIGHT HANDLE (MISCELLANEOUS) ×2 IMPLANT
CUFF TOURNIQUET SINGLE 34IN LL (TOURNIQUET CUFF) ×1 IMPLANT
CUFF TOURNIQUET SINGLE 44IN (TOURNIQUET CUFF) IMPLANT
DRAPE EXTREMITY T 121X128X90 (DRAPE) ×2 IMPLANT
DRAPE INCISE IOBAN 66X45 STRL (DRAPES) ×2 IMPLANT
DRAPE PROXIMA HALF (DRAPES) ×4 IMPLANT
DRAPE U-SHAPE 47X51 STRL (DRAPES) ×4 IMPLANT
DRSG MEPITEL 4X7.2 (GAUZE/BANDAGES/DRESSINGS) ×2 IMPLANT
ELECT CAUTERY BLADE 6.4 (BLADE) IMPLANT
ELECT REM PT RETURN 9FT ADLT (ELECTROSURGICAL) ×2
ELECTRODE REM PT RTRN 9FT ADLT (ELECTROSURGICAL) ×1 IMPLANT
EVACUATOR 1/8 PVC DRAIN (DRAIN) IMPLANT
GLOVE BIO SURGEON STRL SZ8 (GLOVE) ×2 IMPLANT
GLOVE BIOGEL PI IND STRL 8 (GLOVE) ×1 IMPLANT
GLOVE BIOGEL PI INDICATOR 8 (GLOVE) ×1
GOWN PREVENTION PLUS XLARGE (GOWN DISPOSABLE) ×2 IMPLANT
GOWN STRL NON-REIN LRG LVL3 (GOWN DISPOSABLE) ×2 IMPLANT
KIT BASIN OR (CUSTOM PROCEDURE TRAY) ×2 IMPLANT
KIT ROOM TURNOVER OR (KITS) ×2 IMPLANT
MANIFOLD NEPTUNE II (INSTRUMENTS) ×2 IMPLANT
NS IRRIG 1000ML POUR BTL (IV SOLUTION) ×2 IMPLANT
PACK GENERAL/GYN (CUSTOM PROCEDURE TRAY) ×2 IMPLANT
PAD ARMBOARD 7.5X6 YLW CONV (MISCELLANEOUS) ×4 IMPLANT
PAD CAST 4YDX4 CTTN HI CHSV (CAST SUPPLIES) ×1 IMPLANT
PADDING CAST COTTON 4X4 STRL (CAST SUPPLIES) ×2
PADDING CAST COTTON 6X4 STRL (CAST SUPPLIES) ×2 IMPLANT
PADDING CAST SYN 6 (CAST SUPPLIES) ×1
PADDING CAST SYNTHETIC 6X4 NS (CAST SUPPLIES) IMPLANT
SPLINT FIBERGLASS 4X30 (CAST SUPPLIES) ×1 IMPLANT
SPONGE GAUZE 4X4 12PLY (GAUZE/BANDAGES/DRESSINGS) ×2 IMPLANT
SPONGE LAP 18X18 X RAY DECT (DISPOSABLE) IMPLANT
STAPLER VISISTAT 35W (STAPLE) IMPLANT
STOCKINETTE IMPERVIOUS LG (DRAPES) IMPLANT
SUT MNCRL AB 3-0 PS2 18 (SUTURE) ×4 IMPLANT
SUT PDS 2 0 (SUTURE) ×3 IMPLANT
SUT PDS AB 0 CT 36 (SUTURE) ×1 IMPLANT
SUT PROLENE 3 0 PS 2 (SUTURE) ×1 IMPLANT
SUT PROLENE 3 0 SH 48 (SUTURE) IMPLANT
SUT SILK 2 0 (SUTURE) ×2
SUT SILK 2-0 18XBRD TIE 12 (SUTURE) ×1 IMPLANT
SWAB COLLECTION DEVICE MRSA (MISCELLANEOUS) IMPLANT
TOWEL OR 17X24 6PK STRL BLUE (TOWEL DISPOSABLE) ×2 IMPLANT
TOWEL OR 17X26 10 PK STRL BLUE (TOWEL DISPOSABLE) ×2 IMPLANT
TUBE ANAEROBIC SPECIMEN COL (MISCELLANEOUS) IMPLANT
WATER STERILE IRR 1000ML POUR (IV SOLUTION) ×2 IMPLANT

## 2013-02-09 NOTE — Anesthesia Procedure Notes (Signed)
Procedure Name: LMA Insertion Date/Time: 02/09/2013 1:37 PM Performed by: Orvilla Fus A Pre-anesthesia Checklist: Patient identified, Timeout performed, Emergency Drugs available, Patient being monitored and Suction available Patient Re-evaluated:Patient Re-evaluated prior to inductionOxygen Delivery Method: Circle system utilized Preoxygenation: Pre-oxygenation with 100% oxygen Intubation Type: IV induction Ventilation: Mask ventilation without difficulty LMA: LMA inserted LMA Size: 5.0 Number of attempts: 1 Placement Confirmation: breath sounds checked- equal and bilateral and positive ETCO2 Tube secured with: Tape Dental Injury: Teeth and Oropharynx as per pre-operative assessment

## 2013-02-09 NOTE — Consult Note (Signed)
[facility] Medical Consultation  Jesse Chandler RUE:[id number] DOB: 1924/08/05 DOA: 02/09/2013 PCP: [name], MD   Requesting physician: Dr Victorino Dike Date of consultation: 5-15 Reason for consultation: Management of chronic medical problems.   Impression/Recommendations 1. Diabetes: Will order SSI.  2. Hypertension: Continue with Cardizem. Hold lasix while NPO. He has history of bilateral pleural effusion will need to address tomorrow initiation of lasix.  3. CKD stage III: Patient Cr on 5-13 was at 1.5, at baseline. Will repeat renal function in am. Adjust lasix as needed.  4. Hypothyroidism: Continue with Synthroid.    5. History of paroxysmal A fib: Continue with Cardizem.  6. Chronic Ulcer left heel, Osteomyelitis of lateral Calcaneal Tuberosity: Patient S/P BK amputation 5-15. Will defer continuation of antibiotics post operatively to Dr Victorino Dike.   I will followup again tomorrow. Please contact me if I can be of assistance in the meanwhile. Thank you for this consultation.  Chief Complaint: Left foot chronic ulcer.   HPI: 79 year-old [place] American man with PMH significant for diabetes, dementia, strokes, chronic kidney stage III Cr range from  1.3 to 1.6, bed bound, Chronic ulcer left heel, admitted 5-15 for elective left  BK amputation for osteomyelitis of left calcaneal tuberosity. We were consulted to help with medical management. Patient seen after surgery, he is feeling well. He wants to eat. He denies dyspnea, chest pain, abdominal pain. No left LE pain.    Review of Systems: negative except as per HPI.   Past Medical History  Diagnosis Date  . Prostate cancer   . Hypertension   . Hypothyroidism   . Psychosis   . CKD (chronic kidney disease) stage 3, GFR 30-59 ml/min     Baseline creatinine 1.7  . Posttraumatic stress disorder   . Dementia   . Recurrent right pleural effusion     H/O pleural catheter  . PAF (paroxysmal atrial fibrillation)   . Diabetes  mellitus     no medication  . Anemia of chronic disease     s/p tx with erythropoietin and iron  . Catheter (urine) change required     Has indwelling foley  . MRSA (methicillin resistant staph aureus) culture positive 06-21-2012      10-18-2012 in [facility]  . Osteomyelitis   . Urinary obstruction     indwelling cath x 1 yr  . Urinary retention   . Pressure ulcer of heel left  . Dementia     w/o behavior disturbance  . Peripheral neuropathy   . GERD (gastroesophageal reflux disease)    Past Surgical History  Procedure Laterality Date  . Orchiectomy      bilateral  . Subdural hematoma evacuation via craniotomy    . Cholecystectomy    . Insertion of suprapubic catheter N/A 01/05/2013    Procedure: INSERTION OF SUPRAPUBIC CATHETER;  Surgeon: Marcine Matar, MD;  Location: [facility];  Service: Urology;  Laterality: N/A;   Social History:  reports that he has never smoked. He has never used smokeless tobacco. He reports that he does not drink alcohol or use illicit drugs.  No Known Allergies family history: Parents with history of diabetes.   Prior to Admission medications   Medication Sig Start Date End Date Taking? Authorizing Provider  amoxicillin-clavulanate (AUGMENTIN) 500-125 MG per tablet Take 1 tablet (500 mg total) by mouth 2 (two) times daily. 10/18/12  Yes Randall Hiss, MD  bicalutamide (CASODEX) 50 MG tablet Take 50 mg by mouth daily.  06/28/12  Yes Historical Provider, MD  calcium-vitamin D (OSCAL WITH D) 500-200 MG-UNIT per tablet Take 1 tablet by mouth 2 (two) times daily.   Yes Historical Provider, MD  diltiazem (CARDIZEM CD) 240 MG 24 hr capsule Take 240 mg by mouth daily.  06/28/12  Yes Historical Provider, MD  docusate sodium (COLACE) 100 MG capsule Take 100 mg by mouth 2 (two) times daily.    Yes Historical Provider, MD  doxycycline (VIBRA-TABS) 100 MG tablet Take 1 tablet (100 mg total) by mouth 2 (two) times daily. 10/18/12  Yes Randall Hiss, MD  ferrous fumarate (HEMOCYTE - 106 MG FE) 325 (106 FE) MG TABS Take 1 tablet by mouth 2 (two) times daily.   Yes Historical Provider, MD  fluconazole (DIFLUCAN) 150 MG tablet Take 150 mg by mouth daily.   Yes Historical Provider, MD  folic acid (FOLVITE) 1 MG tablet Take 1 mg by mouth daily.   Yes Historical Provider, MD  furosemide (LASIX) 20 MG tablet Take 20 mg by mouth daily.   Yes Historical Provider, MD  guaiFENesin (MUCINEX) 600 MG 12 hr tablet Take 1,200 mg by mouth 2 (two) times daily.   Yes Historical Provider, MD  guaiFENesin (ROBITUSSIN) 100 MG/5ML liquid Take 300 mg by mouth every 4 (four) hours as needed for cough.   Yes Historical Provider, MD  HYDROcodone-acetaminophen (NORCO/VICODIN) 5-325 MG per tablet Take 1 tablet by mouth every 4 (four) hours as needed for pain.  06/28/12  Yes Historical Provider, MD  insulin regular (NOVOLIN R,HUMULIN R) 100 units/mL injection Inject 0-10 Units into the skin 2 (two) times daily before a meal. Per sliding scale:  0-149 0 units, 150-200 2 units, 201-250 4 units, 251-300 6 units, 301-350 8 units, 351-400 10 units, over 400 call md   Yes Historical Provider, MD  ipratropium-albuterol (DUONEB) 0.5-2.5 (3) MG/3ML SOLN Take 3 mLs by nebulization every 6 (six) hours as needed (dyspnea).    Yes Historical Provider, MD  levothyroxine (SYNTHROID, LEVOTHROID) 50 MCG tablet Take 50 mcg by mouth daily.   Yes Historical Provider, MD  omeprazole (PRILOSEC) 40 MG capsule Take 40 mg by mouth daily.   Yes Historical Provider, MD  polyethylene glycol (MIRALAX / GLYCOLAX) packet Take 17 g by mouth daily. Dissolve in 4 oz of liquid and drink   Yes Historical Provider, MD  senna (SENOKOT) 8.6 MG tablet Take 1 tablet by mouth daily.   Yes Historical Provider, MD  sodium phosphate (FLEET) enema Place 1 enema rectally daily as needed (constipation). follow package directions   Yes Historical Provider, MD   Physical Exam: Blood pressure 135/52, pulse 62,  temperature 98 F (36.7 C), resp. rate 10, SpO2 100.00%. Filed Vitals:   02/09/13 1546 02/09/13 1600 02/09/13 1601 02/09/13 1604  BP: 129/53  135/52   Pulse: 63 63 62   Temp:    98 F (36.7 C)  Resp: 10 11 10    SpO2: 99% 100% 100%      General:  Awake, Alert in no acute distress.   Eyes: PERLA, EOM intact.  ENT: No tonsillar erythema or enlargement.   Neck: Supple no thyromegaly.   Cardiovascular: S 1, S 2 RRR  Respiratory: CTA  Abdomen: Bs present, soft, NT, supra pubic catheter in place.   Skin: no rashes.   Musculoskeletal: Left BK amputation with clean dressing. Wound right lower extremity.   Psychiatric: Normal Affect.   Neurologic: awake, alert, answering question. Move upper extremities. Cranial nerve grossly intact.  Labs on Admission:  Basic Metabolic Panel:  Recent Labs Lab 02/07/13 1557  NA 137  K 4.3  CL 103  CO2 23  GLUCOSE 143*  BUN 21  CREATININE 1.59*  CALCIUM 8.9   Liver Function Tests: No results found for this basename: AST, ALT, ALKPHOS, BILITOT, PROT, ALBUMIN,  in the last 168 hours No results found for this basename: LIPASE, AMYLASE,  in the last 168 hours No results found for this basename: AMMONIA,  in the last 168 hours CBC:  Recent Labs Lab 02/07/13 1557  WBC 8.4  HGB 9.6*  HCT 30.7*  MCV 89.0  PLT 222   Cardiac Enzymes: No results found for this basename: CKTOTAL, CKMB, CKMBINDEX, TROPONINI,  in the last 168 hours BNP: No components found with this basename: POCBNP,  CBG:  Recent Labs Lab 02/09/13 1203 02/09/13 1450  GLUCAP 91 107*    Radiological Exams on Admission: No results found.  Time spent: 75 minutes.   Brandi Armato [facility] Pager [phone]  If 7PM-7AM, please contact night-coverage [email or web address] Password [facility] 02/09/2013, 4:14 PM

## 2013-02-09 NOTE — Brief Op Note (Signed)
02/09/2013  2:41 PM  PATIENT:  Jesse Chandler  79 y.o. male  PRE-OPERATIVE DIAGNOSIS:  Left heel diabetic ulcer and calcaneal osteomyelitis  POST-OPERATIVE DIAGNOSIS:  same  Procedure(s): Left below knee amputation  SURGEON:  Toni Arthurs, MD  ASSISTANT: n/a  ANESTHESIA:   General, regional  EBL:  minimal   TOURNIQUET:   Total Tourniquet Time Documented: Thigh (Left) - 36 minutes Total: Thigh (Left) - 36 minutes   COMPLICATIONS:  None apparent  DISPOSITION:  Extubated, awake and stable to recovery.  DICTATION ID:  [id number]

## 2013-02-09 NOTE — Consult Note (Signed)
INFECTIOUS DISEASE CONSULT NOTE  Date of Admission:  02/09/2013  Date of Consult:  02/09/2013  Reason for Consult:Osteomyelitis Referring Physician: Victorino Dike  Impression/Recommendation Osteomyelitis, Diabetic Foot ulcer L BKA 02-09-13  Would- Place [facility] Treat pt with vancomycin and ceftaz Will hold on anaerobe coverage as he has been debrided/amputated  Comment- spoke with pt and family, plan to give him f/u therapy for 2 weeks. He will need to f/u in ID clinic near the end of his therapy to assess.    Thank you so much for this interesting consult,   Johny Sax [phone]  Jesse Chandler is an 79 y.o. male.  HPI: 79 yo M with hx of DM (? Years), dementia (bed bound), and L calcaneal osteomyelitis since July 2013. This started as a small blackened area and has progressed since. He has been followed at wound care center (MR 07-2012: Lateral calcaneal tuberosity osteomyelitis with ulceration and sinus tract extending from scan to bone surface. No soft tissue abscess.). He had swab cx at wound center that grew MRSA and Acinetobacter (sens - augmentin).  He was treated with doxy and augmentin and wound has progressed despite these. He is admitted today and underwent L BKA today.    Past Medical History  Diagnosis Date  . Prostate cancer   . Hypertension   . Hypothyroidism   . Psychosis   . CKD (chronic kidney disease) stage 3, GFR 30-59 ml/min     Baseline creatinine 1.7  . Posttraumatic stress disorder   . Dementia   . Recurrent right pleural effusion     H/O pleural catheter  . PAF (paroxysmal atrial fibrillation)   . Diabetes mellitus     no medication  . Anemia of chronic disease     s/p tx with erythropoietin and iron  . Catheter (urine) change required     Has indwelling foley  . MRSA (methicillin resistant staph aureus) culture positive 06-21-2012      10-18-2012 in [facility]  . Osteomyelitis   . Urinary obstruction     indwelling cath x 1 yr    . Urinary retention   . Pressure ulcer of heel left  . Dementia     w/o behavior disturbance  . Peripheral neuropathy   . GERD (gastroesophageal reflux disease)   . Refusal of blood transfusions as patient is Jehovah's Witness   . Shortness of breath     Past Surgical History  Procedure Laterality Date  . Orchiectomy      bilateral  . Subdural hematoma evacuation via craniotomy    . Cholecystectomy    . Insertion of suprapubic catheter N/A 01/05/2013    Procedure: INSERTION OF SUPRAPUBIC CATHETER;  Surgeon: Marcine Matar, MD;  Location: [facility];  Service: Urology;  Laterality: N/A;  . Leg amputation below knee Left 02/09/2013    Dr Victorino Dike     No Known Allergies  Medications:  Scheduled: . [START ON 02/10/2013] bicalutamide  50 mg Oral Daily  . calcium-vitamin D  1 tablet Oral BID  . [START ON 02/10/2013] diltiazem  240 mg Oral Daily  . docusate sodium  100 mg Oral BID  . enoxaparin (LOVENOX) injection  30 mg Subcutaneous Q24H  . ferrous fumarate  1 tablet Oral BID  . fluconazole  150 mg Oral Daily  . [START ON 02/10/2013] folic acid  1 mg Oral Daily  . guaiFENesin  1,200 mg Oral BID  . insulin aspart  0-9 Units Subcutaneous TID WC  . [  START ON 02/10/2013] levothyroxine  50 mcg Oral QAC breakfast  . polyethylene glycol  17 g Oral Daily  . senna  2 tablet Oral Daily  . [START ON 02/10/2013] vancomycin  1,500 mg Intravenous Q24H  . vancomycin  750 mg Intravenous Once    Total days of antibiotics : 1 (vanco)          Social History:  reports that he has quit smoking. He has never used smokeless tobacco. He reports that he does not drink alcohol or use illicit drugs.  History reviewed. No pertinent family history.  General ROS: blind in L eye, denies neuropathy, has supra-pubic cath for last month, normal BM, see HPI.   Blood pressure 134/57, pulse 65, temperature 97.6 F (36.4 C), resp. rate 18, SpO2 100.00%. General appearance: alert, cooperative and no  distress Eyes: EOMI Throat: normal findings: oropharynx pink & moist without lesions or evidence of thrush Lungs: clear to auscultation bilaterally Heart: regular rate and rhythm Abdomen: normal findings: bowel sounds normal and soft, non-tender Extremities: L BKA wrapped. R great toe with darkened area on distal tip.    Results for orders placed during the hospital encounter of 02/09/13 (from the past 48 hour(s))  GLUCOSE, CAPILLARY     Status: None   Collection Time    02/09/13 12:03 PM      Result Value Range   Glucose-Capillary 91  70 - 99 mg/dL  GLUCOSE, CAPILLARY     Status: Abnormal   Collection Time    02/09/13  2:50 PM      Result Value Range   Glucose-Capillary 107 (*) 70 - 99 mg/dL   Comment 1 Notify RN    CBC     Status: Abnormal   Collection Time    02/09/13  4:58 PM      Result Value Range   WBC 7.1  4.0 - 10.5 K/uL   RBC 3.63 (*) 4.22 - 5.81 MIL/uL   Hemoglobin 10.3 (*) 13.0 - 17.0 g/dL   HCT 16.1 (*) 09.6 - 04.5 %   MCV 88.7  78.0 - 100.0 fL   MCH 28.4  26.0 - 34.0 pg   MCHC 32.0  30.0 - 36.0 g/dL   RDW 40.9  81.1 - 91.4 %   Platelets 134 (*) 150 - 400 K/uL  GLUCOSE, CAPILLARY     Status: None   Collection Time    02/09/13  5:17 PM      Result Value Range   Glucose-Capillary 86  70 - 99 mg/dL   Comment 1 Notify RN        Component Value Date/Time   SDES URINE, RANDOM 12/09/2011 1833   SPECREQUEST NONE 12/09/2011 1833   CULT NO GROWTH 12/09/2011 1833   REPTSTATUS 12/10/2011 FINAL 12/09/2011 1833   No results found. Recent Results (from the past 240 hour(s))  SURGICAL PCR SCREEN     Status: None   Collection Time    02/07/13  3:58 PM      Result Value Range Status   MRSA, PCR NEGATIVE  NEGATIVE Final   Staphylococcus aureus NEGATIVE  NEGATIVE Final   Comment:            The Xpert SA Assay (FDA     approved for NASAL specimens     in patients over 17 years of age),     is one component of     a comprehensive surveillance     program.  Test  performance has  been validated by [facility] for patients greater     than or equal to 65 year old.     It is not intended     to diagnose infection nor to     guide or monitor treatment.      02/09/2013, 5:50 PM     LOS: 0 days

## 2013-02-09 NOTE — OR Nursing (Signed)
Patient has suprapubic catheter intact preop and postop

## 2013-02-09 NOTE — H&P (Signed)
Jesse Chandler is an 79 y.o. male.   Chief Complaint:  Left heel ulcer HPI:  79 y/o male with PMH of diabetes presents with left calcaneus osteomyelitis and left heel ulcer that has failed antibiotic treatment.  The patient presents now for left BKA.  Past Medical History  Diagnosis Date  . Prostate cancer   . Hypertension   . Hypothyroidism   . Psychosis   . CKD (chronic kidney disease) stage 3, GFR 30-59 ml/min     Baseline creatinine 1.7  . Posttraumatic stress disorder   . Dementia   . Recurrent right pleural effusion     H/O pleural catheter  . PAF (paroxysmal atrial fibrillation)   . Diabetes mellitus     no medication  . Anemia of chronic disease     s/p tx with erythropoietin and iron  . Catheter (urine) change required     Has indwelling foley  . MRSA (methicillin resistant staph aureus) culture positive 06-21-2012      10-18-2012 in [facility]  . Osteomyelitis   . Urinary obstruction     indwelling cath x 1 yr  . Urinary retention   . Pressure ulcer of heel left  . Dementia     w/o behavior disturbance  . Peripheral neuropathy   . GERD (gastroesophageal reflux disease)     Past Surgical History  Procedure Laterality Date  . Orchiectomy      bilateral  . Subdural hematoma evacuation via craniotomy    . Cholecystectomy    . Insertion of suprapubic catheter N/A 01/05/2013    Procedure: INSERTION OF SUPRAPUBIC CATHETER;  Surgeon: Marcine Matar, MD;  Location: [facility];  Service: Urology;  Laterality: N/A;    History reviewed. No pertinent family history. Social History:  reports that he has never smoked. He has never used smokeless tobacco. He reports that he does not drink alcohol or use illicit drugs.  Allergies: No Known Allergies  Medications Prior to Admission  Medication Sig Dispense Refill  . amoxicillin-clavulanate (AUGMENTIN) 500-125 MG per tablet Take 1 tablet (500 mg total) by mouth 2 (two) times daily.  60 tablet  11  .  bicalutamide (CASODEX) 50 MG tablet Take 50 mg by mouth daily.       . calcium-vitamin D (OSCAL WITH D) 500-200 MG-UNIT per tablet Take 1 tablet by mouth 2 (two) times daily.      Marland Kitchen diltiazem (CARDIZEM CD) 240 MG 24 hr capsule Take 240 mg by mouth daily.       Marland Kitchen docusate sodium (COLACE) 100 MG capsule Take 100 mg by mouth 2 (two) times daily.       Marland Kitchen doxycycline (VIBRA-TABS) 100 MG tablet Take 1 tablet (100 mg total) by mouth 2 (two) times daily.  60 tablet  11  . ferrous fumarate (HEMOCYTE - 106 MG FE) 325 (106 FE) MG TABS Take 1 tablet by mouth 2 (two) times daily.      . fluconazole (DIFLUCAN) 150 MG tablet Take 150 mg by mouth daily.      . folic acid (FOLVITE) 1 MG tablet Take 1 mg by mouth daily.      . furosemide (LASIX) 20 MG tablet Take 20 mg by mouth daily.      Marland Kitchen guaiFENesin (MUCINEX) 600 MG 12 hr tablet Take 1,200 mg by mouth 2 (two) times daily.      Marland Kitchen guaiFENesin (ROBITUSSIN) 100 MG/5ML liquid Take 300 mg by mouth every 4 (four) hours as  needed for cough.      Marland Kitchen HYDROcodone-acetaminophen (NORCO/VICODIN) 5-325 MG per tablet Take 1 tablet by mouth every 4 (four) hours as needed for pain.       Marland Kitchen insulin regular (NOVOLIN R,HUMULIN R) 100 units/mL injection Inject 0-10 Units into the skin 2 (two) times daily before a meal. Per sliding scale:  0-149 0 units, 150-200 2 units, 201-250 4 units, 251-300 6 units, 301-350 8 units, 351-400 10 units, over 400 call md      . ipratropium-albuterol (DUONEB) 0.5-2.5 (3) MG/3ML SOLN Take 3 mLs by nebulization every 6 (six) hours as needed (dyspnea).       Marland Kitchen levothyroxine (SYNTHROID, LEVOTHROID) 50 MCG tablet Take 50 mcg by mouth daily.      Marland Kitchen omeprazole (PRILOSEC) 40 MG capsule Take 40 mg by mouth daily.      . polyethylene glycol (MIRALAX / GLYCOLAX) packet Take 17 g by mouth daily. Dissolve in 4 oz of liquid and drink      . senna (SENOKOT) 8.6 MG tablet Take 1 tablet by mouth daily.      . sodium phosphate (FLEET) enema Place 1 enema rectally daily  as needed (constipation). follow package directions        Results for orders placed during the hospital encounter of Mar 04, 2013 (from the past 48 hour(s))  NO BLOOD PRODUCTS     Status: None   Collection Time    02/07/13  5:09 PM      Result Value Range   Transfuse no blood products       Value: TRANSFUSE NO BLOOD PRODUCTS, VERIFIED BY KRISTI MOORE, RN  GLUCOSE, CAPILLARY     Status: None   Collection Time    Mar 04, 2013 12:03 PM      Result Value Range   Glucose-Capillary 91  70 - 99 mg/dL   No results found.  ROS  No recent f/c/n/v/wt loss.  Physical Exam wn wd male in nad.  A and O to person.  EOMI.  resp unlabored.  Mood and afffect normal.  L heel with large plantar ulcer.  Skin o/w intact.  Sens to LT diminished at the forefoot.  5/5 strength in PF and DF of the ankle.    Assessment/Plan Left heel ulcer with calcaneal osteomyelitis.  To OR for Left BKA.  The risks and benefits of the alternative treatment options have been discussed in detail.  The patient wishes to proceed with surgery and specifically understands risks of bleeding, infection, nerve damage, blood clots, need for additional surgery, amputation and death.   Toni Arthurs 2013/03/04, 1:00 PM

## 2013-02-09 NOTE — Anesthesia Preprocedure Evaluation (Addendum)
Anesthesia Evaluation  Patient identified by MRN, date of birth, ID band Patient confused    Reviewed: Allergy & Precautions, H&P , NPO status , Patient's Chart, lab work & pertinent test results  Airway Mallampati: II  Neck ROM: Full    Dental  (+) Poor Dentition and Dental Advisory Given   Pulmonary neg pulmonary ROS,    Pulmonary exam normal       Cardiovascular hypertension, + dysrhythmias Atrial Fibrillation     Neuro/Psych PSYCHIATRIC DISORDERS negative neurological ROS     GI/Hepatic Neg liver ROS, GERD-  Medicated,  Endo/Other  diabetes, Insulin DependentHypothyroidism   Renal/GU Renal InsufficiencyRenal disease     Musculoskeletal   Abdominal   Peds  Hematology negative hematology ROS (+)   Anesthesia Other Findings   Reproductive/Obstetrics                          Anesthesia Physical Anesthesia Plan  ASA: III  Anesthesia Plan: General   Post-op Pain Management:    Induction: Intravenous  Airway Management Planned: LMA  Additional Equipment:   Intra-op Plan:   Post-operative Plan: Extubation in OR  Informed Consent: I have reviewed the patients History and Physical, chart, labs and discussed the procedure including the risks, benefits and alternatives for the proposed anesthesia with the patient or authorized representative who has indicated his/her understanding and acceptance.   Consent reviewed with POA and Dental advisory given  Plan Discussed with: CRNA, Anesthesiologist and Surgeon  Anesthesia Plan Comments:        Anesthesia Quick Evaluation

## 2013-02-09 NOTE — Progress Notes (Signed)
Day of Surgery Subjective: Patient reports Had S/P tube placement by Dr Retta Diones on 4/10.  Due for tube exchange next week.  Had left BKA.  Transportation will be an issue for the patient next week. I will replace S/P tube today.   Objective: Vital signs in last 24 hours: Temp:  [97.6 F (36.4 C)-98 F (36.7 C)] 97.6 F (36.4 C) (05/15 1624) Pulse Rate:  [60-65] 65 (05/15 1624) Resp:  [10-18] 18 (05/15 1624) BP: (126-136)/(45-57) 134/57 mmHg (05/15 1624) SpO2:  [99 %-100 %] 100 % (05/15 1624)  Intake/Output from previous day:   Intake/Output this shift:    Physical Exam:  S/P tube draining well.  Lab Results:  Recent Labs  02/07/13 1557 02/09/13 1658  HGB 9.6* 10.3*  HCT 30.7* 32.2*   BMET  Recent Labs  02/07/13 1557 02/09/13 1658  NA 137  --   K 4.3  --   CL 103  --   CO2 23  --   GLUCOSE 143*  --   BUN 21  --   CREATININE 1.59* 1.21  CALCIUM 8.9  --    No results found for this basename: LABPT, INR,  in the last 72 hours No results found for this basename: LABURIN,  in the last 72 hours Results for orders placed during the hospital encounter of 02/07/13  SURGICAL PCR SCREEN     Status: None   Collection Time    02/07/13  3:58 PM      Result Value Range Status   MRSA, PCR NEGATIVE  NEGATIVE Final   Staphylococcus aureus NEGATIVE  NEGATIVE Final   Comment:            The Xpert SA Assay (FDA     approved for NASAL specimens     in patients over 28 years of age),     is one component of     a comprehensive surveillance     program.  Test performance has     been validated by [employer] for patients greater     than or equal to 61 year old.     It is not intended     to diagnose infection nor to     guide or monitor treatment.    Studies/Results: No results found.  Assessment/Plan: S/P:  S/P tube placement  I removed the suture that was attached to the S/P tube.  S/P tube replaced with # 22 Fr 5 cc balloon without difficulty.    LOS: 0  days   Jesse Chandler 02/09/2013, 8:53 PM

## 2013-02-09 NOTE — Progress Notes (Signed)
Call to [facility], complete review of meds.- DONE

## 2013-02-09 NOTE — Progress Notes (Signed)
Arrived to room 6n27 from pacu, a/ox3, denies nausea/pain at this time, daughter at bedside.

## 2013-02-09 NOTE — Transfer of Care (Signed)
Immediate Anesthesia Transfer of Care Note  Patient: Jesse Chandler  Procedure(s) Performed: Procedure(s): LEFT AMPUTATION BELOW KNEE (Left)  Patient Location: PACU  Anesthesia Type:General  Level of Consciousness: awake, alert , oriented and patient cooperative  Airway & Oxygen Therapy: Patient Spontanous Breathing and Patient connected to nasal cannula oxygen  Post-op Assessment: Report given to PACU RN, Post -op Vital signs reviewed and stable and Patient moving all extremities X 4  Post vital signs: Reviewed and stable  Complications: No apparent anesthesia complications

## 2013-02-09 NOTE — Progress Notes (Addendum)
ANTIBIOTIC CONSULT NOTE - INITIAL  Pharmacy Consult for vancomycin Indication: surgical prophylaxis  No Known Allergies  Patient Measurements: Wt= 106.6kg  Vital Signs: Temp: 97.6 F (36.4 C) (05/15 1624) BP: 134/57 mmHg (05/15 1624) Pulse Rate: 65 (05/15 1624) Intake/Output from previous day:   Intake/Output from this shift: Total I/O In: 250 [I.V.:250] Out: 150 [Urine:150]  Labs:  Recent Labs  02/07/13 1557 02/09/13 1658  WBC 8.4 7.1  HGB 9.6* 10.3*  PLT 222 134*  CREATININE 1.59*  --     Medical History: Past Medical History  Diagnosis Date  . Prostate cancer   . Hypertension   . Hypothyroidism   . Psychosis   . CKD (chronic kidney disease) stage 3, GFR 30-59 ml/min     Baseline creatinine 1.7  . Posttraumatic stress disorder   . Dementia   . Recurrent right pleural effusion     H/O pleural catheter  . PAF (paroxysmal atrial fibrillation)   . Diabetes mellitus     no medication  . Anemia of chronic disease     s/p tx with erythropoietin and iron  . Catheter (urine) change required     Has indwelling foley  . MRSA (methicillin resistant staph aureus) culture positive 06-21-2012      10-18-2012 in Epic [facility]  . Osteomyelitis   . Urinary obstruction     indwelling cath x 1 yr  . Urinary retention   . Pressure ulcer of heel left  . Dementia     w/o behavior disturbance  . Peripheral neuropathy   . GERD (gastroesophageal reflux disease)     Medications:  Prescriptions prior to admission  Medication Sig Dispense Refill  . amoxicillin-clavulanate (AUGMENTIN) 500-125 MG per tablet Take 1 tablet (500 mg total) by mouth 2 (two) times daily.  60 tablet  11  . bicalutamide (CASODEX) 50 MG tablet Take 50 mg by mouth daily.       . calcium-vitamin D (OSCAL WITH D) 500-200 MG-UNIT per tablet Take 1 tablet by mouth 2 (two) times daily.      Marland Kitchen diltiazem (CARDIZEM CD) 240 MG 24 hr capsule Take 240 mg by mouth daily.       Marland Kitchen docusate sodium  (COLACE) 100 MG capsule Take 100 mg by mouth 2 (two) times daily.       Marland Kitchen doxycycline (VIBRA-TABS) 100 MG tablet Take 1 tablet (100 mg total) by mouth 2 (two) times daily.  60 tablet  11  . ferrous fumarate (HEMOCYTE - 106 MG FE) 325 (106 FE) MG TABS Take 1 tablet by mouth 2 (two) times daily.      . fluconazole (DIFLUCAN) 150 MG tablet Take 150 mg by mouth daily.      . folic acid (FOLVITE) 1 MG tablet Take 1 mg by mouth daily.      . furosemide (LASIX) 20 MG tablet Take 20 mg by mouth daily.      Marland Kitchen guaiFENesin (MUCINEX) 600 MG 12 hr tablet Take 1,200 mg by mouth 2 (two) times daily.      Marland Kitchen guaiFENesin (ROBITUSSIN) 100 MG/5ML liquid Take 300 mg by mouth every 4 (four) hours as needed for cough.      Marland Kitchen HYDROcodone-acetaminophen (NORCO/VICODIN) 5-325 MG per tablet Take 1 tablet by mouth every 4 (four) hours as needed for pain.       Marland Kitchen insulin regular (NOVOLIN R,HUMULIN R) 100 units/mL injection Inject 0-10 Units into the skin 2 (two) times daily before a meal.  Per sliding scale:  0-149 0 units, 150-200 2 units, 201-250 4 units, 251-300 6 units, 301-350 8 units, 351-400 10 units, over 400 call md      . ipratropium-albuterol (DUONEB) 0.5-2.5 (3) MG/3ML SOLN Take 3 mLs by nebulization every 6 (six) hours as needed (dyspnea).       Marland Kitchen levothyroxine (SYNTHROID, LEVOTHROID) 50 MCG tablet Take 50 mcg by mouth daily.      Marland Kitchen omeprazole (PRILOSEC) 40 MG capsule Take 40 mg by mouth daily.      . polyethylene glycol (MIRALAX / GLYCOLAX) packet Take 17 g by mouth daily. Dissolve in 4 oz of liquid and drink      . senna (SENOKOT) 8.6 MG tablet Take 1 tablet by mouth daily.      . sodium phosphate (FLEET) enema Place 1 enema rectally daily as needed (constipation). follow package directions       Scheduled:  . [START ON 02/10/2013] bicalutamide  50 mg Oral Daily  . calcium-vitamin D  1 tablet Oral BID  . [START ON 02/10/2013] diltiazem  240 mg Oral Daily  . docusate sodium  100 mg Oral BID  . enoxaparin  (LOVENOX) injection  30 mg Subcutaneous Q24H  . ferrous fumarate  1 tablet Oral BID  . fluconazole  150 mg Oral Daily  . [START ON 02/10/2013] folic acid  1 mg Oral Daily  . guaiFENesin  1,200 mg Oral BID  . insulin aspart  0-9 Units Subcutaneous TID WC  . [START ON 02/10/2013] levothyroxine  50 mcg Oral QAC breakfast  . polyethylene glycol  17 g Oral Daily  . senna  2 tablet Oral Daily   Assessment: 79 yo male noted s/p L BKA for calcaneal osteomyelitis/diabetic foot ulcer to start vancomycin for surgical prophylaxis. SCr= 1.59 on 02/07/13 and CrCl ~ 45.  Patient received vancomycin 1000mg  IV at 1:38pm today.   Goal of Therapy:  Vancomycin trough 15-20  Plan:  -Will give 750mg  IV now to complete a load of 1750mg  then continue vancomycin at 1500mg  IV q24hr -Will follow renal function and patient progress -BMET in am  Harland German, Pharm D 02/09/2013 5:46 PM   Addenum  -Adding fortaz as patient noted with wound cultures with acinetobacter (cultures at wound center). Per ID noted for 2 weeks of antibiotics  Plan -Elita Quick 2gm IV q12h  Harland German, Pharm D 02/09/2013 6:36 PM

## 2013-02-09 NOTE — Preoperative (Signed)
Beta Blockers   Reason not to administer Beta Blockers:Not Applicable 

## 2013-02-10 ENCOUNTER — Encounter (HOSPITAL_COMMUNITY): Payer: Self-pay | Admitting: Orthopedic Surgery

## 2013-02-10 LAB — GLUCOSE, CAPILLARY
Glucose-Capillary: 75 mg/dL (ref 70–99)
Glucose-Capillary: 66 mg/dL — ABNORMAL LOW (ref 70–99)
Glucose-Capillary: 70 mg/dL (ref 70–99)
Glucose-Capillary: 86 mg/dL (ref 70–99)

## 2013-02-10 LAB — BASIC METABOLIC PANEL
BUN: 16 mg/dL (ref 6–23)
GFR calc Af Amer: 53 mL/min — ABNORMAL LOW (ref >90)
GFR calc non Af Amer: 46 mL/min — ABNORMAL LOW (ref >90)
GFR calc non Af Amer: 46 mL/min — ABNORMAL LOW (ref >90)
Glucose, Bld: 85 mg/dL (ref 70–99)
Glucose, Bld: 76 mg/dL (ref 70–99)
Potassium: 4.6 mEq/L (ref 3.5–5.1)
Potassium: 5.1 mEq/L (ref 3.5–5.1)
Sodium: 136 mEq/L (ref 135–145)

## 2013-02-10 LAB — CBC
HCT: 28.7 % — ABNORMAL LOW (ref 39.0–52.0)
Hemoglobin: 9 g/dL — ABNORMAL LOW (ref 13.0–17.0)
MCHC: 31.4 g/dL (ref 30.0–36.0)

## 2013-02-10 MED ORDER — ENOXAPARIN SODIUM 40 MG/0.4ML ~~LOC~~ SOLN
40.0000 mg | SUBCUTANEOUS | Status: DC
  Administered 2013-02-10 – 2013-02-12 (×3): 40 mg via SUBCUTANEOUS
  Filled 2013-02-10 (×3): qty 0.4

## 2013-02-10 MED ORDER — FUROSEMIDE 20 MG PO TABS
20.0000 mg | ORAL_TABLET | Freq: Every day | ORAL | Status: DC
  Administered 2013-02-10 – 2013-02-13 (×4): 20 mg via ORAL
  Filled 2013-02-10 (×4): qty 1

## 2013-02-10 MED ORDER — GLUCOSE 40 % PO GEL
ORAL | Status: AC
  Administered 2013-02-10: 37.5 g
  Filled 2013-02-10: qty 1

## 2013-02-10 NOTE — Progress Notes (Signed)
Subjective: 1 Day Post-Op Procedure(s) (LRB): LEFT AMPUTATION BELOW KNEE (Left) Patient reports pain as mild.  Supra pubic catheter changed yesterday by Dr. Brunilda Payor.  Appreciate abx recs by dr hatcher.  Objective: Vital signs in last 24 hours: Temp:  [97.6 F (36.4 C)-98.8 F (37.1 C)] 98.8 F (37.1 C) (05/16 1006) Pulse Rate:  [55-65] 61 (05/16 1100) Resp:  [10-20] 20 (05/16 1006) BP: (109-136)/(45-57) 118/45 mmHg (05/16 1100) SpO2:  [96 %-100 %] 96 % (05/16 1006)  Intake/Output from previous day: 05/15 0701 - 05/16 0700 In: 892.5 [P.O.:240; I.V.:652.5] Out: 900 [Urine:900] Intake/Output this shift:     Recent Labs  02/07/13 1557 02/09/13 1658 02/10/13 0620  HGB 9.6* 10.3* 9.0*    Recent Labs  02/09/13 1658 02/10/13 0620  WBC 7.1 7.5  RBC 3.63* 3.22*  HCT 32.2* 28.7*  PLT 134* 216    Recent Labs  02/07/13 1557 02/09/13 1658 02/10/13 0620  NA 137  --  136  K 4.3  --  4.6  CL 103  --  105  CO2 23  --  21  BUN 21  --  16  CREATININE 1.59* 1.21 1.33  GLUCOSE 143*  --  85  CALCIUM 8.9  --  8.3*   No results found for this basename: LABPT, INR,  in the last 72 hours  R LE dressed and dry.  Pt appears well.  Assessment/Plan: 1 Day Post-Op Procedure(s) (LRB): LEFT AMPUTATION BELOW KNEE (Left) Up with therapy  OT eval ordered.  SNF placement Monday.  Continue IV abx per Dr. Moshe Cipro recs.  Toni Arthurs 02/10/2013, 1:30 PM

## 2013-02-10 NOTE — Op Note (Signed)
Jesse Chandler, Jesse Chandler                ACCOUNT NO.:  [id number]  MEDICAL RECORD NO.:  [id number]  LOCATION:  [id number]                        FACILITY:  [facility]  PHYSICIAN:  Toni Arthurs, MD        DATE OF BIRTH:  10-15-1923  DATE OF PROCEDURE:  02/09/2013 DATE OF DISCHARGE:                              OPERATIVE REPORT   PREOPERATIVE DIAGNOSIS:  Left heel diabetic ulcer and calcaneal osteomyelitis.  POSTOPERATIVE DIAGNOSIS:  Left heel diabetic ulcer and calcaneal osteomyelitis.  PROCEDURE:  Left below-knee amputation.  SURGEON:  Toni Arthurs, MD  ANESTHESIA:  General, regional.  ESTIMATED BLOOD LOSS:  Minimal.  TOURNIQUET TIME:  36 minutes at 300 mmHg.  COMPLICATIONS:  None apparent.  DISPOSITION:  Extubated awake and stable to recovery.  SPECIMEN:  Left leg to Pathology.  INDICATIONS FOR PROCEDURE:  The patient is an 79 year old male with past medical history significant for diabetes.  He has a long history of left heel ulcer and has developed osteomyelitis of his calcaneus.  He has failed antibiotic treatment for this condition and presents now for left below-knee amputation.  He understands the risks, benefits, alternative treatment options and elects surgical treatment.  He specifically understands the risks of bleeding, infection, nerve damage, blood clots, need for additional surgery, revision amputation, and death.  PROCEDURE IN DETAIL:  After preoperative consent was obtained, the correct operative site was identified.  The patient was brought to the operating room and placed supine on the operating table.  General anesthesia was induced.  Preoperative antibiotics were administered. Surgical time-out was taken.  The left lower extremity was prepped and draped in the standard sterile fashion and tourniquet around the thigh. The extremity was exsanguinated, and the tourniquet was inflated to 300 mmHg.  A posterior flap incision was marked on the skin, 15 cm distal from  the medial joint line.  The incision was made.  Sharp dissection was carried down through the skin and subcutaneous tissue.  The anterior compartment musculature was divided.  The peroneal vessels were doubly suture ligated with 2-0 silk suture.  The saphenous vein was similarly tied with silk suture.  The periosteum was elevated at the anterior tibia, and a reciprocating saw was used to cut through the tibia approximately 1 cm from the skin incision.  The fibula was then exposed and cut with the reciprocating saw, about 2 cm proximal from the tibial cut.  The amputation knife was then used to contour the posterior flap, and leg was passed off the field as a specimen to Pathology.  The named nerves were all cut with a #10 blade while being held on gentle traction and allowed to retract into the soft tissue envelope.  The named vessels were all doubly suture ligated with 2-0 silk ties.  Wound was irrigated copiously after bevelling the bone cut with a rasp.  The gastrocnemius fascia was then repaired to the anterior periosteum of the tibia with imbricating sutures of 0 PDS.  The gastrocnemius fascia was then repaired to the anterior compartment fascia with 0 PDS figure-of-eight sutures.  Subcutaneous tissue was approximated with inverted simple sutures of 3-0 Monocryl, and staples were used to close the skin  incision.  Sterile dressings were applied followed by a posterior splint, and a six-inch Ace wrap.  The tourniquet was released at 36 minutes after application of the dressings.  The patient was awakened from anesthesia and transported to the recovery room in stable condition.  FOLLOWUP PLAN:  The patient will be nonweightbearing on the left lower extremity.  He will be admitted to the hospital for this inpatient only procedure.  He will have infectious disease and the hospitalist consults.     Toni Arthurs, MD     JH/MEDQ  D:  02/09/2013  T:  02/10/2013  Job:  [id number]

## 2013-02-10 NOTE — Consult Note (Signed)
WOC consult Note Reason for Consult: evaluation of right LE wound.  Pt poor historian, not clear if this was an area he sustained trauma or other etiology.   Wound type: RLE leg ulceration Measurement: 1.5cm x 1.5cm x 0.2cm  Wound bed: pink, moist, minimal yellow exudate Drainage (amount, consistency, odor) minimal Periwound: intact, no s/s of infection Dressing procedure/placement/frequency: continue silicone foam dressing to protect, insulate and absorb any drainage.    Re consult if needed, will not follow at this time. Thanks  Katianne Barre [employer], CWOCN [phone])

## 2013-02-10 NOTE — Progress Notes (Signed)
[facility] PROGRESS NOTE  Jesse Chandler ZOX:[id number] DOB: 1923-11-17 DOA: 02/09/2013 PCP: [name], MD  Assessment/Plan: 1. Diabetes: Overnight the patient's blood sugars appear to be stable and well control. We will continue the current regimen. 2. Hypertension: Blood pressure is currently very well-controlled. We'll continue the current regimen. 3. Hypothyroid: The patient is continued on Synthroid as per his home regimen. I will also check a TSH while in the hospital 4. History of paroxysmal atrial fibrillation: Patient is continued on Cardizem and his rate controlled. 5. Left heel ulcer, osteomyelitis: The patient is status post left BKA. Dr. Ninetta Lights from the infectious disease service has been consulted for antibiotic recommendations and has suggested vancomycin with Ceptaz for 2 weeks.    HPI/Subjective: The patient is awake and denies any complaints  Objective: Filed Vitals:   02/09/13 2205 02/10/13 0210 02/10/13 0650 02/10/13 1006  BP: 117/53 109/49 124/52 118/45  Pulse: 55 58 60 61  Temp: 98.6 F (37 C) 97.6 F (36.4 C) 97.9 F (36.6 C) 98.8 F (37.1 C)  TempSrc: Oral   Oral  Resp: 17 16 16 20   SpO2: 100% 100% 100% 96%    Intake/Output Summary (Last 24 hours) at 02/10/13 1021 Last data filed at 02/10/13 0650  Gross per 24 hour  Intake  892.5 ml  Output    900 ml  Net   -7.5 ml   There were no vitals filed for this visit.  Exam:   General:  Patient awake alert in no apparent distress  Cardiovascular: Regular S1-S2 no murmur  Respiratory: Normal respiratory effort no crackles no wheezing  Abdomen: Soft nontender  Musculoskeletal: We'll perfused no clubbing or cyanosis   Data Reviewed: Basic Metabolic Panel:  Recent Labs Lab 02/07/13 1557 02/09/13 1658 02/10/13 0620  NA 137  --  136  K 4.3  --  4.6  CL 103  --  105  CO2 23  --  21  GLUCOSE 143*  --  85  BUN 21  --  16  CREATININE 1.59* 1.21 1.33  CALCIUM 8.9  --  8.3*   Liver  Function Tests: No results found for this basename: AST, ALT, ALKPHOS, BILITOT, PROT, ALBUMIN,  in the last 168 hours No results found for this basename: LIPASE, AMYLASE,  in the last 168 hours No results found for this basename: AMMONIA,  in the last 168 hours CBC:  Recent Labs Lab 02/07/13 1557 02/09/13 1658 02/10/13 0620  WBC 8.4 7.1 7.5  HGB 9.6* 10.3* 9.0*  HCT 30.7* 32.2* 28.7*  MCV 89.0 88.7 89.1  PLT 222 134* 216   Cardiac Enzymes: No results found for this basename: CKTOTAL, CKMB, CKMBINDEX, TROPONINI,  in the last 168 hours BNP (last 3 results) No results found for this basename: PROBNP,  in the last 8760 hours CBG:  Recent Labs Lab 02/09/13 1203 02/09/13 1450 02/09/13 1717 02/09/13 2204 02/10/13 0746  GLUCAP 91 107* 86 95 75    Recent Results (from the past 240 hour(s))  SURGICAL PCR SCREEN     Status: None   Collection Time    02/07/13  3:58 PM      Result Value Range Status   MRSA, PCR NEGATIVE  NEGATIVE Final   Staphylococcus aureus NEGATIVE  NEGATIVE Final   Comment:            The Xpert SA Assay (FDA     approved for NASAL specimens     in patients over 74 years of age),  is one component of     a comprehensive surveillance     program.  Test performance has     been validated by [facility] for patients greater     than or equal to 9 year old.     It is not intended     to diagnose infection nor to     guide or monitor treatment.     Studies: No results found.  Scheduled Meds: . bicalutamide  50 mg Oral Daily  . calcium-vitamin D  1 tablet Oral BID  . cefTAZidime (FORTAZ)  IV  2 g Intravenous Q12H  . diltiazem  240 mg Oral Daily  . docusate sodium  100 mg Oral BID  . enoxaparin (LOVENOX) injection  30 mg Subcutaneous Q24H  . ferrous fumarate  1 tablet Oral BID  . fluconazole  150 mg Oral Daily  . folic acid  1 mg Oral Daily  . guaiFENesin  1,200 mg Oral BID  . insulin aspart  0-9 Units Subcutaneous TID WC  . levothyroxine   50 mcg Oral QAC breakfast  . polyethylene glycol  17 g Oral Daily  . senna  2 tablet Oral Daily  . vancomycin  1,500 mg Intravenous Q24H  . vancomycin  750 mg Intravenous Once   Continuous Infusions: . sodium chloride 75 mL/hr (02/09/13 1533)    Active Problems:   Hypertension   Paroxysmal a-fib   Hypothyroidism   Diabetic foot ulcer    Time spent: 20 minutes    CHIU, STEPHEN K  [facility] Pager [phone]. If 7PM-7AM, please contact night-coverage at [email or web address], password [facility] 02/10/2013, 10:21 AM  LOS: 1 day

## 2013-02-10 NOTE — Progress Notes (Signed)
Pharmacy:  Vancomycin 750 mg was ordered for yesterday 15/15 for 1700 to make a total loading dose of 1750 mg. This dose was NOT charted as given.  Dose was not found in pt's room or in pt's med bin.  RN did not have pt yesterday and does not know if vanc was given. Plan: move maintenance dose of 1500 mg up from 1800 to 1300. Herby Abraham, Pharm.D. [phone] 02/10/2013 11:27 AM

## 2013-02-10 NOTE — Anesthesia Postprocedure Evaluation (Signed)
Anesthesia Post Note  Patient: Jesse Chandler  Procedure(s) Performed: Procedure(s) (LRB): LEFT AMPUTATION BELOW KNEE (Left)  Anesthesia type: general  Patient location: PACU  Post pain: Pain level controlled  Post assessment: Patient's Cardiovascular Status Stable  Last Vitals:  Filed Vitals:   02/10/13 0650  BP: 124/52  Pulse: 60  Temp: 36.6 C  Resp: 16    Post vital signs: Reviewed and stable  Level of consciousness: sedated  Complications: No apparent anesthesia complications

## 2013-02-10 NOTE — Clinical Social Work Psychosocial (Addendum)
    Clinical Social Work Department BRIEF PSYCHOSOCIAL ASSESSMENT 02/10/2013  Patient:  Jesse Chandler, Jesse Chandler     Account Number:  [id number]     Admit date:  02/09/2013  Clinical Social Worker:  Hulan Fray  Date/Time:  02/10/2013 10:23 AM  Referred by:  Physician  Date Referred:  02/09/2013 Referred for  SNF Placement   Other Referral:   Interview type:  Patient Other interview type:   Daughter- [name] Elissa Hefty    PSYCHOSOCIAL DATA Living Status:  FACILITY Admitted from facility:  [facility], [place] Level of care:  Skilled Nursing Facility Primary support name:  Diangelo Radel Primary support relationship to patient:  CHILD, ADULT Degree of support available:   supportive    CURRENT CONCERNS Current Concerns  Post-Acute Placement   Other Concerns:    SOCIAL WORK ASSESSMENT / PLAN Clinical Social Worker received referral for patient being from SNF, [facility]. CSW introduced self and explained reason for visit. Patient confirmed he was admitted from [facility] and was there for short term rehab. Patient gave CSW permission to speak with daughter, Barron Alvine as well. Daughter confirmed placement and plan for return at discharge when medically stable.    Fl2 is completed for MD's signature in shadow chart. CSW will facilitate discharge when patient is medically stable for discharge.   Assessment/plan status:  Psychosocial Support/Ongoing Assessment of Needs Other assessment/ plan:    [employer] clinicals have been sent to insurance for prior approval for SNF placement.  Information/referral to community resources:   Patient is from facility    PATIENT'S/FAMILY'S RESPONSE TO PLAN OF CARE: Patient and daughter reported that plans are to return back to [facility] at discharge. Daughter was appreciative of CSW's call and assistance.

## 2013-02-10 NOTE — Progress Notes (Signed)
UR completed 

## 2013-02-10 NOTE — Evaluation (Signed)
.pPhysical Therapy Evaluation Patient Details Name: Jesse Chandler MRN: [id number] DOB: [birth year] Today's Date: 02/10/2013 Time: 4098-1191 PT Time Calculation (min): 26 min  PT Assessment / Plan / Recommendation Clinical Impression    Pt admitted with osteomyelitis of Lt heel and underwent Lt BKA 02/09/13. Pt currently with functional limitations due to the deficits listed below (PT Problem List). Pt will benefit from skilled PT to increase their independence and safety with mobility to allow discharge to SNF for continued rehab.     PT Assessment  Patient needs continued PT services    Follow Up Recommendations  SNF;Supervision/Assistance - 24 hour    Does the patient have the potential to tolerate intense rehabilitation      Barriers to Discharge Inaccessible home environment;Decreased caregiver support plan to return to SNF per daughter    Equipment Recommendations  Wheelchair (measurements PT);Wheelchair cushion (measurements PT);Other (comment) (sliding board)    Recommendations for Other Services     Frequency Min 2X/week    Precautions / Restrictions Precautions Precautions: Fall Required Braces or Orthoses: Other Brace/Splint Other Brace/Splint: posterior splint to Lt BKA under postop dressing Restrictions Weight Bearing Restrictions: No   Pertinent Vitals/Pain Lt LE (pt unable to rate); repositioned for comfort       Mobility  Bed Mobility Bed Mobility: Rolling Right;Rolling Left Rolling Right: 1: +1 Total assist;With rail Rolling Left: 1: +1 Total assist;With rail Details for Bed Mobility Assistance: pt very slow to process instructions and requires hand-over-hand guidance; unable to attempt sitting EOB with 1 person assist Transfers Transfers: Not assessed Ambulation/Gait Ambulation/Gait Assistance: Not tested (comment)    Exercises General Exercises - Lower Extremity Ankle Circles/Pumps: AAROM;Right;5 reps;Supine Heel Slides: AAROM;Right;5  reps;Supine Straight Leg Raises: AAROM;Left;5 reps;Supine Other Exercises Other Exercises: 1/2 bridging with RLE--pt able to initiate lifting hips off bed (unable to clear buttocks)   PT Diagnosis: Generalized weakness;Acute pain  PT Problem List: Decreased strength;Decreased range of motion;Decreased activity tolerance;Decreased mobility;Decreased knowledge of use of DME;Pain PT Treatment Interventions: DME instruction;Functional mobility training;Therapeutic activities;Therapeutic exercise;Patient/family education;Cognitive remediation   PT Goals Acute Rehab PT Goals PT Goal Formulation: With patient Time For Goal Achievement: 02/24/13 Potential to Achieve Goals: Good Pt will Roll Supine to Right Side: with mod assist;with rail PT Goal: Rolling Supine to Right Side - Progress: Goal set today Pt will Roll Supine to Left Side: with mod assist;with rail PT Goal: Rolling Supine to Left Side - Progress: Goal set today Pt will go Supine/Side to Sit: with mod assist;with HOB not 0 degrees (comment degree);with rail PT Goal: Supine/Side to Sit - Progress: Goal set today Pt will Sit at Edge of Bed: with min assist;3-5 min;with unilateral upper extremity support PT Goal: Sit at Edge Of Bed - Progress: Goal set today Additional Goals Additional Goal #1: Pt will laterally scoot along EOB (in sitting) with +2 total pt=30% to prepare for sliding board transfers PT Goal: Additional Goal #1 - Progress: Goal set today  Visit Information  Last PT Received On: 02/10/13 Assistance Needed: +2    Subjective Data  Subjective: Those therapists (at [facility]) called my daughter and told her I wasn't cooperating Patient Stated Goal: to gain some strength   Prior Functioning  Home Living Lives With: Other (Comment) (from SNF) Available Help at Discharge: Skilled Nursing Facility Additional Comments: pt reports he lived at [facility] Prior Function Level of Independence: Needs assistance Needs  Assistance: Gait Gait Assistance: per daughter could walk with one person with RW (some days  only to [facility], other times out to back porch) Communication Communication: HOH    Cognition  Cognition Arousal/Alertness: Awake/alert Behavior During Therapy: Flat affect Overall Cognitive Status: No family/caregiver present to determine baseline cognitive functioning    Extremity/Trunk Assessment Right Upper Extremity Assessment RUE ROM/Strength/Tone: Deficits RUE ROM/Strength/Tone Deficits: AAROM shoulder flexion to 100; full elbow flexion; elbow extension -15; strength grossly 3 to 3+ Left Upper Extremity Assessment LUE ROM/Strength/Tone: Deficits LUE ROM/Strength/Tone Deficits: AAROM shoulder flexion to 100; full elbow flexion; elbow extension -15; strength grossly 3 to 3+ Right Lower Extremity Assessment RLE ROM/Strength/Tone: Deficits RLE ROM/Strength/Tone Deficits: AAROM hip to 90, knee to 120, ankle to neutral; strength 2+ Left Lower Extremity Assessment LLE ROM/Strength/Tone: Unable to fully assess (posterior splint post-op)   Balance    End of Session PT - End of Session Activity Tolerance: Patient limited by fatigue Patient left: in bed;with call bell/phone within reach;with bed alarm set;Other (comment) (HOB 35; RLE elevated on pillow for edema & heel protection) Nurse Communication: Mobility status;Need for lift equipment  GP     Aarianna Hoadley 02/10/2013, 10:54 AM Pager [phone]

## 2013-02-11 DIAGNOSIS — M869 Osteomyelitis, unspecified: Secondary | ICD-10-CM

## 2013-02-11 DIAGNOSIS — L97509 Non-pressure chronic ulcer of other part of unspecified foot with unspecified severity: Secondary | ICD-10-CM

## 2013-02-11 LAB — GLUCOSE, CAPILLARY
Glucose-Capillary: 101 mg/dL — ABNORMAL HIGH (ref 70–99)
Glucose-Capillary: 146 mg/dL — ABNORMAL HIGH (ref 70–99)

## 2013-02-11 LAB — COMPREHENSIVE METABOLIC PANEL
Albumin: 2.2 g/dL — ABNORMAL LOW (ref 3.5–5.2)
Alkaline Phosphatase: 86 U/L (ref 39–117)
BUN: 16 mg/dL (ref 6–23)
Calcium: 8.3 mg/dL — ABNORMAL LOW (ref 8.4–10.5)
GFR calc Af Amer: 50 mL/min — ABNORMAL LOW (ref >90)
Potassium: 4.7 mEq/L (ref 3.5–5.1)
Total Protein: 6.2 g/dL (ref 6.0–8.3)

## 2013-02-11 LAB — TSH: TSH: 4.239 u[IU]/mL (ref 0.350–4.500)

## 2013-02-11 LAB — HEMOGLOBIN A1C: Hgb A1c MFr Bld: 6.2 % — ABNORMAL HIGH (ref <5.7)

## 2013-02-11 NOTE — Progress Notes (Signed)
    [facility]  Date of Admission:  02/09/2013  Antibiotics: vanco/ceftaz  Subjective: No complaints  Objective: Temp:  [97.5 F (36.4 C)-99.1 F (37.3 C)] 97.5 F (36.4 C) (05/17 0613) Pulse Rate:  [61-69] 63 (05/17 0613) Resp:  [18-20] 18 (05/17 0613) BP: (116-139)/(43-57) 116/43 mmHg (05/17 0613) SpO2:  [96 %-100 %] 96 % (05/17 0613)  General: Awake, alert, nad Skin: no rashes Lungs: CTA B Cor: RRR without m/r/g   Lab Results Lab Results  Component Value Date   WBC 7.5 02/10/2013   HGB 9.0* 02/10/2013   HCT 28.7* 02/10/2013   MCV 89.1 02/10/2013   PLT 216 02/10/2013    Lab Results  Component Value Date   CREATININE 1.42* 02/11/2013   BUN 16 02/11/2013   NA 134* 02/11/2013   K 4.7 02/11/2013   CL 102 02/11/2013   CO2 21 02/11/2013    Lab Results  Component Value Date   ALT 7 02/11/2013   AST 14 02/11/2013   ALKPHOS 86 02/11/2013   BILITOT 0.1* 02/11/2013      Microbiology: Recent Results (from the past 240 hour(s))  SURGICAL PCR SCREEN     Status: None   Collection Time    02/07/13  3:58 PM      Result Value Range Status   MRSA, PCR NEGATIVE  NEGATIVE Final   Staphylococcus aureus NEGATIVE  NEGATIVE Final   Comment:            The Xpert SA Assay (FDA     approved for NASAL specimens     in patients over 16 years of age),     is one component of     a comprehensive surveillance     program.  Test performance has     been validated by [employer] for patients greater     than or equal to 27 year old.     It is not intended     to diagnose infection nor to     guide or monitor treatment.    Studies/Results: No results found.  Assessment/Plan: 1)  Osteomyelitis, diabetic foot ulcer - BKA 5/15.  Treat for 2 weeks with vanco, ceftaz through May 28th.  We will arrange follow up at the time of treatment completion.   Thanks  Staci Righter, MD [facility] [phone] pager     02/11/2013, 11:22 AM

## 2013-02-11 NOTE — Progress Notes (Signed)
Subjective: 2 Days Post-Op Procedure(s) (LRB): LEFT AMPUTATION BELOW KNEE (Left) Patient reports pain as none.  .   PT recs noted.  OT pending.  Objective: Vital signs in last 24 hours: Temp:  [97.5 F (36.4 C)-99.1 F (37.3 C)] 97.5 F (36.4 C) (05/17 1610) Pulse Rate:  [61-69] 63 (05/17 0613) Resp:  [18-20] 18 (05/17 0613) BP: (116-139)/(43-57) 116/43 mmHg (05/17 0613) SpO2:  [96 %-100 %] 96 % (05/17 0613)  Intake/Output from previous day: 05/16 0701 - 05/17 0700 In: 60 [P.O.:60] Out: 1750 [Urine:1750] Intake/Output this shift:     Recent Labs  02/09/13 1658 02/10/13 0620  HGB 10.3* 9.0*    Recent Labs  02/09/13 1658 02/10/13 0620  WBC 7.1 7.5  RBC 3.63* 3.22*  HCT 32.2* 28.7*  PLT 134* 216    Recent Labs  02/10/13 2000 02/11/13 0525  NA 136 134*  K 5.1 4.7  CL 105 102  CO2 21 21  BUN 15 16  CREATININE 1.35 1.42*  GLUCOSE 76 79  CALCIUM 8.6 8.3*   No results found for this basename: LABPT, INR,  in the last 72 hours  L LE dressed and dry.  Splint in place.  Assessment/Plan: 2 Days Post-Op Procedure(s) (LRB): LEFT AMPUTATION BELOW KNEE (Left) Up with therapy  OT eval pending.  Hopefully back to [facility] Monday.  Toni Arthurs 02/11/2013, 7:59 AM

## 2013-02-11 NOTE — Progress Notes (Signed)
[facility] PROGRESS NOTE  Jesse Chandler ZOX:[id number] DOB: 1924/04/10 DOA: 02/09/2013 PCP: [name], MD  Assessment/Plan: Diabetes: Overnight the patient's blood sugars appear to be stable and well control. We will continue the current regimen.  Hypertension: Blood pressure is currently very well-controlled. We'll continue the current regimen.   Hypothyroid: The patient is continued on Synthroid as per his home regimen. Awaiting TSH.  History of paroxysmal atrial fibrillation: Patient is continued on Cardizem and remains rate controlled  Left heel ulcer, osteomyelitis: The patient is status post left BKA. Dr. Ninetta Lights from the infectious disease service has been consulted for antibiotic recommendations and has suggested vancomycin with Ceptaz for 2 weeks.  Antibiotics:  HPI/Subjective: The patient is without any complaints.  Objective: Filed Vitals:   02/10/13 1349 02/10/13 2141 02/11/13 0613 02/11/13 1338  BP: 139/57 120/46 116/43 121/58  Pulse: 69 61 63 62  Temp: 98.7 F (37.1 C) 99.1 F (37.3 C) 97.5 F (36.4 C) 97.6 F (36.4 C)  TempSrc: Oral Oral Axillary Axillary  Resp: 20 18 18 18   Height:      SpO2: 100% 97% 96% 97%    Intake/Output Summary (Last 24 hours) at 02/11/13 1339 Last data filed at 02/11/13 1338  Gross per 24 hour  Intake     60 ml  Output   2700 ml  Net  -2640 ml   There were no vitals filed for this visit.  Exam:   General:  Patient's awake no apparent  Cardiovascular: Regular S1-S2  Respiratory: Normal respiratory effort, no crackles no wheezing  Abdomen: Soft positive bowel sounds  Musculoskeletal: Perfused no clubbing, left foot status post BKA   Data Reviewed: Basic Metabolic Panel:  Recent Labs Lab 02/07/13 1557 02/09/13 1658 02/10/13 0620 02/10/13 2000 02/11/13 0525  NA 137  --  136 136 134*  K 4.3  --  4.6 5.1 4.7  CL 103  --  105 105 102  CO2 23  --  21 21 21   GLUCOSE 143*  --  85 76 79  BUN 21  --  16 15  16   CREATININE 1.59* 1.21 1.33 1.35 1.42*  CALCIUM 8.9  --  8.3* 8.6 8.3*   Liver Function Tests:  Recent Labs Lab 02/11/13 0525  AST 14  ALT 7  ALKPHOS 86  BILITOT 0.1*  PROT 6.2  ALBUMIN 2.2*   No results found for this basename: LIPASE, AMYLASE,  in the last 168 hours No results found for this basename: AMMONIA,  in the last 168 hours CBC:  Recent Labs Lab 02/07/13 1557 02/09/13 1658 02/10/13 0620  WBC 8.4 7.1 7.5  HGB 9.6* 10.3* 9.0*  HCT 30.7* 32.2* 28.7*  MCV 89.0 88.7 89.1  PLT 222 134* 216   Cardiac Enzymes: No results found for this basename: CKTOTAL, CKMB, CKMBINDEX, TROPONINI,  in the last 168 hours BNP (last 3 results) No results found for this basename: PROBNP,  in the last 8760 hours CBG:  Recent Labs Lab 02/10/13 1716 02/10/13 1803 02/10/13 2228 02/11/13 0757 02/11/13 1248  GLUCAP 66* 70 86 71 101*    Recent Results (from the past 240 hour(s))  SURGICAL PCR SCREEN     Status: None   Collection Time    02/07/13  3:58 PM      Result Value Range Status   MRSA, PCR NEGATIVE  NEGATIVE Final   Staphylococcus aureus NEGATIVE  NEGATIVE Final   Comment:            The Xpert  SA Assay (FDA     approved for NASAL specimens     in patients over 73 years of age),     is one component of     a comprehensive surveillance     program.  Test performance has     been validated by [employer] for patients greater     than or equal to 63 year old.     It is not intended     to diagnose infection nor to     guide or monitor treatment.     Studies: No results found.  Scheduled Meds: . bicalutamide  50 mg Oral Daily  . calcium-vitamin D  1 tablet Oral BID  . cefTAZidime (FORTAZ)  IV  2 g Intravenous Q12H  . diltiazem  240 mg Oral Daily  . docusate sodium  100 mg Oral BID  . enoxaparin (LOVENOX) injection  40 mg Subcutaneous Q24H  . ferrous fumarate  1 tablet Oral BID  . fluconazole  150 mg Oral Daily  . folic acid  1 mg Oral Daily  .  furosemide  20 mg Oral Daily  . guaiFENesin  1,200 mg Oral BID  . insulin aspart  0-9 Units Subcutaneous TID WC  . levothyroxine  50 mcg Oral QAC breakfast  . polyethylene glycol  17 g Oral Daily  . senna  2 tablet Oral Daily  . vancomycin  1,500 mg Intravenous Q24H  . vancomycin  750 mg Intravenous Once   Continuous Infusions:   Active Problems:   Hypertension   Paroxysmal a-fib   Hypothyroidism   Diabetic foot ulcer    Time spent: 20 minutes    Lisa Blakeman K  [facility] Pager [phone]. If 7PM-7AM, please contact night-coverage at [email or web address], password [facility] 02/11/2013, 1:39 PM  LOS: 2 days

## 2013-02-12 LAB — GLUCOSE, CAPILLARY
Glucose-Capillary: 117 mg/dL — ABNORMAL HIGH (ref 70–99)
Glucose-Capillary: 110 mg/dL — ABNORMAL HIGH (ref 70–99)

## 2013-02-12 NOTE — Progress Notes (Signed)
[facility] PROGRESS NOTE  Jesse Chandler:[id number] DOB: 04-28-1947 DOA: 02/09/2013 PCP: [name], MD  Assessment/Plan: Diabetes: Overnight the patient's blood sugars appear to be stable and well control. We will continue the current regimen.   Hypertension: Blood pressure is currently very well-controlled. We'll continue the current regimen.   Hypothyroid: The patient is continued on Synthroid as per his home regimen. TSH normal.  History of paroxysmal atrial fibrillation: Patient is continued on Cardizem and remains rate controlled   Left heel ulcer, osteomyelitis: The patient is status post left BKA. Dr. Ninetta Lights from the infectious disease service has been consulted for antibiotic recommendations and has suggested vancomycin with Ceptaz for 2 weeks.  As the pt appears medically stable at this time, we will sign off for now. Please do not hesitate to call if there are any questions. Thank you again for the consult.    Code Status: Full Family Communication: Pt in Room (indicate person spoken with, relationship, and if by phone, the number) Disposition Plan: Per primary team     HPI/Subjective: Patient is without complaints  Objective: Filed Vitals:   02/11/13 0613 02/11/13 1338 02/11/13 2219 02/12/13 0615  BP: 116/43 121/58 115/41 133/47  Pulse: 63 62 56 58  Temp: 97.5 F (36.4 C) 97.6 F (36.4 C) 98.6 F (37 C) 97.3 F (36.3 C)  TempSrc: Axillary Axillary Oral Oral  Resp: 18 18 20 20   Height:      SpO2: 96% 97% 100% 100%    Intake/Output Summary (Last 24 hours) at 02/12/13 1316 Last data filed at 02/12/13 0615  Gross per 24 hour  Intake      0 ml  Output   2900 ml  Net  -2900 ml   There were no vitals filed for this visit.  Exam:   General:  Patient's awake in no apparent distress  Cardiovascular: Regular S1-S2  Respiratory: Normal respiratory effort, no crackles no wheezing  Abdomen: Soft positive bowel sounds  Musculoskeletal: No  clubbing or cyanosis, status post left BKA   Data Reviewed: Basic Metabolic Panel:  Recent Labs Lab 02/07/13 1557 02/09/13 1658 02/10/13 0620 02/10/13 2000 02/11/13 0525  NA 137  --  136 136 134*  K 4.3  --  4.6 5.1 4.7  CL 103  --  105 105 102  CO2 23  --  21 21 21   GLUCOSE 143*  --  85 76 79  BUN 21  --  16 15 16   CREATININE 1.59* 1.21 1.33 1.35 1.42*  CALCIUM 8.9  --  8.3* 8.6 8.3*   Liver Function Tests:  Recent Labs Lab 02/11/13 0525  AST 14  ALT 7  ALKPHOS 86  BILITOT 0.1*  PROT 6.2  ALBUMIN 2.2*   No results found for this basename: LIPASE, AMYLASE,  in the last 168 hours No results found for this basename: AMMONIA,  in the last 168 hours CBC:  Recent Labs Lab 02/07/13 1557 02/09/13 1658 02/10/13 0620  WBC 8.4 7.1 7.5  HGB 9.6* 10.3* 9.0*  HCT 30.7* 32.2* 28.7*  MCV 89.0 88.7 89.1  PLT 222 134* 216   Cardiac Enzymes: No results found for this basename: CKTOTAL, CKMB, CKMBINDEX, TROPONINI,  in the last 168 hours BNP (last 3 results) No results found for this basename: PROBNP,  in the last 8760 hours CBG:  Recent Labs Lab 02/11/13 1248 02/11/13 1707 02/11/13 2214 02/12/13 0739 02/12/13 1214  GLUCAP 101* 98 146* 117* 110*    Recent Results (from the past  240 hour(s))  SURGICAL PCR SCREEN     Status: None   Collection Time    02/07/13  3:58 PM      Result Value Range Status   MRSA, PCR NEGATIVE  NEGATIVE Final   Staphylococcus aureus NEGATIVE  NEGATIVE Final   Comment:            The Xpert SA Assay (FDA     approved for NASAL specimens     in patients over 48 years of age),     is one component of     a comprehensive surveillance     program.  Test performance has     been validated by [employer] for patients greater     than or equal to 50 year old.     It is not intended     to diagnose infection nor to     guide or monitor treatment.     Studies: No results found.  Scheduled Meds: . bicalutamide  50 mg Oral Daily  .  calcium-vitamin D  1 tablet Oral BID  . cefTAZidime (FORTAZ)  IV  2 g Intravenous Q12H  . diltiazem  240 mg Oral Daily  . docusate sodium  100 mg Oral BID  . enoxaparin (LOVENOX) injection  40 mg Subcutaneous Q24H  . ferrous fumarate  1 tablet Oral BID  . fluconazole  150 mg Oral Daily  . folic acid  1 mg Oral Daily  . furosemide  20 mg Oral Daily  . guaiFENesin  1,200 mg Oral BID  . insulin aspart  0-9 Units Subcutaneous TID WC  . levothyroxine  50 mcg Oral QAC breakfast  . polyethylene glycol  17 g Oral Daily  . senna  2 tablet Oral Daily  . vancomycin  1,500 mg Intravenous Q24H  . vancomycin  750 mg Intravenous Once   Continuous Infusions:   Active Problems:   Hypertension   Paroxysmal a-fib   Hypothyroidism   Diabetic foot ulcer    Time spent: 20 minutes    Aminah Zabawa K  [facility] Pager [phone]. If 7PM-7AM, please contact night-coverage at [email or web address], password [facility] 02/12/2013, 1:16 PM  LOS: 3 days

## 2013-02-12 NOTE — Progress Notes (Signed)
   Subjective: 3 Days Post-Op Procedure(s) (LRB): LEFT AMPUTATION BELOW KNEE (Left) Patient reports pain as mild.   Patient seen in rounds with Dr. Darrelyn Hillock. Patient is well, and has had no acute complaints or problems. He reports little to no pain in the left leg. No complaints of SOB or chest pain. No issues overnight.  Plan is to go Skilled nursing facility after hospital stay.  Objective: Vital signs in last 24 hours: Temp:  [97.3 F (36.3 C)-98.6 F (37 C)] 97.3 F (36.3 C) (05/18 0615) Pulse Rate:  [56-62] 58 (05/18 0615) Resp:  [18-20] 20 (05/18 0615) BP: (115-133)/(41-58) 133/47 mmHg (05/18 0615) SpO2:  [97 %-100 %] 100 % (05/18 0615)  Intake/Output from previous day:  Intake/Output Summary (Last 24 hours) at 02/12/13 1103 Last data filed at 02/12/13 0615  Gross per 24 hour  Intake      0 ml  Output   2900 ml  Net  -2900 ml    Intake/Output this shift:    Labs:  Recent Labs  02/09/13 1658 02/10/13 0620  HGB 10.3* 9.0*    Recent Labs  02/09/13 1658 02/10/13 0620  WBC 7.1 7.5  RBC 3.63* 3.22*  HCT 32.2* 28.7*  PLT 134* 216    Recent Labs  02/10/13 2000 02/11/13 0525  NA 136 134*  K 5.1 4.7  CL 105 102  CO2 21 21  BUN 15 16  CREATININE 1.35 1.42*  GLUCOSE 76 79  CALCIUM 8.6 8.3*    EXAM General - Patient is Alert and Oriented Extremity - Neurologically intact Incision: dressing C/D/I Compartment soft   Past Medical History  Diagnosis Date  . Prostate cancer   . Hypertension   . Hypothyroidism   . Psychosis   . CKD (chronic kidney disease) stage 3, GFR 30-59 ml/min     Baseline creatinine 1.7  . Posttraumatic stress disorder   . Dementia   . Recurrent right pleural effusion     H/O pleural catheter  . PAF (paroxysmal atrial fibrillation)   . Diabetes mellitus     no medication  . Anemia of chronic disease     s/p tx with erythropoietin and iron  . Catheter (urine) change required     Has indwelling foley  . MRSA  (methicillin resistant staph aureus) culture positive 06-21-2012      10-18-2012 in Epic [facility]  . Osteomyelitis   . Urinary obstruction     indwelling cath x 1 yr  . Urinary retention   . Pressure ulcer of heel left  . Dementia     w/o behavior disturbance  . Peripheral neuropathy   . GERD (gastroesophageal reflux disease)   . Refusal of blood transfusions as patient is Jehovah's Witness   . Shortness of breath     Assessment/Plan: 3 Days Post-Op Procedure(s) (LRB): LEFT AMPUTATION BELOW KNEE (Left) Active Problems:   Hypertension   Paroxysmal a-fib   Hypothyroidism   Diabetic foot ulcer  Estimated body mass index is 29.69 kg/(m^2) as calculated from the following:   Height as of this encounter: 6' 0.84" (1.85 m).   Weight as of 12/27/12: 101.606 kg (224 lb).  Continue current care. Plan for discharge to SNF tomorrow. Continue antibiotic coverage per ID. Follow up with Dr. Victorino Dike in the office as instructed.   Jesse Chandler LAUREN 02/12/2013, 11:03 AM

## 2013-02-12 NOTE — Progress Notes (Signed)
Attempt to insert PICC line.  Unable to thread centrally meeting some type of obstruction.  Recommend referral to IR for insertion.

## 2013-02-13 ENCOUNTER — Inpatient Hospital Stay (HOSPITAL_COMMUNITY): Payer: Medicare Other

## 2013-02-13 LAB — GLUCOSE, CAPILLARY
Glucose-Capillary: 79 mg/dL (ref 70–99)
Glucose-Capillary: 138 mg/dL — ABNORMAL HIGH (ref 70–99)

## 2013-02-13 MED ORDER — VANCOMYCIN HCL 10 G IV SOLR: 1500.0000 mg | INTRAVENOUS | Status: DC

## 2013-02-13 MED ORDER — HEPARIN SOD (PORK) LOCK FLUSH 100 UNIT/ML IV SOLN
250.0000 [IU] | INTRAVENOUS | Status: AC | PRN
  Administered 2013-02-13: 500 [IU]

## 2013-02-13 MED ORDER — SODIUM CHLORIDE 0.9 % IJ SOLN
10.0000 mL | INTRAMUSCULAR | Status: DC | PRN
  Administered 2013-02-13: 10 mL

## 2013-02-13 MED ORDER — VANCOMYCIN HCL 10 G IV SOLR
1250.0000 mg | INTRAVENOUS | Status: DC
  Administered 2013-02-13: 1250 mg via INTRAVENOUS
  Filled 2013-02-13: qty 1250

## 2013-02-13 MED ORDER — DEXTROSE 5 % IV SOLN: 2.0000 g | Freq: Two times a day (BID) | INTRAVENOUS | Status: DC

## 2013-02-13 MED ORDER — IOHEXOL 300 MG/ML  SOLN
50.0000 mL | Freq: Once | INTRAMUSCULAR | Status: AC | PRN
  Administered 2013-02-13: 5 mL via INTRAVENOUS

## 2013-02-13 MED ORDER — VANCOMYCIN HCL 10 G IV SOLR: 1250.0000 mg | INTRAVENOUS | Status: DC

## 2013-02-13 MED ORDER — SODIUM CHLORIDE 0.9 % IJ SOLN: 10.0000 mL | Freq: Two times a day (BID) | INTRAMUSCULAR | Status: DC

## 2013-02-13 MED ORDER — HYDROCODONE-ACETAMINOPHEN 5-325 MG PO TABS: 1.0000 | ORAL_TABLET | Freq: Four times a day (QID) | ORAL | Status: DC | PRN

## 2013-02-13 NOTE — Clinical Social Work Note (Signed)
Clinical Social Worker facilitated discharge by contacting family and facility, [facility]. CSW will complete discharge packet and will be placed with shadow chart. Patient will be transported via ambulance. CSW received [facility] authorization number: [id number]. CSW will sign off, as social work intervention is no longer needed.   Rozetta Nunnery MSW, [employer] [phone]

## 2013-02-13 NOTE — Progress Notes (Signed)
ANTIBIOTIC CONSULT NOTE - Follow-up  Pharmacy Consult for vancomycin + ceftaz Indication: osteo  No Known Allergies  Vital Signs: Temp: 97.9 F (36.6 C) (05/19 0621) Temp src: Oral (05/19 0621) BP: 123/41 mmHg (05/19 0621) Pulse Rate: 64 (05/19 0621) Intake/Output from previous day: 05/18 0701 - 05/19 0700 In: -  Out: 3000 [Urine:3000] Intake/Output from this shift:    Labs:  Recent Labs  02/10/13 2000 02/11/13 0525  CREATININE 1.35 1.42*   Assessment: 79 yo male s/p L BKA for calcaneal osteomyelitis/diabetic foot ulcer continues on IV vancomycin + ceftaz. A vancomycin trough drawn today is slightly above goal at 21.3. Will require treatment for 2 weeks through May 28.   Goal of Therapy:  Vancomycin trough 15-20  Plan:  1. Change vancomycin to 1250mg  IV Q24H - RN to give today prior to discharge - would recheck a trough at steady state as pt may accumulate 2. Continue ceftazidime 2mg  IV Q12H 3. F/u renal fxn, C&S, clinical status and trough at steady state  Lysle Pearl, PharmD, BCPS Pager # [phone] 02/13/2013 1:32 PM

## 2013-02-13 NOTE — Progress Notes (Addendum)
Pt discharged to [facility] via ambulance.

## 2013-02-13 NOTE — Progress Notes (Signed)
Patient to be discharged to [facility].  Report called to RN at [facility].  Ambulance called for transportation.  PICC line in place to left arm and flushed per IV team.  Family aware of transfer to [facility].  Will continue to monitor.

## 2013-02-13 NOTE — Progress Notes (Signed)
Occupational Therapy Evaluation Patient Details Name: Jesse Chandler MRN: [id number] DOB: 1924/02/01 Today's Date: 02/13/2013 Time: 4098-1191 OT Time Calculation (min): 33 min  OT Assessment / Plan / Recommendation Clinical Impression   This 79 y.o. Male admitted for Lt. BKA.  [place] was residing in [place] [facility] and plans to return there today.  He will benefit from continued OT at SNF to maximize safety and independence with BADLs for the below listed deficits.  Will defer further OT to SNF.    OT Assessment  All further OT needs can be met in the next venue of care    Follow Up Recommendations  SNF    Barriers to Discharge      Equipment Recommendations  None recommended by OT    Recommendations for Other Services    Frequency       Precautions / Restrictions Precautions Precautions: Fall Required Braces or Orthoses: Other Brace/Splint Other Brace/Splint: posterior splint to Lt BKA under postop dressing   Pertinent Vitals/Pain     ADL  Eating/Feeding: Minimal assistance;Simulated Where Assessed - Eating/Feeding: Bed level Grooming: Wash/dry hands;Wash/dry face;Minimal assistance Where Assessed - Grooming: Supine, head of bed up Upper Body Bathing: Maximal assistance Where Assessed - Upper Body Bathing: Supine, head of bed up Lower Body Bathing: +1 Total assistance Where Assessed - Lower Body Bathing: Supine, head of bed up;Supine, head of bed flat;Rolling right and/or left Upper Body Dressing: +1 Total assistance Where Assessed - Upper Body Dressing: Supine, head of bed up Lower Body Dressing: +1 Total assistance Where Assessed - Lower Body Dressing: Supine, head of bed up;Supine, head of bed flat;Rolling right and/or left Toilet Transfer: +1 Total assistance (unableq) Toileting - Clothing Manipulation and Hygiene: +1 Total assistance Where Assessed - Toileting Clothing Manipulation and Hygiene: Supine, head of bed flat;Rolling right and/or left Transfers/Ambulation Related to  ADLs: unable ADL Comments: Pt reports he is lying in stool.  Pt rolled to Lt. and was not in stool, however, dtr insistent that pt had stool on bottom.  Cleaned pt. peri-area, and pt proceeded to stool large amount.  Pt. assisted with peri care, and dtr insistent that pt be placed on bedpan despite pt indicating he did not have to have furhter BM.  Dressing changed on sacral wound during peri care and RN aware.    OT Diagnosis: Generalized weakness;Cognitive deficits  OT Problem List: Decreased strength;Decreased activity tolerance;Impaired balance (sitting and/or standing);Decreased coordination;Decreased cognition;Decreased knowledge of use of DME or AE;Decreased knowledge of precautions;Obesity;Impaired UE functional use OT Treatment Interventions:     OT Goals    Visit Information  Last OT Received On: 02/13/13 Assistance Needed: +2    Subjective Data  Subjective: "I'm lying in poop" (pt was not in stool) Patient Stated Goal: Pt unable to state   Prior Functioning     Home Living Lives With: Other (Comment) (from SNF) Available Help at Discharge: Skilled Nursing Facility Additional Comments: pt reports he lived at [facility] Prior Function Level of Independence: Needs assistance Needs Assistance: Gait Gait Assistance: per daughter could walk with one person with RW (some days only to [facility], other times out to back porch) Communication Communication: HOH         Vision/Perception     Cognition  Cognition Arousal/Alertness: Awake/alert Behavior During Therapy: Flat affect Overall Cognitive Status: Impaired/Different from baseline (per dtr) Area of Impairment: Memory;Attention;Awareness;Problem solving Current Attention Level: Sustained Memory: Decreased short-term memory Problem Solving: Slow processing;Decreased initiation;Difficulty sequencing;Requires verbal cues;Requires tactile cues General Comments:  Pt requires step by step cues for rolling. Pt not reliable  historian    Extremity/Trunk Assessment Right Upper Extremity Assessment RUE ROM/Strength/Tone: Deficits RUE ROM/Strength/Tone Deficits: AAROM shoulder flexion to 100; full elbow flexion; elbow extension -15; strength grossly 3 to 3+ RUE Coordination: Deficits RUE Coordination Deficits: difficulty manipulating small objects Left Upper Extremity Assessment LUE ROM/Strength/Tone: Deficits LUE ROM/Strength/Tone Deficits: AAROM shoulder flexion to 100; full elbow flexion; elbow extension -15; strength grossly 3 to 3+ LUE Coordination: Deficits LUE Coordination Deficits: difficulty manipulating small objects     Mobility Bed Mobility Bed Mobility: Rolling Right;Rolling Left Rolling Right: 1: +1 Total assist;With rail Rolling Left: 1: +1 Total assist;With rail Details for Bed Mobility Assistance: Pt very slow to process instruction.  Unable to self initiate activity.  Requires step by step cues/assist for all aspects     Exercise     Balance     End of Session OT - End of Session Activity Tolerance: Patient limited by fatigue Patient left: in bed;with call bell/phone within reach;with family/visitor present Nurse Communication: Need for lift equipment (wound on sacrum)  GO     Ulyses Panico, Ursula Alert M 02/13/2013, 12:56 PM

## 2013-02-13 NOTE — Discharge Summary (Signed)
Physician Discharge Summary  Patient ID: Jesse Chandler MRN: [id number] DOB/AGE: [birth year] 79 y.o.  Admit date: 02/09/2013 Discharge date: 02/13/2013  Admission Diagnoses:  HTN, diabetes, left calcaneus osteomyelitis, hypothyroidism  Discharge Diagnoses:  Active Problems:   Hypertension   Paroxysmal a-fib   Hypothyroidism   Diabetic foot ulcer s/p L BKA  Discharged Condition: stable  Hospital Course: Pt was admitted and taken to the OR for left BKA.  He tolerated the procedure well and remained on the intpatient ward for the duration of his stay.  He was seen by ID and the hosptialist service.  Vanc and ceftaz were ordered for a total length of 14 days post op (through 5/28).  He is discharged in stable condition.  Consults: ID  Significant Diagnostic Studies: labs: vanc trough  Treatments: surgery: left bka  Discharge Exam: Blood pressure 123/41, pulse 64, temperature 97.9 F (36.6 C), temperature source Oral, resp. rate 18, height 6' 0.83" (1.85 m), weight 106.595 kg (235 lb), SpO2 100.00%. wn wdmale in nad.  L LE splinted.  Disposition: to SNF  Discharge Orders   Future Appointments Provider Department Dept Phone   03/13/2013 11:00 AM Jesse Hiss, MD [facility] [phone]   Future Orders Complete By Expires     Call MD / Call 911  As directed     Comments:      If you experience chest pain or shortness of breath, CALL 911 and be transported to the hospital emergency room.  If you develope a fever above 101 F, pus (white drainage) or increased drainage or redness at the wound, or calf pain, call your surgeon's office.    Constipation Prevention  As directed     Comments:      Drink plenty of fluids.  Prune juice may be helpful.  You may use a stool softener, such as Colace (over the counter) 100 mg twice a day.  Use MiraLax (over the counter) for constipation as needed.    Diet Carb Modified  As directed     Increase  activity slowly as tolerated  As directed     Non weight bearing  As directed     Comments:      Left lower extremity.        Medication List    STOP taking these medications       amoxicillin-clavulanate 500-125 MG per tablet  Commonly known as:  AUGMENTIN     doxycycline 100 MG tablet  Commonly known as:  VIBRA-TABS      TAKE these medications       bicalutamide 50 MG tablet  Commonly known as:  CASODEX  Take 50 mg by mouth daily.     calcium-vitamin D 500-200 MG-UNIT per tablet  Commonly known as:  OSCAL WITH D  Take 1 tablet by mouth 2 (two) times daily.     dextrose 5 % SOLN 50 mL with cefTAZidime 2 G SOLR 2 g  Inject 2 g into the vein every 12 (twelve) hours.     diltiazem 240 MG 24 hr capsule  Commonly known as:  CARDIZEM CD  Take 240 mg by mouth daily.     docusate sodium 100 MG capsule  Commonly known as:  COLACE  Take 100 mg by mouth 2 (two) times daily.     ferrous fumarate 325 (106 FE) MG Tabs  Commonly known as:  HEMOCYTE - 106 mg FE  Take 1 tablet by mouth 2 (  two) times daily.     fluconazole 150 MG tablet  Commonly known as:  DIFLUCAN  Take 150 mg by mouth daily.     folic acid 1 MG tablet  Commonly known as:  FOLVITE  Take 1 mg by mouth daily.     furosemide 20 MG tablet  Commonly known as:  LASIX  Take 20 mg by mouth daily.     guaiFENesin 100 MG/5ML liquid  Commonly known as:  ROBITUSSIN  Take 300 mg by mouth every 4 (four) hours as needed for cough.     guaiFENesin 600 MG 12 hr tablet  Commonly known as:  MUCINEX  Take 1,200 mg by mouth 2 (two) times daily.     HYDROcodone-acetaminophen 5-325 MG per tablet  Commonly known as:  NORCO/VICODIN  Take 1-2 tablets by mouth every 6 (six) hours as needed for pain.     insulin regular 100 units/mL injection  Commonly known as:  NOVOLIN R,HUMULIN R  Inject 0-10 Units into the skin 2 (two) times daily before a meal. Per sliding scale:  0-149 0 units, 150-200 2 units, 201-250 4 units,  251-300 6 units, 301-350 8 units, 351-400 10 units, over 400 call md     ipratropium-albuterol 0.5-2.5 (3) MG/3ML Soln  Commonly known as:  DUONEB  Take 3 mLs by nebulization every 6 (six) hours as needed (dyspnea).     levothyroxine 50 MCG tablet  Commonly known as:  SYNTHROID, LEVOTHROID  Take 50 mcg by mouth daily.     omeprazole 40 MG capsule  Commonly known as:  PRILOSEC  Take 40 mg by mouth daily.     polyethylene glycol packet  Commonly known as:  MIRALAX / GLYCOLAX  Take 17 g by mouth daily. Dissolve in 4 oz of liquid and drink     senna 8.6 MG tablet  Commonly known as:  SENOKOT  Take 1 tablet by mouth daily.     sodium chloride 0.9 % SOLN 500 mL with vancomycin 10 G SOLR 1,500 mg  Inject 1,500 mg into the vein daily.     sodium phosphate enema  Commonly known as:  FLEET  Place 1 enema rectally daily as needed (constipation). follow package directions           Follow-up Information   Follow up with Jesse Chandler, Jesse Ruiz, MD. Schedule an appointment as soon as possible for a visit in 2 weeks.   Contact information:   [address] [place] [address] [phone]       Signed: Toni Chandler 02/13/2013, 7:29 AM

## 2013-02-23 ENCOUNTER — Ambulatory Visit: Payer: Medicare Other | Admitting: Infectious Disease

## 2013-02-27 ENCOUNTER — Ambulatory Visit: Payer: Medicare Other | Admitting: Infectious Disease

## 2013-03-01 ENCOUNTER — Encounter: Payer: Self-pay | Admitting: Infectious Disease

## 2013-03-01 ENCOUNTER — Ambulatory Visit (INDEPENDENT_AMBULATORY_CARE_PROVIDER_SITE_OTHER): Payer: Medicare Other | Admitting: Infectious Disease

## 2013-03-01 VITALS — BP 116/68 | HR 66 | Temp 98.2°F

## 2013-03-01 DIAGNOSIS — T8740 Infection of amputation stump, unspecified extremity: Secondary | ICD-10-CM

## 2013-03-01 DIAGNOSIS — T798XXA Other early complications of trauma, initial encounter: Secondary | ICD-10-CM

## 2013-03-01 DIAGNOSIS — L089 Local infection of the skin and subcutaneous tissue, unspecified: Secondary | ICD-10-CM | POA: Insufficient documentation

## 2013-03-01 DIAGNOSIS — M86672 Other chronic osteomyelitis, left ankle and foot: Secondary | ICD-10-CM

## 2013-03-01 DIAGNOSIS — M86679 Other chronic osteomyelitis, unspecified ankle and foot: Secondary | ICD-10-CM

## 2013-03-01 DIAGNOSIS — S88119A Complete traumatic amputation at level between knee and ankle, unspecified lower leg, initial encounter: Secondary | ICD-10-CM

## 2013-03-01 DIAGNOSIS — Z89512 Acquired absence of left leg below knee: Secondary | ICD-10-CM

## 2013-03-01 DIAGNOSIS — T148XXA Other injury of unspecified body region, initial encounter: Secondary | ICD-10-CM | POA: Insufficient documentation

## 2013-03-01 LAB — CBC WITH DIFFERENTIAL/PLATELET
Basophils Absolute: 0 10*3/uL (ref 0.0–0.1)
Eosinophils Relative: 8 % — ABNORMAL HIGH (ref 0–5)
HCT: 35 % — ABNORMAL LOW (ref 39.0–52.0)
Hemoglobin: 11.4 g/dL — ABNORMAL LOW (ref 13.0–17.0)
Lymphocytes Relative: 21 % (ref 12–46)
Lymphs Abs: 2 10*3/uL (ref 0.7–4.0)
MCV: 86 fL (ref 78.0–100.0)
Monocytes Absolute: 0.8 10*3/uL (ref 0.1–1.0)
Monocytes Relative: 8 % (ref 3–12)
Neutro Abs: 5.9 10*3/uL (ref 1.7–7.7)
RDW: 14.2 % (ref 11.5–15.5)
WBC: 9.5 10*3/uL (ref 4.0–10.5)

## 2013-03-01 LAB — BASIC METABOLIC PANEL WITH GFR
CO2: 25 mEq/L (ref 19–32)
Chloride: 104 mEq/L (ref 96–112)
Creat: 1.51 mg/dL — ABNORMAL HIGH (ref 0.50–1.35)
Potassium: 4.6 mEq/L (ref 3.5–5.3)

## 2013-03-01 LAB — SEDIMENTATION RATE: Sed Rate: 125 mm/hr — ABNORMAL HIGH (ref 0–16)

## 2013-03-01 MED ORDER — DOXYCYCLINE HYCLATE 100 MG PO TABS: 100.0000 mg | ORAL_TABLET | Freq: Two times a day (BID) | ORAL | Status: DC

## 2013-03-01 NOTE — Progress Notes (Signed)
Subjective:    Patient ID: Jesse Chandler, male    DOB: 12-Oct-1923, 79 y.o.   MRN: [id number]  HPI   79 year-old [place] American man with multiple medical problems including diabetes dementia strokes, chronic kidney disease who is bedbound. He has developed an ulcer on his left heel due to sliding in his bed indicating into a pump. This ulcer developed in July and the patient has been seen at a wound care in [place] and ultimately by Dr. Wiliam Ke here at the wound center here at [facility]. Local care and recent debridement by Dr. Wiliam Ke. Some cultures were obtained in the left heel blood no other superficial or deep cultures. These were taken on September 24 and yielded methicillin resistant staph aureus along with Acinetobacter Complex.  Susceptibilities of methicillin-resistant staph aureus showed it to be sensitive to Bactrim vancomycin with MIC of 11 ZYVOX,  rifampin and tetracycline. The Acinetobacter bacteria complex was sensitive to ampicillin sulbactam with MIC of less than or equal to 2 sensitive to imipenem with MIC of less than or equal to 0.25 it had intermediate sensitivity ceftriaxone at 16 and has sensitivities to ceftaz edema and MIC of 4 to cefepime with MIC of 2 to gentamicin with MIC of less than or equal to 1 and tobramycin less than equal to 1 was resistant to ciprofloxacin intermediate to levofloxacin and resistant to Bactrim .  We obtained MRI of the ankle which showed:  IMPRESSION:  Lateral calcaneal tuberosity osteomyelitis with ulceration and  sinus tract extending from scan to bone surface. No soft tissue  abscess.   I referred him to Dr. Victorino Dike from Orthopedics for consideration of curative BKA but the patients' preference was to keep his foot.  He had been  maintained chronic doxycyline and augmentin but failed this and ultimately underwent BKA on the left in May. Topically he was treated with vancomycin and ceftazidime  and apparently until yesterday when the PICC line  was removed.  Days and the patient's stump today and he has had a little bit of purulent material developing in the wound which I'll send a fluoroscopy of to his orthopedist Dr. Victorino Dike. Also cultured this area and I'm sending a home  Skilled nursing facility on some oral doxycycline. He has not had fevers chills nausea or malaise.  . Wound has been stable and examined today with a small area is black on one edge of the wound but otherwise has granulation tissue and tissue that bleeds easily.  View the patient's MRI findings with both himself and his daughter I reviewed the entire case and the nature of chronic osteomyelitis in diabetic patients including this patient specific case with the daughter the patient and the other daughter. Abscess again and this chronic osteomyelitis will not be cured by oral or IV antibiotics and that he requires a below the knee and dictation to effect cure. Continue suppressive antibiotics offer some chance to continue to preserve the foot although it concurs the risk of infection becoming uncontrolled and potentially entering his bloodstream and causing sepsis. For now we'll continue with current oral antibiotics but I've encouraged the patient to revisit the idea below the knee amputation with Dr. Victorino Dike  wE spent greater than 45 minutes with the patient including greater than 50% of time in face to face counsel of the patient and his daughter and in coordination of their care.  RN and CMA unpacked and packed his wound.    Review of Systems  Constitutional: Negative for  fever, fatigue and unexpected weight change.  Respiratory: Negative for cough, chest tightness, shortness of breath, wheezing and stridor.   Gastrointestinal: Negative for abdominal distention.  Genitourinary: Negative for dysuria, hematuria, flank pain and difficulty urinating.  Skin: Positive for wound. Negative for color change and rash.  Neurological: Negative for dizziness, tremors, weakness  and light-headedness.  Hematological: Negative for adenopathy. Does not bruise/bleed easily.  Psychiatric/Behavioral: Positive for confusion, sleep disturbance and decreased concentration. Negative for behavioral problems and agitation.       Objective:   Physical Exam  Constitutional: He is oriented to person, place, and time. No distress.  HENT:  Head: Normocephalic and atraumatic.  Eyes: Conjunctivae and EOM are normal.  Neck: Normal range of motion. Neck supple.  Cardiovascular: Normal rate and regular rhythm.   Pulmonary/Chest: Effort normal and breath sounds normal.  Abdominal: He exhibits no distension.  Musculoskeletal: He exhibits edema. He exhibits no tenderness.       Legs:      Feet:  Neurological: He is alert and oriented to person, place, and time.  Skin: Skin is warm and dry. He is not diaphoretic. No erythema. No pallor.  Psychiatric: He has a normal mood and affect. His behavior is normal. Judgment and thought content normal.          Assessment & Plan:  Osteomyelitis: sp curative BKA and 2 weeks Vanco and ceftaz  Exudative material at stump site mid suture: Sent for culture placed back on oral doxycycline and will alert Dr. Victorino Dike. Patient is to see him next Friday but he may need to be seen sooner.  MRSA: likely culprit original infection.  Acinetobacter: less likely true pathogen was isolated.  CKD: check chemistries today with esr, crp

## 2013-03-01 NOTE — Patient Instructions (Addendum)
WITH ONE OF MY PARTNERS, DR [employer] SAW ALSO IN HOSPITAL

## 2013-03-03 LAB — WOUND CULTURE
Gram Stain: NONE SEEN
Gram Stain: NONE SEEN

## 2013-03-13 ENCOUNTER — Ambulatory Visit
Admission: RE | Admit: 2013-03-13 | Discharge: 2013-03-13 | Disposition: A | Discharge status: Home / Self Care | Payer: Medicare Other | Source: Ambulatory Visit | Attending: Infectious Disease | Admitting: Infectious Disease

## 2013-03-13 ENCOUNTER — Ambulatory Visit (INDEPENDENT_AMBULATORY_CARE_PROVIDER_SITE_OTHER): Payer: Medicare Other | Admitting: Infectious Disease

## 2013-03-13 ENCOUNTER — Encounter: Payer: Self-pay | Admitting: Infectious Disease

## 2013-03-13 VITALS — BP 121/64 | HR 66 | Temp 98.3°F

## 2013-03-13 DIAGNOSIS — E119 Type 2 diabetes mellitus without complications: Secondary | ICD-10-CM

## 2013-03-13 DIAGNOSIS — M869 Osteomyelitis, unspecified: Secondary | ICD-10-CM

## 2013-03-13 DIAGNOSIS — S88119A Complete traumatic amputation at level between knee and ankle, unspecified lower leg, initial encounter: Secondary | ICD-10-CM

## 2013-03-13 DIAGNOSIS — L89899 Pressure ulcer of other site, unspecified stage: Secondary | ICD-10-CM

## 2013-03-13 DIAGNOSIS — Z89512 Acquired absence of left leg below knee: Secondary | ICD-10-CM

## 2013-03-13 DIAGNOSIS — L8991 Pressure ulcer of unspecified site, stage 1: Secondary | ICD-10-CM

## 2013-03-13 DIAGNOSIS — L97509 Non-pressure chronic ulcer of other part of unspecified foot with unspecified severity: Secondary | ICD-10-CM

## 2013-03-13 DIAGNOSIS — E11621 Type 2 diabetes mellitus with foot ulcer: Secondary | ICD-10-CM | POA: Insufficient documentation

## 2013-03-13 DIAGNOSIS — L8992 Pressure ulcer of unspecified site, stage 2: Secondary | ICD-10-CM

## 2013-03-13 DIAGNOSIS — L89891 Pressure ulcer of other site, stage 1: Secondary | ICD-10-CM

## 2013-03-13 DIAGNOSIS — E1169 Type 2 diabetes mellitus with other specified complication: Secondary | ICD-10-CM

## 2013-03-13 DIAGNOSIS — L89509 Pressure ulcer of unspecified ankle, unspecified stage: Secondary | ICD-10-CM

## 2013-03-13 DIAGNOSIS — L89512 Pressure ulcer of right ankle, stage 2: Secondary | ICD-10-CM

## 2013-03-13 DIAGNOSIS — E1165 Type 2 diabetes mellitus with hyperglycemia: Secondary | ICD-10-CM

## 2013-03-13 LAB — CBC WITH DIFFERENTIAL/PLATELET
Basophils Absolute: 0 10*3/uL (ref 0.0–0.1)
Eosinophils Absolute: 0.6 10*3/uL (ref 0.0–0.7)
Eosinophils Relative: 6 % — ABNORMAL HIGH (ref 0–5)
Lymphs Abs: 2.1 10*3/uL (ref 0.7–4.0)
MCH: 26.8 pg (ref 26.0–34.0)
Neutrophils Relative %: 64 % (ref 43–77)
Platelets: 217 10*3/uL (ref 150–400)
RBC: 4.07 MIL/uL — ABNORMAL LOW (ref 4.22–5.81)
RDW: 13.9 % (ref 11.5–15.5)
WBC: 9.6 10*3/uL (ref 4.0–10.5)

## 2013-03-13 LAB — URIC ACID: Uric Acid, Serum: 6 mg/dL (ref 4.0–7.8)

## 2013-03-13 LAB — C-REACTIVE PROTEIN: CRP: 3.4 mg/dL — ABNORMAL HIGH (ref <0.60)

## 2013-03-13 MED ORDER — DOXYCYCLINE HYCLATE 100 MG PO TABS: 100.0000 mg | ORAL_TABLET | Freq: Two times a day (BID) | ORAL | Status: DC

## 2013-03-13 NOTE — Progress Notes (Signed)
Subjective:    Patient ID: Jesse Chandler, male    DOB: [birth year], 79 y.o.   MRN: [id number]  HPI   79 year-old [place] American man with multiple medical problems including diabetes dementia strokes, chronic kidney disease who is bedbound. He has developed an ulcer on his left heel due to sliding in his bed indicating into a pump. This ulcer developed in July and the patient has been seen at a wound care in [place] and ultimately by Dr. Wiliam Ke here at the wound center here at [facility]. Local care and recent debridement by Dr. Wiliam Ke. Some cultures were obtained in the left heel blood no other superficial or deep cultures. These were taken on September 24 and yielded methicillin resistant staph aureus along with Acinetobacter Complex.  Susceptibilities of methicillin-resistant staph aureus showed it to be sensitive to Bactrim vancomycin with MIC of 11 ZYVOX,  rifampin and tetracycline. The Acinetobacter bacteria complex was sensitive to ampicillin sulbactam with MIC of less than or equal to 2 sensitive to imipenem with MIC of less than or equal to 0.25 it had intermediate sensitivity ceftriaxone at 16 and has sensitivities to ceftaz edema and MIC of 4 to cefepime with MIC of 2 to gentamicin with MIC of less than or equal to 1 and tobramycin less than equal to 1 was resistant to ciprofloxacin intermediate to levofloxacin and resistant to Bactrim .  After failing attempts at oral suppressive abx, he underwent left BKA but has had some purulence in part of the wound which I cultured--> NG and placed him on oral doxy. He has seen Dr. Victorino Dike who has removed sutures. Pts ESR was still VERY high when I last saw him at 125  Since last visit he has developed pressure ulcer on proximal knee due to cast, along with continued ulcer on right ankle.   ON exam of his foot on right he has new area with blackening toe (great toe) not esp tender but changed in color.  No fevers, chillls or malaise.   Review of  Systems  Constitutional: Negative for fever, fatigue and unexpected weight change.  Respiratory: Negative for cough, chest tightness, shortness of breath, wheezing and stridor.   Gastrointestinal: Negative for abdominal distention.  Genitourinary: Negative for dysuria, hematuria, flank pain and difficulty urinating.  Skin: Positive for wound. Negative for color change and rash.  Neurological: Negative for dizziness, tremors, weakness and light-headedness.  Hematological: Negative for adenopathy. Does not bruise/bleed easily.  Psychiatric/Behavioral: Positive for confusion, sleep disturbance and decreased concentration. Negative for behavioral problems and agitation.       Objective:   Physical Exam  Constitutional: He is oriented to person, place, and time. No distress.  HENT:  Head: Normocephalic and atraumatic.  Eyes: Conjunctivae and EOM are normal.  Neck: Normal range of motion. Neck supple.  Cardiovascular: Normal rate and regular rhythm.   Pulmonary/Chest: Effort normal and breath sounds normal.  Abdominal: He exhibits no distension.  Musculoskeletal: He exhibits edema. He exhibits no tenderness.       Legs: Neurological: He is alert and oriented to person, place, and time.  Skin: Skin is warm and dry. He is not diaphoretic. No erythema. No pallor.  Psychiatric: He has a normal mood and affect. His behavior is normal. Judgment and thought content normal.          Assessment & Plan:  Osteomyelitis: sp curative BKA and 2 weeks Vanco and ceftaz. He had Exudative material at stump site mid suture: Sent for culture --  NGrowth and placed back on oral doxycycline and pt following closely with Dr. Victorino Dike  --continue doxy for now, see below re opposite foot and ulcers, recheck ESR, CRP BMP c gfr and CBC  Blackened toe on the right side: may have osteo here as well:  --will check plain films and then likely MRI here  Mx pressure ulcers: needs more OPTIMAL wound care and  preventative means to stop him getting new ones  MRSA: likely culprit original infection.  Acinetobacter: less likely true pathogen was isolated.  CKD: check chemistries today with esr, crp

## 2013-03-14 LAB — BASIC METABOLIC PANEL WITH GFR
Calcium: 9.3 mg/dL (ref 8.4–10.5)
Creat: 1.67 mg/dL — ABNORMAL HIGH (ref 0.50–1.35)
GFR, Est African American: 42 mL/min — ABNORMAL LOW
Sodium: 141 mEq/L (ref 135–145)

## 2013-04-04 ENCOUNTER — Encounter: Payer: Self-pay | Admitting: Internal Medicine

## 2013-04-04 ENCOUNTER — Ambulatory Visit (INDEPENDENT_AMBULATORY_CARE_PROVIDER_SITE_OTHER): Payer: Medicare Other | Admitting: Internal Medicine

## 2013-04-04 VITALS — BP 146/68 | HR 70 | Temp 97.9°F

## 2013-04-04 DIAGNOSIS — M9080 Osteopathy in diseases classified elsewhere, unspecified site: Secondary | ICD-10-CM

## 2013-04-04 DIAGNOSIS — M869 Osteomyelitis, unspecified: Secondary | ICD-10-CM

## 2013-04-04 DIAGNOSIS — E1169 Type 2 diabetes mellitus with other specified complication: Secondary | ICD-10-CM

## 2013-04-04 LAB — BASIC METABOLIC PANEL
BUN: 35 mg/dL — ABNORMAL HIGH (ref 6–23)
Chloride: 101 mEq/L (ref 96–112)
Glucose, Bld: 139 mg/dL — ABNORMAL HIGH (ref 70–99)
Potassium: 4 mEq/L (ref 3.5–5.3)

## 2013-04-04 LAB — CBC WITH DIFFERENTIAL/PLATELET
Eosinophils Relative: 5 % (ref 0–5)
Hemoglobin: 10.1 g/dL — ABNORMAL LOW (ref 13.0–17.0)
Lymphocytes Relative: 21 % (ref 12–46)
Lymphs Abs: 1.9 10*3/uL (ref 0.7–4.0)
MCH: 26.4 pg (ref 26.0–34.0)
MCHC: 32.4 g/dL (ref 30.0–36.0)
Monocytes Absolute: 0.8 10*3/uL (ref 0.1–1.0)

## 2013-04-04 NOTE — Progress Notes (Signed)
[facility]  CLINIC NOTE  RFV: follow up for osteomyelitis Subjective:    Patient ID: Jesse Chandler, male    DOB: 20-May-1924, 79 y.o.   MRN: [id number]  HPI 79yo M hx of DM, vascular disease, has left foot diabetic ulcer and osteomyelitis s/p left BKA in Spring 2014 treated  with 2 wks of ceftaz and vanco as mop-up s/p amputation. CX negative but had hx of MRSA and MDRO Acinetobacter baumanii. He was last seen by Dr. Daiva Eves in [facility] June, who felt that inflammatory markers still quite elevated despite BKA. Thus placed on doxycycline (past cultures had MRSA). Returns to follow up. He has been seen by Dr. Victorino Dike who is referring him to wound care clinic to be seen by Dr. Kelly Splinter to help with wound healing, possibly hyperbaric treatme.t. By the patient's paperwork, it does not appear that he had been receiving doxycycline in the past 4 wks. The patient's wound appears to be larger than what is mentioned 4 wks ago. The patient is a poor historian and his daughter is describing the wound and showing cellphone pictures regarding the wound changes. He has shallow ulcer to extensor surface of thigh, unclear if it is due to abrasion. There is a small 3-4 cm shallow ulcer involving the incision site of stump. There is no warmth, erythema, or fluctuance to the stump. The wound bed of the shallow ulcers are covered by fibrinous exudate for which the nurisng home is using once a day santyl ointment on it for debridement. He is schedule to go to wound care on July 21st. He denies fevers or chills.  Daughter reports that the patient's PSA is elevated, urologist recommending bone scan for concern for metastatic disease  Current Outpatient Prescriptions on File Prior to Visit  Medication Sig Dispense Refill  . bicalutamide (CASODEX) 50 MG tablet Take 50 mg by mouth daily.       . calcium-vitamin D (OSCAL WITH D) 500-200 MG-UNIT per tablet Take 1 tablet by mouth 2 (two) times daily.      Marland Kitchen diltiazem (CARDIZEM CD) 240 MG 24 hr  capsule Take 240 mg by mouth daily.       Marland Kitchen docusate sodium (COLACE) 100 MG capsule Take 100 mg by mouth 2 (two) times daily.       Marland Kitchen doxycycline (VIBRA-TABS) 100 MG tablet Take 1 tablet (100 mg total) by mouth 2 (two) times daily.  60 tablet  1  . feeding supplement (PRO-STAT SUGAR FREE 64) LIQD Take 30 mLs by mouth 2 (two) times daily with a meal.      . ferrous fumarate (HEMOCYTE - 106 MG FE) 325 (106 FE) MG TABS Take 1 tablet by mouth 2 (two) times daily.      . fluconazole (DIFLUCAN) 150 MG tablet Take 150 mg by mouth daily.      . folic acid (FOLVITE) 1 MG tablet Take 1 mg by mouth daily.      . furosemide (LASIX) 20 MG tablet Take 20 mg by mouth daily.      Marland Kitchen guaiFENesin (MUCINEX) 600 MG 12 hr tablet Take 1,200 mg by mouth 2 (two) times daily.      Marland Kitchen guaiFENesin (ROBITUSSIN) 100 MG/5ML liquid Take 300 mg by mouth every 4 (four) hours as needed for cough.      Marland Kitchen HYDROcodone-acetaminophen (NORCO/VICODIN) 5-325 MG per tablet Take 1-2 tablets by mouth every 6 (six) hours as needed for pain.  30 tablet  0  . insulin regular (NOVOLIN  R,HUMULIN R) 100 units/mL injection Inject 0-10 Units into the skin 2 (two) times daily before a meal. Per sliding scale:  0-149 0 units, 150-200 2 units, 201-250 4 units, 251-300 6 units, 301-350 8 units, 351-400 10 units, over 400 call md      . ipratropium-albuterol (DUONEB) 0.5-2.5 (3) MG/3ML SOLN Take 3 mLs by nebulization every 6 (six) hours as needed (dyspnea).       Marland Kitchen levothyroxine (SYNTHROID, LEVOTHROID) 50 MCG tablet Take 50 mcg by mouth daily.      . Nutritional Supplements (BOOST DIABETIC) LIQD Take 2 oz by mouth 2 (two) times daily.      Marland Kitchen omeprazole (PRILOSEC) 40 MG capsule Take 40 mg by mouth daily.      . polyethylene glycol (MIRALAX / GLYCOLAX) packet Take 17 g by mouth daily. Dissolve in 4 oz of liquid and drink      . senna (SENOKOT) 8.6 MG tablet Take 1 tablet by mouth daily.      . sodium phosphate (FLEET) enema Place 1 enema rectally daily as  needed (constipation). follow package directions      . zinc sulfate 220 MG capsule Take 220 mg by mouth daily.       Current Facility-Administered Medications on File Prior to Visit  Medication Dose Route Frequency Provider Last Rate Last Dose  . gentamicin (GARAMYCIN) 160 mg in dextrose 5 % 50 mL IVPB  160 mg Intravenous Once Marcine Matar, MD       Active Ambulatory Problems    Diagnosis Date Noted  . Hypertension 12/09/2011  . H/O prostate cancer 12/09/2011  . Obstructive uropathy 12/09/2011  . UTI (urinary tract infection) 12/09/2011  . Paroxysmal a-fib 12/09/2011  . Pleural effusion, bilateral 12/09/2011  . Hypothyroidism 12/09/2011  . Acute kidney injury 12/10/2011  . Diabetic foot ulcer 07/13/2012  . Osteomyelitis of left foot 07/13/2012  . MRSA infection 07/13/2012  . MDR Acinetobacter baumannii infection 07/13/2012  . Post-traumatic wound infection 03/01/2013  . Diabetic ulcer of toe 03/13/2013   Resolved Ambulatory Problems    Diagnosis Date Noted  . No Resolved Ambulatory Problems   Past Medical History  Diagnosis Date  . Prostate cancer   . Psychosis   . CKD (chronic kidney disease) stage 3, GFR 30-59 ml/min   . Posttraumatic stress disorder   . Dementia   . Recurrent right pleural effusion   . PAF (paroxysmal atrial fibrillation)   . Diabetes mellitus   . Anemia of chronic disease   . Catheter (urine) change required   . MRSA (methicillin resistant staph aureus) culture positive 06-21-2012   . Osteomyelitis   . Urinary obstruction   . Urinary retention   . Pressure ulcer of heel left  . Dementia   . Peripheral neuropathy   . GERD (gastroesophageal reflux disease)   . Refusal of blood transfusions as patient is Jehovah's Witness   . Shortness of breath    Social hx: living in nursing home. No alcohol or smoking.  family history includes Hypertension in his mother.   Review of Systems No fever, chills, nightsweats, no pain to right foot nor left  BKA    Objective:   Physical Exam BP 146/68  Pulse 70  Temp(Src) 97.9 F (36.6 C) (Oral) Gen= elderly male in wheelchair a x o by 2 HEENT = ncat,perrla, eomi, no thrush Pulm= ctab, no w/c/r Gu= foley in place skinn = Right foot toe look good no evidence of any digits darkened as previously commented.  Right malleolus stage 1 decub, tip of great toe has callous no fluctuance. Left bka = shallow ulcer to extensor surface of thigh, unclear if it is due to abrasion. There is a small 3-4 cm shallow ulcer involving the incision site of stump.     Assessment & Plan:  Osteomyelitis = slowly healing. Will check cbc with diff, bmp, sed rate, and crp  represcribe him onto doxycycline 100mg  BId and have local wound care increased with santyl bid  See back in 6-8wk with Dr. Daiva Eves to decide if need to change antibiotic chronic suppression  Right foot pressure ulcer = continue with local wound care, no need to MRI at this point  Elevated inflammatory makers= may also partially due to other non-infectious causes such as elevated PSA, worsenign of prostate ca.

## 2013-04-13 ENCOUNTER — Ambulatory Visit: Payer: Medicare Other | Admitting: Internal Medicine

## 2013-04-17 ENCOUNTER — Encounter (HOSPITAL_BASED_OUTPATIENT_CLINIC_OR_DEPARTMENT_OTHER): Payer: Medicare Other | Attending: Plastic Surgery

## 2013-04-17 DIAGNOSIS — L97809 Non-pressure chronic ulcer of other part of unspecified lower leg with unspecified severity: Secondary | ICD-10-CM | POA: Insufficient documentation

## 2013-04-17 DIAGNOSIS — S88119A Complete traumatic amputation at level between knee and ankle, unspecified lower leg, initial encounter: Secondary | ICD-10-CM | POA: Insufficient documentation

## 2013-04-17 DIAGNOSIS — E119 Type 2 diabetes mellitus without complications: Secondary | ICD-10-CM | POA: Insufficient documentation

## 2013-04-17 DIAGNOSIS — I10 Essential (primary) hypertension: Secondary | ICD-10-CM | POA: Insufficient documentation

## 2013-04-18 NOTE — Progress Notes (Signed)
[facility]  NAME:  EWEL, LONA                ACCOUNT NO.:  [id number]  MEDICAL RECORD NO.:  [id number]      DATE OF BIRTH:  [birth year]  PHYSICIAN:  Wayland Denis, DO       VISIT DATE:  04/17/2013                                  OFFICE VISIT   SUBJECTIVE:  The patient is an 79 year old black male here with family for evaluation of the left below-knee amputation and ulceration, 3 of them.  He underwent an amputation for chronic wound and osteomyelitis by Dr. Victorino Dike approximately a month ago and was noted to have some skin breakdown at the incision site and more proximally.  He was started on doxycycline and is in a facility at the current time.  The area seems to be stable but not getting any better, so he was therefore referred for further evaluation.  PAST MEDICAL HISTORY:  Positive and extensive for, 1. Diabetes. 2. Thyroid disease. 3. Reflux. 4. Hypertension. 5. Pleural effusion. 6. Renal disease. 7. Atrial fibrillation. 8. Urinary tract infection. 9. Prostate cancer.  PAST SURGICAL HISTORY: 1. Left below-knee amputation. 2. Suprapubic catheter. 3. Gallbladder removal. 4. Prostate cancer surgery.  MEDICATIONS:  Senokot, doxycycline, calcium, Casodex, Cardizem, Norco, Lasix, folic acid, __________ and Synthroid.  ALLERGIES:  NO KNOWN DRUG ALLERGY.  SOCIAL HISTORY:  He is a former smoker, does drink, has a caregiver, is widowed, currently living in a [facility].  PHYSICAL EXAMINATION:  GENERAL:  On exam, he is alert, oriented, cooperative, pleasant. HEENT:  Pupils are equal.  Extraocular muscles are intact. NECK:  He does not have any cervical lymphadenopathy. RESPIRATORY:  His breathing is unlabored. No trouble breathing. HEART:  His heart rate is regular. ABDOMEN:  No abdominal pain. EXTREMITIES:  His upper extremity pulses are equal.  The ulcers are on the anterior aspect of the proximal portion of the lower extremity but distal to  the knee.  They are as follows; 3.9 x 2.5 x 0.1, the next one is 1.5 x 3.5 x 0.1, and the one that is most distal is 3.5 x 1.5 x 0.1. The two proximal wounds were debrided and those notes are noted in the chart.  RECOMMENDATION:  Recommend elevation, multivitamin, vitamin C, zinc, Dial soap cleaning, Santyl, Hydrogel daily and depending on how this looks in the next week or so, he may be a candidate for surgery and __________ placement, but we will also need to be sure his diabetes is managed and his protein intake is acceptable.     Wayland Denis, DO     CS/MEDQ  D:  04/17/2013  T:  04/18/2013  Job:  [id number]

## 2013-05-01 ENCOUNTER — Encounter (HOSPITAL_BASED_OUTPATIENT_CLINIC_OR_DEPARTMENT_OTHER): Payer: Medicare Other | Attending: Plastic Surgery

## 2013-05-01 DIAGNOSIS — S88119A Complete traumatic amputation at level between knee and ankle, unspecified lower leg, initial encounter: Secondary | ICD-10-CM | POA: Insufficient documentation

## 2013-05-01 DIAGNOSIS — L8992 Pressure ulcer of unspecified site, stage 2: Secondary | ICD-10-CM | POA: Insufficient documentation

## 2013-05-01 DIAGNOSIS — L89899 Pressure ulcer of other site, unspecified stage: Secondary | ICD-10-CM | POA: Insufficient documentation

## 2013-05-01 LAB — GLUCOSE, CAPILLARY: Glucose-Capillary: 202 mg/dL — ABNORMAL HIGH (ref 70–99)

## 2013-05-22 LAB — GLUCOSE, CAPILLARY: Glucose-Capillary: 218 mg/dL — ABNORMAL HIGH (ref 70–99)

## 2013-05-30 ENCOUNTER — Ambulatory Visit: Payer: Medicare Other | Admitting: Infectious Disease

## 2013-05-31 ENCOUNTER — Ambulatory Visit (INDEPENDENT_AMBULATORY_CARE_PROVIDER_SITE_OTHER): Payer: PRIVATE HEALTH INSURANCE | Admitting: Infectious Disease

## 2013-05-31 ENCOUNTER — Encounter: Payer: Self-pay | Admitting: Infectious Disease

## 2013-05-31 ENCOUNTER — Ambulatory Visit (HOSPITAL_COMMUNITY)
Admission: RE | Admit: 2013-05-31 | Discharge: 2013-05-31 | Disposition: A | Discharge status: Home / Self Care | Payer: Medicare Other | Source: Ambulatory Visit | Attending: Infectious Disease | Admitting: Infectious Disease

## 2013-05-31 VITALS — BP 130/71 | HR 70 | Temp 98.5°F | Wt 215.0 lb

## 2013-05-31 DIAGNOSIS — A4902 Methicillin resistant Staphylococcus aureus infection, unspecified site: Secondary | ICD-10-CM

## 2013-05-31 DIAGNOSIS — M869 Osteomyelitis, unspecified: Secondary | ICD-10-CM

## 2013-05-31 DIAGNOSIS — R609 Edema, unspecified: Secondary | ICD-10-CM

## 2013-05-31 DIAGNOSIS — R600 Localized edema: Secondary | ICD-10-CM

## 2013-05-31 DIAGNOSIS — B9689 Other specified bacterial agents as the cause of diseases classified elsewhere: Secondary | ICD-10-CM

## 2013-05-31 DIAGNOSIS — IMO0002 Reserved for concepts with insufficient information to code with codable children: Secondary | ICD-10-CM

## 2013-05-31 DIAGNOSIS — M79609 Pain in unspecified limb: Secondary | ICD-10-CM | POA: Insufficient documentation

## 2013-05-31 DIAGNOSIS — T879 Unspecified complications of amputation stump: Secondary | ICD-10-CM

## 2013-05-31 DIAGNOSIS — A499 Bacterial infection, unspecified: Secondary | ICD-10-CM

## 2013-05-31 DIAGNOSIS — M7989 Other specified soft tissue disorders: Secondary | ICD-10-CM | POA: Insufficient documentation

## 2013-05-31 DIAGNOSIS — L8990 Pressure ulcer of unspecified site, unspecified stage: Secondary | ICD-10-CM

## 2013-05-31 DIAGNOSIS — C61 Malignant neoplasm of prostate: Secondary | ICD-10-CM

## 2013-05-31 DIAGNOSIS — C800 Disseminated malignant neoplasm, unspecified: Secondary | ICD-10-CM

## 2013-05-31 LAB — BASIC METABOLIC PANEL WITH GFR
BUN: 51 mg/dL — ABNORMAL HIGH (ref 6–23)
CO2: 28 mEq/L (ref 19–32)
Chloride: 99 mEq/L (ref 96–112)
Creat: 1.39 mg/dL — ABNORMAL HIGH (ref 0.50–1.35)
Glucose, Bld: 122 mg/dL — ABNORMAL HIGH (ref 70–99)

## 2013-05-31 LAB — CBC WITH DIFFERENTIAL/PLATELET
Basophils Absolute: 0 K/uL (ref 0.0–0.1)
Basophils Relative: 0 % (ref 0–1)
Eosinophils Absolute: 0.3 K/uL (ref 0.0–0.7)
Eosinophils Relative: 3 % (ref 0–5)
HCT: 35 % — ABNORMAL LOW (ref 39.0–52.0)
Hemoglobin: 11.4 g/dL — ABNORMAL LOW (ref 13.0–17.0)
Lymphocytes Relative: 15 % (ref 12–46)
Lymphs Abs: 1.6 K/uL (ref 0.7–4.0)
MCH: 27 pg (ref 26.0–34.0)
MCHC: 32.6 g/dL (ref 30.0–36.0)
MCV: 82.9 fL (ref 78.0–100.0)
Monocytes Absolute: 1.3 K/uL — ABNORMAL HIGH (ref 0.1–1.0)
Monocytes Relative: 12 % (ref 3–12)
Neutro Abs: 7.8 K/uL — ABNORMAL HIGH (ref 1.7–7.7)
Neutrophils Relative %: 70 % (ref 43–77)
Platelets: 307 K/uL (ref 150–400)
RBC: 4.22 MIL/uL (ref 4.22–5.81)
RDW: 15 % (ref 11.5–15.5)
WBC: 11 K/uL — ABNORMAL HIGH (ref 4.0–10.5)

## 2013-05-31 LAB — SEDIMENTATION RATE: Sed Rate: 130 mm/h — ABNORMAL HIGH (ref 0–16)

## 2013-05-31 NOTE — Progress Notes (Signed)
Subjective:    Patient ID: Jesse Chandler, male    DOB: 06/27/1924, 79 y.o.   MRN: [id number]  HPI   79 year-old [place] American man with multiple medical problems including diabetes dementia strokes, chronic kidney disease who is bedbound. He has developed an ulcer on his left heel due to sliding in his bed indicating into a pump. This ulcer developed in July and the patient has been seen at a wound care in [place] and ultimately by Dr. Wiliam Ke here at the wound center here at [facility]. Local care and recent debridement by Dr. Wiliam Ke. Some cultures were obtained in the left heel blood no other superficial or deep cultures. These were taken on September 24 and yielded methicillin resistant staph aureus along with Acinetobacter Complex.  Susceptibilities of methicillin-resistant staph aureus showed it to be sensitive to Bactrim vancomycin with MIC of 11 ZYVOX,  rifampin and tetracycline. The Acinetobacter bacteria complex was sensitive to ampicillin sulbactam with MIC of less than or equal to 2 sensitive to imipenem with MIC of less than or equal to 0.25 it had intermediate sensitivity ceftriaxone at 16 and has sensitivities to ceftaz edema and MIC of 4 to cefepime with MIC of 2 to gentamicin with MIC of less than or equal to 1 and tobramycin less than equal to 1 was resistant to ciprofloxacin intermediate to levofloxacin and resistant to Bactrim .  After failing attempts at oral suppressive abx, he underwent left BKA but then had some purulence in part of the wound which I cultured--> NG and placed him on oral doxy. He has seen Dr. Victorino Dike who has removed sutures. Pts ESR was still VERY high in spring125  He then developed pressure ulcer on proximal knee due to cast, along with continued ulcer on right ankle.   Apparently ulceration worsened and he in fact had not been on doxycycline. My partner Dr. Drue Second saw the patient and placed him back on doxycycline along with Santyl dressings. Then the patient  has improvement in his left knee wound as well as the right ankle. He is no longer getting Santyl dressings but is on doxycycline. He has had some swelling in his right lower extremity which could be asymmetric but hard to tell since he has had a below the knee and dictation the other side. We'll get Dopplers to the right side to ensure he does not have a deep venous thrombosis. He currently is without fevers chills nausea or malaise.    Review of Systems  Constitutional: Negative for fever, fatigue and unexpected weight change.  Respiratory: Negative for cough, chest tightness, shortness of breath, wheezing and stridor.   Gastrointestinal: Negative for abdominal distention.  Genitourinary: Negative for dysuria, hematuria, flank pain and difficulty urinating.  Skin: Positive for wound. Negative for color change and rash.  Neurological: Negative for dizziness, tremors, weakness and light-headedness.  Hematological: Negative for adenopathy. Does not bruise/bleed easily.  Psychiatric/Behavioral: Positive for confusion, sleep disturbance and decreased concentration. Negative for behavioral problems and agitation.       Objective:   Physical Exam  Constitutional: He is oriented to person, place, and time. No distress.  HENT:  Head: Normocephalic and atraumatic.  Eyes: Conjunctivae and EOM are normal.  Neck: Normal range of motion. Neck supple.  Cardiovascular: Normal rate and regular rhythm.   Pulmonary/Chest: Effort normal and breath sounds normal.  Abdominal: He exhibits no distension.  Musculoskeletal: He exhibits edema. He exhibits no tenderness.       Legs: Neurological: He  is alert and oriented to person, place, and time.  Skin: Skin is warm and dry. He is not diaphoretic. No erythema. No pallor.  Psychiatric: He has a normal mood and affect. His behavior is normal. Judgment and thought content normal.          Assessment & Plan:  Osteomyelitis: sp curative BKA and 2 weeks  Vanco and ceftaz. He had Exudative material at stump site mid suture: Sent for culture --NGrowth and placed back on oral doxycycline and pt following closely with Dr. Victorino Dike. Doxycycline has been restarted we'll continue this and check a sedimentation rate and C-reactive protein today. We'll plan on seeing him in followup in about a month. I reordered a Santyl dressing.  --continue doxy for now, see below re opposite foot and ulcers, recheck ESR, CRP BMP c gfr and CBC  I spent greater than 45 minutes with the patient including greater than 50% of time in face to face counsel of the patient and in coordination of their care.   Right edema: will check dopplers there   Blackened toe on the right side at past appt: had consider MRI will re-examine at next appt  MRSA: likely culprit original infection.  Acinetobacter: less likely true pathogen was isolated.  CKD: check chemistries today with esr, crp  Prostate cancer: apparently has metastatic disease with mets to bone on PEt scan which could explain his elevated nonspecific inflammatory markers.

## 2013-05-31 NOTE — Progress Notes (Signed)
VASCULAR LAB PRELIMINARY  PRELIMINARY  PRELIMINARY  PRELIMINARY  Right lower extremity venous duplex completed.    Preliminary report:  Right:  No evidence of DVT, superficial thrombosis, or Baker's cyst.  Dell Briner, RVS 05/31/2013, 4:50 PM

## 2013-06-06 ENCOUNTER — Encounter: Payer: Self-pay | Admitting: Infectious Disease

## 2013-06-19 ENCOUNTER — Encounter (HOSPITAL_BASED_OUTPATIENT_CLINIC_OR_DEPARTMENT_OTHER): Payer: PRIVATE HEALTH INSURANCE | Attending: Plastic Surgery

## 2013-06-19 DIAGNOSIS — IMO0002 Reserved for concepts with insufficient information to code with codable children: Secondary | ICD-10-CM | POA: Insufficient documentation

## 2013-06-19 DIAGNOSIS — Z79899 Other long term (current) drug therapy: Secondary | ICD-10-CM | POA: Insufficient documentation

## 2013-06-19 DIAGNOSIS — I10 Essential (primary) hypertension: Secondary | ICD-10-CM | POA: Insufficient documentation

## 2013-06-19 DIAGNOSIS — Y835 Amputation of limb(s) as the cause of abnormal reaction of the patient, or of later complication, without mention of misadventure at the time of the procedure: Secondary | ICD-10-CM | POA: Insufficient documentation

## 2013-06-19 DIAGNOSIS — E119 Type 2 diabetes mellitus without complications: Secondary | ICD-10-CM | POA: Insufficient documentation

## 2013-06-19 DIAGNOSIS — E039 Hypothyroidism, unspecified: Secondary | ICD-10-CM | POA: Insufficient documentation

## 2013-06-19 DIAGNOSIS — T8789 Other complications of amputation stump: Secondary | ICD-10-CM | POA: Insufficient documentation

## 2013-06-20 NOTE — Progress Notes (Signed)
[facility]  NAME:  Jesse Chandler, Jesse Chandler                ACCOUNT NO.:  [id number]  MEDICAL RECORD NO.:  [id number]      DATE OF BIRTH:  [birth year]  PHYSICIAN:  Wayland Denis, DO       VISIT DATE:  06/19/2013                                  OFFICE VISIT   HISTORY OF PRESENT ILLNESS:  The patient is an 79 year old man here for followup on his left amputated below-knee amputation of the left leg with ulceration.  He actually has been doing very well with improvement in the healing, it is superficial at present.  The proximal one is 1.5 x 0.8 x 0.1, the distal one is 1.2 x 0.8 x 0.1.  He was using Santyl and Hydrogel on the area, and he is in a nursing facility.  PAST MEDICAL HISTORY:  Positive for: 1. Hypertension. 2. Uropathy. 3. Diabetes. 4. UTI. 5. Atrial fibrillation. 6. Pleural effusion. 7. Hypothyroidism. 8. Kidney injury.  PAST SURGICAL HISTORY:  He has had surgery left below-knee amputation, suprapubic catheter, cholecystectomy, and prostate cancer.  MEDICATIONS:  Senokot, doxycycline, calcium, Casodex, Cardizem, Norco, Lasix, folic acid, __________ and Synthroid.  ALLERGIES:  He does not have any allergies.  SOCIAL HISTORY:  He is living in a facility, no smoking.  REVIEW OF SYSTEMS:  Otherwise negative.  PHYSICAL EXAMINATION:  On exam, he is alert, oriented, and cooperative. He is pleasant.  He seems to be aware of what is happening.  His daughter takes care of most of the communication.  The wound is improving and its distal portion has healed.  We will continue with the Hydrogel and see him back in 3 weeks.     Wayland Denis, DO     CS/MEDQ  D:  06/19/2013  T:  06/20/2013  Job:  [id number]

## 2013-07-10 ENCOUNTER — Encounter (HOSPITAL_BASED_OUTPATIENT_CLINIC_OR_DEPARTMENT_OTHER): Payer: PRIVATE HEALTH INSURANCE | Attending: Plastic Surgery

## 2013-07-10 ENCOUNTER — Encounter: Payer: Self-pay | Admitting: Infectious Disease

## 2013-07-10 ENCOUNTER — Ambulatory Visit (INDEPENDENT_AMBULATORY_CARE_PROVIDER_SITE_OTHER): Payer: PRIVATE HEALTH INSURANCE | Admitting: Infectious Disease

## 2013-07-10 VITALS — BP 144/71 | HR 64 | Temp 97.7°F

## 2013-07-10 DIAGNOSIS — N184 Chronic kidney disease, stage 4 (severe): Secondary | ICD-10-CM

## 2013-07-10 DIAGNOSIS — A499 Bacterial infection, unspecified: Secondary | ICD-10-CM

## 2013-07-10 DIAGNOSIS — A4902 Methicillin resistant Staphylococcus aureus infection, unspecified site: Secondary | ICD-10-CM

## 2013-07-10 DIAGNOSIS — Y835 Amputation of limb(s) as the cause of abnormal reaction of the patient, or of later complication, without mention of misadventure at the time of the procedure: Secondary | ICD-10-CM | POA: Insufficient documentation

## 2013-07-10 DIAGNOSIS — C800 Disseminated malignant neoplasm, unspecified: Secondary | ICD-10-CM

## 2013-07-10 DIAGNOSIS — T879 Unspecified complications of amputation stump: Secondary | ICD-10-CM

## 2013-07-10 DIAGNOSIS — E1169 Type 2 diabetes mellitus with other specified complication: Secondary | ICD-10-CM

## 2013-07-10 DIAGNOSIS — E11622 Type 2 diabetes mellitus with other skin ulcer: Secondary | ICD-10-CM

## 2013-07-10 DIAGNOSIS — M869 Osteomyelitis, unspecified: Secondary | ICD-10-CM

## 2013-07-10 DIAGNOSIS — L89509 Pressure ulcer of unspecified ankle, unspecified stage: Secondary | ICD-10-CM | POA: Insufficient documentation

## 2013-07-10 DIAGNOSIS — IMO0002 Reserved for concepts with insufficient information to code with codable children: Secondary | ICD-10-CM

## 2013-07-10 DIAGNOSIS — C61 Malignant neoplasm of prostate: Secondary | ICD-10-CM

## 2013-07-10 DIAGNOSIS — N183 Chronic kidney disease, stage 3 unspecified: Secondary | ICD-10-CM

## 2013-07-10 DIAGNOSIS — L8992 Pressure ulcer of unspecified site, stage 2: Secondary | ICD-10-CM | POA: Insufficient documentation

## 2013-07-10 DIAGNOSIS — B9689 Other specified bacterial agents as the cause of diseases classified elsewhere: Secondary | ICD-10-CM

## 2013-07-10 DIAGNOSIS — L97309 Non-pressure chronic ulcer of unspecified ankle with unspecified severity: Secondary | ICD-10-CM

## 2013-07-10 DIAGNOSIS — T8789 Other complications of amputation stump: Secondary | ICD-10-CM | POA: Insufficient documentation

## 2013-07-10 LAB — SEDIMENTATION RATE: Sed Rate: 122 mm/hr — ABNORMAL HIGH (ref 0–16)

## 2013-07-10 LAB — C-REACTIVE PROTEIN: CRP: 2.2 mg/dL — ABNORMAL HIGH (ref <0.60)

## 2013-07-10 MED ORDER — DOXYCYCLINE HYCLATE 100 MG PO TABS: 100.0000 mg | ORAL_TABLET | Freq: Two times a day (BID) | ORAL | Status: DC

## 2013-07-10 NOTE — Progress Notes (Signed)
Subjective:    Patient ID: Jesse Chandler, male    DOB: 11-08-1923, 79 y.o.   MRN: [id number]  HPI   79 year-old [place] American man with multiple medical problems including diabetes dementia strokes, chronic kidney disease who is bedbound. He has developed an ulcer on his left heel due to sliding in his bed indicating into a pump. This ulcer developed in July and the patient has been seen at a wound care in [place] and ultimately by Dr. Wiliam Ke here at the wound center here at [facility]. Local care and recent debridement by Dr. Wiliam Ke. Some cultures were obtained in the left heel blood no other superficial or deep cultures. These were taken on September 24 and yielded methicillin resistant staph aureus along with Acinetobacter Complex.  Susceptibilities of methicillin-resistant staph aureus showed it to be sensitive to Bactrim vancomycin with MIC of 11 ZYVOX,  rifampin and tetracycline. The Acinetobacter bacteria complex was sensitive to ampicillin sulbactam with MIC of less than or equal to 2 sensitive to imipenem with MIC of less than or equal to 0.25 it had intermediate sensitivity ceftriaxone at 16 and has sensitivities to ceftaz edema and MIC of 4 to cefepime with MIC of 2 to gentamicin with MIC of less than or equal to 1 and tobramycin less than equal to 1 was resistant to ciprofloxacin intermediate to levofloxacin and resistant to Bactrim .  After failing attempts at oral suppressive abx, he underwent left BKA but then had some purulence in part of the wound which I cultured--> NG and placed him on oral doxy. He has seen Dr. Victorino Dike who has removed sutures. Pts ESR was still VERY high in spring125  He then developed pressure ulcer on proximal knee due to cast, along with continued ulcer on right ankle.   Apparently ulceration worsened and he in fact had not been on doxycycline. My partner Dr. Drue Second saw the patient and placed him back on doxycycline along with Santyl dressings.  I then saw him  in September. Then the patient had improvement in his left knee wound as well as the right ankle. He was  no longer getting Santyl dressings but is on doxycycline. He had had some swelling in his right lower extremity which could be asymmetric but hard to tell since he has had a below the knee and dictation the other side. Dopplers were negative for clot that day. Labs DID show persistently high esr, crp. His PSA apparently ahs continued to climb and he has had PET scan showing progression of prostate cancer disease.      Review of Systems  Constitutional: Negative for fever, fatigue and unexpected weight change.  Respiratory: Negative for cough, chest tightness, shortness of breath, wheezing and stridor.   Gastrointestinal: Negative for abdominal distention.  Genitourinary: Negative for dysuria, hematuria, flank pain and difficulty urinating.  Skin: Positive for wound. Negative for color change and rash.  Neurological: Negative for dizziness, tremors, weakness and light-headedness.  Hematological: Negative for adenopathy. Does not bruise/bleed easily.  Psychiatric/Behavioral: Positive for confusion, sleep disturbance and decreased concentration. Negative for behavioral problems and agitation.       Objective:   Physical Exam  Constitutional: He is oriented to person, place, and time. No distress.  HENT:  Head: Normocephalic and atraumatic.  Eyes: Conjunctivae and EOM are normal.  Neck: Normal range of motion. Neck supple.  Cardiovascular: Normal rate and regular rhythm.   Pulmonary/Chest: Effort normal and breath sounds normal.  Abdominal: He exhibits no distension.  Musculoskeletal: He exhibits edema. He exhibits no tenderness.       Legs: Neurological: He is alert and oriented to person, place, and time.  Skin: Skin is warm and dry. He is not diaphoretic. No erythema. No pallor.  Psychiatric: He has a normal mood and affect. His behavior is normal. Judgment and thought content  normal.          Assessment & Plan:  Osteomyelitis: sp curative BKA and 2 weeks Vanco and ceftaz. He had Exudative material at stump site mid suture: Sent for culture --NGrowth and placed back on oral doxycycline and pt following closely with Dr. Victorino Dike and wound care.    will continue doxy and also recheck  ESR, CRP  though his prostate ca an or problem in the other foot could "muddy waters" interpreting this   Blackened toe on the right side seems stable to improved  Ulcers right ankle: plain films this summer without osteo, continue to follow and consider repeat films and MRI if worsens, continue doxcycline  MRSA: likely culprit original infection.  Acinetobacter: less likely true pathogen was isolated.   Prostate cancer: apparently has metastatic disease with mets to bone on PEt scan which could explain his elevated nonspecific inflammatory markers.

## 2013-07-31 ENCOUNTER — Encounter (HOSPITAL_BASED_OUTPATIENT_CLINIC_OR_DEPARTMENT_OTHER): Payer: PRIVATE HEALTH INSURANCE | Attending: Plastic Surgery

## 2013-07-31 DIAGNOSIS — I739 Peripheral vascular disease, unspecified: Secondary | ICD-10-CM | POA: Insufficient documentation

## 2013-07-31 DIAGNOSIS — L8990 Pressure ulcer of unspecified site, unspecified stage: Secondary | ICD-10-CM | POA: Insufficient documentation

## 2013-07-31 DIAGNOSIS — L89899 Pressure ulcer of other site, unspecified stage: Secondary | ICD-10-CM | POA: Insufficient documentation

## 2013-08-21 LAB — GLUCOSE, CAPILLARY

## 2013-08-22 NOTE — Progress Notes (Signed)
[facility]  NAME:  BOSTEN, NEWSTROM                     ACCOUNT NO.:  MEDICAL RECORD NO.:  [id number]      DATE OF BIRTH:  1924-06-11  PHYSICIAN:  Wayland Denis, DO       VISIT DATE:  08/21/2013                                  OFFICE VISIT   The patient is an 79 year old gentleman who has undergone debridement in the past for ulcer secondary to pressure and peripheral vascular disease.  He is doing extremely well and healing up these areas very, very nicely.  This is very encouraging to see.  He is alert, oriented, cooperative, not in any acute distress.  He is very pleasant.  Pupils are equal.  Extraocular muscles are intact.  He does not have any trouble breathing.  The wounds are markedly decreased in size. Recommend continuing with the dressing changes local and collagen changing every other day, and we will see him back in 3-4 weeks.     Wayland Denis, DO     CS/MEDQ  D:  08/21/2013  T:  08/21/2013  Job:  [id number]

## 2013-09-11 ENCOUNTER — Encounter (HOSPITAL_BASED_OUTPATIENT_CLINIC_OR_DEPARTMENT_OTHER): Payer: PRIVATE HEALTH INSURANCE | Attending: Plastic Surgery

## 2013-09-11 ENCOUNTER — Ambulatory Visit: Payer: PRIVATE HEALTH INSURANCE | Admitting: Infectious Disease

## 2013-12-20 ENCOUNTER — Inpatient Hospital Stay (HOSPITAL_COMMUNITY): Payer: PRIVATE HEALTH INSURANCE

## 2013-12-20 ENCOUNTER — Encounter (HOSPITAL_BASED_OUTPATIENT_CLINIC_OR_DEPARTMENT_OTHER): Payer: PRIVATE HEALTH INSURANCE | Attending: General Surgery

## 2013-12-20 ENCOUNTER — Inpatient Hospital Stay (HOSPITAL_COMMUNITY)
Admission: EM | Admit: 2013-12-20 | Discharge: 2013-12-27 | DRG: 616 | Disposition: A | Discharge status: Skilled Nursing Facility | Payer: PRIVATE HEALTH INSURANCE | Attending: Internal Medicine | Admitting: Internal Medicine

## 2013-12-20 ENCOUNTER — Encounter (HOSPITAL_COMMUNITY): Payer: Self-pay | Admitting: Surgery

## 2013-12-20 DIAGNOSIS — IMO0002 Reserved for concepts with insufficient information to code with codable children: Secondary | ICD-10-CM

## 2013-12-20 DIAGNOSIS — Z87891 Personal history of nicotine dependence: Secondary | ICD-10-CM

## 2013-12-20 DIAGNOSIS — K219 Gastro-esophageal reflux disease without esophagitis: Secondary | ICD-10-CM | POA: Diagnosis present

## 2013-12-20 DIAGNOSIS — R339 Retention of urine, unspecified: Secondary | ICD-10-CM | POA: Diagnosis present

## 2013-12-20 DIAGNOSIS — E669 Obesity, unspecified: Secondary | ICD-10-CM | POA: Diagnosis present

## 2013-12-20 DIAGNOSIS — N179 Acute kidney failure, unspecified: Secondary | ICD-10-CM | POA: Diagnosis present

## 2013-12-20 DIAGNOSIS — D62 Acute posthemorrhagic anemia: Secondary | ICD-10-CM | POA: Diagnosis not present

## 2013-12-20 DIAGNOSIS — T148XXA Other injury of unspecified body region, initial encounter: Secondary | ICD-10-CM

## 2013-12-20 DIAGNOSIS — E039 Hypothyroidism, unspecified: Secondary | ICD-10-CM | POA: Diagnosis present

## 2013-12-20 DIAGNOSIS — F0390 Unspecified dementia without behavioral disturbance: Secondary | ICD-10-CM | POA: Diagnosis present

## 2013-12-20 DIAGNOSIS — Z89519 Acquired absence of unspecified leg below knee: Secondary | ICD-10-CM

## 2013-12-20 DIAGNOSIS — I480 Paroxysmal atrial fibrillation: Secondary | ICD-10-CM | POA: Diagnosis present

## 2013-12-20 DIAGNOSIS — R197 Diarrhea, unspecified: Secondary | ICD-10-CM | POA: Diagnosis present

## 2013-12-20 DIAGNOSIS — Z66 Do not resuscitate: Secondary | ICD-10-CM | POA: Diagnosis not present

## 2013-12-20 DIAGNOSIS — L97509 Non-pressure chronic ulcer of other part of unspecified foot with unspecified severity: Secondary | ICD-10-CM | POA: Diagnosis present

## 2013-12-20 DIAGNOSIS — I10 Essential (primary) hypertension: Secondary | ICD-10-CM

## 2013-12-20 DIAGNOSIS — A499 Bacterial infection, unspecified: Secondary | ICD-10-CM

## 2013-12-20 DIAGNOSIS — N189 Chronic kidney disease, unspecified: Secondary | ICD-10-CM | POA: Insufficient documentation

## 2013-12-20 DIAGNOSIS — C61 Malignant neoplasm of prostate: Secondary | ICD-10-CM

## 2013-12-20 DIAGNOSIS — L8994 Pressure ulcer of unspecified site, stage 4: Secondary | ICD-10-CM | POA: Insufficient documentation

## 2013-12-20 DIAGNOSIS — I447 Left bundle-branch block, unspecified: Secondary | ICD-10-CM | POA: Diagnosis present

## 2013-12-20 DIAGNOSIS — Z794 Long term (current) use of insulin: Secondary | ICD-10-CM

## 2013-12-20 DIAGNOSIS — L89109 Pressure ulcer of unspecified part of back, unspecified stage: Secondary | ICD-10-CM | POA: Diagnosis present

## 2013-12-20 DIAGNOSIS — Z7401 Bed confinement status: Secondary | ICD-10-CM

## 2013-12-20 DIAGNOSIS — Z79899 Other long term (current) drug therapy: Secondary | ICD-10-CM | POA: Insufficient documentation

## 2013-12-20 DIAGNOSIS — A4902 Methicillin resistant Staphylococcus aureus infection, unspecified site: Secondary | ICD-10-CM

## 2013-12-20 DIAGNOSIS — E11622 Type 2 diabetes mellitus with other skin ulcer: Secondary | ICD-10-CM | POA: Diagnosis present

## 2013-12-20 DIAGNOSIS — B9689 Other specified bacterial agents as the cause of diseases classified elsewhere: Secondary | ICD-10-CM

## 2013-12-20 DIAGNOSIS — L02419 Cutaneous abscess of limb, unspecified: Secondary | ICD-10-CM | POA: Diagnosis present

## 2013-12-20 DIAGNOSIS — Z531 Procedure and treatment not carried out because of patient's decision for reasons of belief and group pressure: Secondary | ICD-10-CM

## 2013-12-20 DIAGNOSIS — Z8546 Personal history of malignant neoplasm of prostate: Secondary | ICD-10-CM | POA: Insufficient documentation

## 2013-12-20 DIAGNOSIS — E1159 Type 2 diabetes mellitus with other circulatory complications: Secondary | ICD-10-CM | POA: Insufficient documentation

## 2013-12-20 DIAGNOSIS — E119 Type 2 diabetes mellitus without complications: Secondary | ICD-10-CM

## 2013-12-20 DIAGNOSIS — Z8614 Personal history of Methicillin resistant Staphylococcus aureus infection: Secondary | ICD-10-CM

## 2013-12-20 DIAGNOSIS — E875 Hyperkalemia: Secondary | ICD-10-CM | POA: Diagnosis not present

## 2013-12-20 DIAGNOSIS — I4891 Unspecified atrial fibrillation: Secondary | ICD-10-CM | POA: Diagnosis present

## 2013-12-20 DIAGNOSIS — D649 Anemia, unspecified: Secondary | ICD-10-CM

## 2013-12-20 DIAGNOSIS — N390 Urinary tract infection, site not specified: Secondary | ICD-10-CM

## 2013-12-20 DIAGNOSIS — M9080 Osteopathy in diseases classified elsewhere, unspecified site: Secondary | ICD-10-CM | POA: Diagnosis present

## 2013-12-20 DIAGNOSIS — J90 Pleural effusion, not elsewhere classified: Secondary | ICD-10-CM

## 2013-12-20 DIAGNOSIS — D638 Anemia in other chronic diseases classified elsewhere: Secondary | ICD-10-CM | POA: Diagnosis present

## 2013-12-20 DIAGNOSIS — N183 Chronic kidney disease, stage 3 unspecified: Secondary | ICD-10-CM | POA: Diagnosis present

## 2013-12-20 DIAGNOSIS — Z1635 Resistance to multiple antimicrobial drugs: Secondary | ICD-10-CM

## 2013-12-20 DIAGNOSIS — E43 Unspecified severe protein-calorie malnutrition: Secondary | ICD-10-CM | POA: Diagnosis present

## 2013-12-20 DIAGNOSIS — I129 Hypertensive chronic kidney disease with stage 1 through stage 4 chronic kidney disease, or unspecified chronic kidney disease: Secondary | ICD-10-CM | POA: Insufficient documentation

## 2013-12-20 DIAGNOSIS — Z8249 Family history of ischemic heart disease and other diseases of the circulatory system: Secondary | ICD-10-CM

## 2013-12-20 DIAGNOSIS — E1169 Type 2 diabetes mellitus with other specified complication: Principal | ICD-10-CM | POA: Diagnosis present

## 2013-12-20 DIAGNOSIS — L89609 Pressure ulcer of unspecified heel, unspecified stage: Secondary | ICD-10-CM

## 2013-12-20 DIAGNOSIS — L8990 Pressure ulcer of unspecified site, unspecified stage: Secondary | ICD-10-CM | POA: Diagnosis present

## 2013-12-20 DIAGNOSIS — L89899 Pressure ulcer of other site, unspecified stage: Secondary | ICD-10-CM | POA: Diagnosis present

## 2013-12-20 DIAGNOSIS — S88119A Complete traumatic amputation at level between knee and ankle, unspecified lower leg, initial encounter: Secondary | ICD-10-CM

## 2013-12-20 DIAGNOSIS — L03119 Cellulitis of unspecified part of limb: Secondary | ICD-10-CM

## 2013-12-20 DIAGNOSIS — I96 Gangrene, not elsewhere classified: Secondary | ICD-10-CM | POA: Insufficient documentation

## 2013-12-20 DIAGNOSIS — L8993 Pressure ulcer of unspecified site, stage 3: Secondary | ICD-10-CM | POA: Diagnosis present

## 2013-12-20 DIAGNOSIS — Z1624 Resistance to multiple antibiotics: Secondary | ICD-10-CM

## 2013-12-20 DIAGNOSIS — C801 Malignant (primary) neoplasm, unspecified: Secondary | ICD-10-CM | POA: Diagnosis present

## 2013-12-20 DIAGNOSIS — A498 Other bacterial infections of unspecified site: Secondary | ICD-10-CM

## 2013-12-20 DIAGNOSIS — E11621 Type 2 diabetes mellitus with foot ulcer: Secondary | ICD-10-CM

## 2013-12-20 DIAGNOSIS — R5381 Other malaise: Secondary | ICD-10-CM | POA: Diagnosis present

## 2013-12-20 DIAGNOSIS — M869 Osteomyelitis, unspecified: Secondary | ICD-10-CM | POA: Diagnosis present

## 2013-12-20 DIAGNOSIS — L089 Local infection of the skin and subcutaneous tissue, unspecified: Secondary | ICD-10-CM | POA: Diagnosis present

## 2013-12-20 DIAGNOSIS — L97309 Non-pressure chronic ulcer of unspecified ankle with unspecified severity: Secondary | ICD-10-CM | POA: Diagnosis present

## 2013-12-20 DIAGNOSIS — N139 Obstructive and reflux uropathy, unspecified: Secondary | ICD-10-CM

## 2013-12-20 LAB — URINALYSIS, ROUTINE W REFLEX MICROSCOPIC
Bilirubin Urine: NEGATIVE
Glucose, UA: NEGATIVE mg/dL
KETONES UR: NEGATIVE mg/dL
Nitrite: NEGATIVE
PH: 5 (ref 5.0–8.0)
PROTEIN: 30 mg/dL — AB
Specific Gravity, Urine: 1.015 (ref 1.005–1.030)
UROBILINOGEN UA: 0.2 mg/dL (ref 0.0–1.0)

## 2013-12-20 LAB — COMPREHENSIVE METABOLIC PANEL
ALBUMIN: 2.3 g/dL — AB (ref 3.5–5.2)
AST: 10 U/L (ref 0–37)
Alkaline Phosphatase: 115 U/L (ref 39–117)
BUN: 34 mg/dL — ABNORMAL HIGH (ref 6–23)
CALCIUM: 9.7 mg/dL (ref 8.4–10.5)
CO2: 21 mEq/L (ref 19–32)
Chloride: 105 mEq/L (ref 96–112)
Creatinine, Ser: 1.21 mg/dL (ref 0.50–1.35)
GFR calc non Af Amer: 52 mL/min — ABNORMAL LOW (ref >90)
GFR, EST AFRICAN AMERICAN: 60 mL/min — AB (ref >90)
GLUCOSE: 222 mg/dL — AB (ref 70–99)
Potassium: 4.9 mEq/L (ref 3.7–5.3)
SODIUM: 138 meq/L (ref 137–147)
TOTAL PROTEIN: 7 g/dL (ref 6.0–8.3)
Total Bilirubin: <0.2 mg/dL — ABNORMAL LOW (ref 0.3–1.2)

## 2013-12-20 LAB — CLOSTRIDIUM DIFFICILE BY PCR: Toxigenic C. Difficile by PCR: NEGATIVE

## 2013-12-20 LAB — URINE MICROSCOPIC-ADD ON

## 2013-12-20 LAB — CBC
HCT: 27.9 % — ABNORMAL LOW (ref 39.0–52.0)
Hemoglobin: 8.7 g/dL — ABNORMAL LOW (ref 13.0–17.0)
MCH: 28.5 pg (ref 26.0–34.0)
MCHC: 31.2 g/dL (ref 30.0–36.0)
MCV: 91.5 fL (ref 78.0–100.0)
PLATELETS: 287 10*3/uL (ref 150–400)
RBC: 3.05 MIL/uL — ABNORMAL LOW (ref 4.22–5.81)
RDW: 15.5 % (ref 11.5–15.5)
WBC: 17.1 10*3/uL — ABNORMAL HIGH (ref 4.0–10.5)

## 2013-12-20 LAB — I-STAT CG4 LACTIC ACID, ED: Lactic Acid, Venous: 1.66 mmol/L (ref 0.5–2.2)

## 2013-12-20 LAB — HEMOGLOBIN A1C
HEMOGLOBIN A1C: 8 % — AB (ref <5.7)
Mean Plasma Glucose: 183 mg/dL — ABNORMAL HIGH (ref <117)

## 2013-12-20 LAB — GLUCOSE, CAPILLARY
GLUCOSE-CAPILLARY: 153 mg/dL — AB (ref 70–99)
Glucose-Capillary: 129 mg/dL — ABNORMAL HIGH (ref 70–99)
Glucose-Capillary: 198 mg/dL — ABNORMAL HIGH (ref 70–99)

## 2013-12-20 MED ORDER — DILTIAZEM HCL ER COATED BEADS 240 MG PO CP24
240.0000 mg | ORAL_CAPSULE | Freq: Every day | ORAL | Status: DC
  Administered 2013-12-21 – 2013-12-27 (×4): 240 mg via ORAL
  Filled 2013-12-20 (×7): qty 1

## 2013-12-20 MED ORDER — GUAIFENESIN ER 600 MG PO TB12
1200.0000 mg | ORAL_TABLET | Freq: Two times a day (BID) | ORAL | Status: DC
  Administered 2013-12-20 – 2013-12-25 (×8): 1200 mg via ORAL
  Filled 2013-12-20 (×11): qty 2

## 2013-12-20 MED ORDER — SODIUM CHLORIDE 0.9 % IV SOLN
INTRAVENOUS | Status: AC
  Administered 2013-12-20: 18:00:00 via INTRAVENOUS

## 2013-12-20 MED ORDER — FOLIC ACID 1 MG PO TABS
1.0000 mg | ORAL_TABLET | Freq: Every day | ORAL | Status: DC
  Administered 2013-12-21 – 2013-12-27 (×6): 1 mg via ORAL
  Filled 2013-12-20 (×7): qty 1

## 2013-12-20 MED ORDER — LEVOTHYROXINE SODIUM 50 MCG PO TABS
50.0000 ug | ORAL_TABLET | Freq: Every day | ORAL | Status: DC
  Administered 2013-12-21 – 2013-12-27 (×6): 50 ug via ORAL
  Filled 2013-12-20 (×9): qty 1

## 2013-12-20 MED ORDER — SODIUM CHLORIDE 0.9 % IV SOLN
Freq: Once | INTRAVENOUS | Status: AC
  Administered 2013-12-20: 16:00:00 via INTRAVENOUS

## 2013-12-20 MED ORDER — PNEUMOCOCCAL VAC POLYVALENT 25 MCG/0.5ML IJ INJ
0.5000 mL | INJECTION | INTRAMUSCULAR | Status: DC
  Filled 2013-12-20 (×2): qty 0.5

## 2013-12-20 MED ORDER — HYDROCODONE-ACETAMINOPHEN 5-325 MG PO TABS
1.0000 | ORAL_TABLET | Freq: Four times a day (QID) | ORAL | Status: DC | PRN
  Administered 2013-12-23 – 2013-12-26 (×5): 1 via ORAL
  Filled 2013-12-20 (×6): qty 1

## 2013-12-20 MED ORDER — INSULIN GLARGINE 100 UNIT/ML ~~LOC~~ SOLN
12.0000 [IU] | Freq: Every day | SUBCUTANEOUS | Status: DC
  Administered 2013-12-20: 12 [IU] via SUBCUTANEOUS
  Filled 2013-12-20 (×2): qty 0.12

## 2013-12-20 MED ORDER — MORPHINE SULFATE 2 MG/ML IJ SOLN
2.0000 mg | INTRAMUSCULAR | Status: DC | PRN
  Administered 2013-12-22 – 2013-12-24 (×3): 2 mg via INTRAVENOUS
  Filled 2013-12-20 (×4): qty 1

## 2013-12-20 MED ORDER — MEROPENEM 1 G IV SOLR
1.0000 g | INTRAVENOUS | Status: AC
  Administered 2013-12-20: 1 g via INTRAVENOUS
  Filled 2013-12-20: qty 1

## 2013-12-20 MED ORDER — LOPERAMIDE HCL 2 MG PO CAPS
2.0000 mg | ORAL_CAPSULE | ORAL | Status: DC | PRN
  Filled 2013-12-20: qty 1

## 2013-12-20 MED ORDER — SODIUM CHLORIDE 0.9 % IV SOLN
1.0000 g | Freq: Three times a day (TID) | INTRAVENOUS | Status: DC
  Administered 2013-12-21 – 2013-12-25 (×13): 1 g via INTRAVENOUS
  Filled 2013-12-20 (×17): qty 1

## 2013-12-20 MED ORDER — CALCIUM CARBONATE-VITAMIN D 500-200 MG-UNIT PO TABS
1.0000 | ORAL_TABLET | Freq: Two times a day (BID) | ORAL | Status: DC
  Administered 2013-12-20 – 2013-12-27 (×13): 1 via ORAL
  Filled 2013-12-20 (×16): qty 1

## 2013-12-20 MED ORDER — BOOST DIABETIC PO LIQD: 2.0000 [oz_av] | Freq: Two times a day (BID) | ORAL | Status: DC

## 2013-12-20 MED ORDER — VITAMIN C 500 MG PO TABS
500.0000 mg | ORAL_TABLET | Freq: Every day | ORAL | Status: DC
  Administered 2013-12-20 – 2013-12-27 (×7): 500 mg via ORAL
  Filled 2013-12-20 (×8): qty 1

## 2013-12-20 MED ORDER — PANTOPRAZOLE SODIUM 40 MG PO TBEC
40.0000 mg | DELAYED_RELEASE_TABLET | Freq: Every day | ORAL | Status: DC
  Administered 2013-12-20 – 2013-12-27 (×7): 40 mg via ORAL
  Filled 2013-12-20 (×6): qty 1

## 2013-12-20 MED ORDER — FERROUS FUMARATE 325 (106 FE) MG PO TABS
1.0000 | ORAL_TABLET | Freq: Two times a day (BID) | ORAL | Status: DC
  Administered 2013-12-20 – 2013-12-21 (×2): 106 mg via ORAL
  Filled 2013-12-20 (×7): qty 1

## 2013-12-20 MED ORDER — MAGNESIUM HYDROXIDE 400 MG/5ML PO SUSP: 30.0000 mL | ORAL | Status: DC | PRN

## 2013-12-20 MED ORDER — IPRATROPIUM-ALBUTEROL 0.5-2.5 (3) MG/3ML IN SOLN
3.0000 mL | Freq: Four times a day (QID) | RESPIRATORY_TRACT | Status: DC | PRN
  Administered 2013-12-20 – 2013-12-24 (×2): 3 mL via RESPIRATORY_TRACT
  Filled 2013-12-20 (×2): qty 3

## 2013-12-20 MED ORDER — GUAIFENESIN-DM 100-10 MG/5ML PO SYRP
5.0000 mL | ORAL_SOLUTION | ORAL | Status: DC | PRN
  Administered 2013-12-21: 5 mL via ORAL
  Filled 2013-12-20 (×2): qty 5

## 2013-12-20 MED ORDER — FUROSEMIDE 40 MG PO TABS
60.0000 mg | ORAL_TABLET | Freq: Two times a day (BID) | ORAL | Status: DC
  Administered 2013-12-20 – 2013-12-22 (×4): 60 mg via ORAL
  Filled 2013-12-20 (×11): qty 1

## 2013-12-20 MED ORDER — PRO-STAT SUGAR FREE PO LIQD
30.0000 mL | Freq: Every day | ORAL | Status: DC
  Administered 2013-12-21 – 2013-12-27 (×6): 30 mL via ORAL
  Filled 2013-12-20 (×7): qty 30

## 2013-12-20 MED ORDER — SACCHAROMYCES BOULARDII 250 MG PO CAPS
250.0000 mg | ORAL_CAPSULE | Freq: Two times a day (BID) | ORAL | Status: DC
  Administered 2013-12-20 – 2013-12-27 (×11): 250 mg via ORAL
  Filled 2013-12-20 (×17): qty 1

## 2013-12-20 MED ORDER — B-COMPLEX PO CAPS: 1.0000 | ORAL_CAPSULE | Freq: Every day | ORAL | Status: DC

## 2013-12-20 MED ORDER — ENZALUTAMIDE 40 MG PO CAPS
160.0000 mg | ORAL_CAPSULE | Freq: Every day | ORAL | Status: DC
  Filled 2013-12-20: qty 4

## 2013-12-20 MED ORDER — INFLUENZA VAC SPLIT QUAD 0.5 ML IM SUSP
0.5000 mL | INTRAMUSCULAR | Status: DC
  Filled 2013-12-20 (×2): qty 0.5

## 2013-12-20 MED ORDER — POLYETHYLENE GLYCOL 3350 17 G PO PACK
17.0000 g | PACK | Freq: Every day | ORAL | Status: DC
  Administered 2013-12-21 – 2013-12-27 (×3): 17 g via ORAL
  Filled 2013-12-20 (×7): qty 1

## 2013-12-20 MED ORDER — INSULIN ASPART 100 UNIT/ML ~~LOC~~ SOLN
0.0000 [IU] | Freq: Three times a day (TID) | SUBCUTANEOUS | Status: DC
  Administered 2013-12-20 – 2013-12-23 (×2): 2 [IU] via SUBCUTANEOUS
  Administered 2013-12-23: 3 [IU] via SUBCUTANEOUS
  Administered 2013-12-24: 2 [IU] via SUBCUTANEOUS
  Administered 2013-12-24: 3 [IU] via SUBCUTANEOUS
  Administered 2013-12-27: 2 [IU] via SUBCUTANEOUS

## 2013-12-20 MED ORDER — ADULT MULTIVITAMIN W/MINERALS CH
1.0000 | ORAL_TABLET | Freq: Every day | ORAL | Status: DC
  Administered 2013-12-20 – 2013-12-27 (×7): 1 via ORAL
  Filled 2013-12-20 (×8): qty 1

## 2013-12-20 MED ORDER — ONDANSETRON HCL 4 MG/2ML IJ SOLN
4.0000 mg | Freq: Four times a day (QID) | INTRAMUSCULAR | Status: DC | PRN
Start: 2013-12-20 — End: 2013-12-27

## 2013-12-20 MED ORDER — HEPARIN SODIUM (PORCINE) 5000 UNIT/ML IJ SOLN
5000.0000 [IU] | Freq: Three times a day (TID) | INTRAMUSCULAR | Status: DC
  Administered 2013-12-20 – 2013-12-21 (×3): 5000 [IU] via SUBCUTANEOUS
  Filled 2013-12-20 (×5): qty 1

## 2013-12-20 MED ORDER — ONDANSETRON HCL 4 MG PO TABS: 4.0000 mg | ORAL_TABLET | Freq: Four times a day (QID) | ORAL | Status: DC | PRN

## 2013-12-20 MED ORDER — INSULIN ASPART 100 UNIT/ML ~~LOC~~ SOLN: 0.0000 [IU] | Freq: Every day | SUBCUTANEOUS | Status: DC

## 2013-12-20 MED ORDER — VANCOMYCIN HCL 10 G IV SOLR
2000.0000 mg | Freq: Once | INTRAVENOUS | Status: AC
  Administered 2013-12-20: 2000 mg via INTRAVENOUS
  Filled 2013-12-20 (×2): qty 2000

## 2013-12-20 MED ORDER — POTASSIUM CHLORIDE CRYS ER 20 MEQ PO TBCR
40.0000 meq | EXTENDED_RELEASE_TABLET | Freq: Two times a day (BID) | ORAL | Status: DC
  Administered 2013-12-20 – 2013-12-23 (×5): 40 meq via ORAL
  Filled 2013-12-20 (×10): qty 2

## 2013-12-20 MED ORDER — VANCOMYCIN HCL 10 G IV SOLR
1500.0000 mg | INTRAVENOUS | Status: DC
  Administered 2013-12-21 – 2013-12-24 (×4): 1500 mg via INTRAVENOUS
  Filled 2013-12-20 (×6): qty 1500

## 2013-12-20 MED ORDER — B COMPLEX-C PO TABS
1.0000 | ORAL_TABLET | Freq: Every day | ORAL | Status: DC
  Administered 2013-12-20 – 2013-12-27 (×7): 1 via ORAL
  Filled 2013-12-20 (×8): qty 1

## 2013-12-20 NOTE — ED Notes (Signed)
Per MD pt allowed to have PO fluids 

## 2013-12-20 NOTE — ED Notes (Signed)
Report given to [facility], rn

## 2013-12-20 NOTE — ED Provider Notes (Signed)
CSN: [id number]     Arrival date & time 12/20/13  1139 History   First MD Initiated Contact with Patient 12/20/13 1225     Chief Complaint  Patient presents with  . wound, pressure ulcers      (Consider location/radiation/quality/duration/timing/severity/associated sxs/prior Treatment) Patient is a 80 y.o. male presenting with general illness. The history is provided by the patient and a relative.  Illness Location:  R leg - concern for wound infection Quality:  Worsening infection Severity:  Moderate Onset quality:  Gradual Timing:  Constant Progression:  Worsening Chronicity:  Recurrent Context:  Bedbound - followed by wound center - on multiple antibiotics, followed by ID Relieved by:  Nothing Worsened by:  Nothing Ineffective treatments:  Augmentin, Doxy, Zyvox Associated symptoms: no cough, no fever and no shortness of breath     Past Medical History  Diagnosis Date  . Prostate cancer   . Hypertension   . Hypothyroidism   . Psychosis   . CKD (chronic kidney disease) stage 3, GFR 30-59 ml/min     Baseline creatinine 1.7  . Posttraumatic stress disorder   . Dementia   . Recurrent right pleural effusion     H/O pleural catheter  . PAF (paroxysmal atrial fibrillation)   . Diabetes mellitus     no medication  . Anemia of chronic disease     s/p tx with erythropoietin and iron  . Catheter (urine) change required     Has indwelling foley  . MRSA (methicillin resistant staph aureus) culture positive 06-21-2012      10-18-2012 in [facility]  . Osteomyelitis   . Urinary obstruction     indwelling cath x 1 yr  . Urinary retention   . Pressure ulcer of heel left  . Dementia     w/o behavior disturbance  . Peripheral neuropathy   . GERD (gastroesophageal reflux disease)   . Refusal of blood transfusions as patient is Jehovah's Witness   . Shortness of breath    Past Surgical History  Procedure Laterality Date  . Orchiectomy      bilateral  .  Subdural hematoma evacuation via craniotomy    . Cholecystectomy    . Insertion of suprapubic catheter N/A 01/05/2013    Procedure: INSERTION OF SUPRAPUBIC CATHETER;  Surgeon: Franchot Gallo, MD;  Location: [facility];  Service: Urology;  Laterality: N/A;  . Leg amputation below knee Left 02/09/2013    Dr Doran Durand  . Amputation Left 02/09/2013    Procedure: LEFT AMPUTATION BELOW KNEE;  Surgeon: Wylene Simmer, MD;  Location: [place];  Service: Orthopedics;  Laterality: Left;   Family History  Problem Relation Age of Onset  . Hypertension Mother    History  Substance Use Topics  . Smoking status: Former Research scientist (life sciences)  . Smokeless tobacco: Never Used  . Alcohol Use: No     Comment: occasional - twice a year    Review of Systems  Constitutional: Negative for fever.  Respiratory: Negative for cough and shortness of breath.   All other systems reviewed and are negative.      Allergies  Review of patient's allergies indicates no known allergies.  Home Medications   Current Outpatient Rx  Name  Route  Sig  Dispense  Refill  . bicalutamide (CASODEX) 50 MG tablet   Oral   Take 50 mg by mouth daily.          . calcium-vitamin D (OSCAL WITH D) 500-200 MG-UNIT per tablet  Oral   Take 1 tablet by mouth 2 (two) times daily.         Marland Kitchen diltiazem (CARDIZEM CD) 240 MG 24 hr capsule   Oral   Take 240 mg by mouth daily.          Marland Kitchen docusate sodium (COLACE) 100 MG capsule   Oral   Take 100 mg by mouth 2 (two) times daily.          Marland Kitchen doxycycline (VIBRA-TABS) 100 MG tablet   Oral   Take 1 tablet (100 mg total) by mouth 2 (two) times daily.   60 tablet   3   . feeding supplement (PRO-STAT SUGAR FREE 64) LIQD   Oral   Take 30 mLs by mouth 2 (two) times daily with a meal.         . ferrous fumarate (HEMOCYTE - 106 MG FE) 325 (106 FE) MG TABS   Oral   Take 1 tablet by mouth 2 (two) times daily.         . fluconazole (DIFLUCAN) 150 MG tablet   Oral   Take 150 mg by mouth daily.           . folic acid (FOLVITE) 1 MG tablet   Oral   Take 1 mg by mouth daily.         . furosemide (LASIX) 20 MG tablet   Oral   Take 20 mg by mouth daily.         Marland Kitchen guaiFENesin (MUCINEX) 600 MG 12 hr tablet   Oral   Take 1,200 mg by mouth 2 (two) times daily.         Marland Kitchen guaiFENesin (ROBITUSSIN) 100 MG/5ML liquid   Oral   Take 300 mg by mouth every 4 (four) hours as needed for cough.         Marland Kitchen HYDROcodone-acetaminophen (NORCO/VICODIN) 5-325 MG per tablet   Oral   Take 1-2 tablets by mouth every 6 (six) hours as needed for pain.   30 tablet   0   . insulin regular (NOVOLIN R,HUMULIN R) 100 units/mL injection   Subcutaneous   Inject 0-10 Units into the skin 2 (two) times daily before a meal. Per sliding scale:  0-149 0 units, 150-200 2 units, 201-250 4 units, 251-300 6 units, 301-350 8 units, 351-400 10 units, over 400 call md         . ipratropium-albuterol (DUONEB) 0.5-2.5 (3) MG/3ML SOLN   Nebulization   Take 3 mLs by nebulization every 6 (six) hours as needed (dyspnea).          Marland Kitchen levothyroxine (SYNTHROID, LEVOTHROID) 50 MCG tablet   Oral   Take 50 mcg by mouth daily.         . Nutritional Supplements (BOOST DIABETIC) LIQD   Oral   Take 2 oz by mouth 2 (two) times daily.         Marland Kitchen omeprazole (PRILOSEC) 40 MG capsule   Oral   Take 40 mg by mouth daily.         . polyethylene glycol (MIRALAX / GLYCOLAX) packet   Oral   Take 17 g by mouth daily. Dissolve in 4 oz of liquid and drink         . senna (SENOKOT) 8.6 MG tablet   Oral   Take 1 tablet by mouth daily.         . sodium phosphate (FLEET) enema   Rectal   Place 1 enema rectally daily  as needed (constipation). follow package directions         . zinc sulfate 220 MG capsule   Oral   Take 220 mg by mouth daily.          BP 138/82  Pulse 89  Temp(Src) 97.7 F (36.5 C) (Oral)  Resp 16  SpO2 99% Physical Exam  Nursing note and vitals reviewed. Constitutional: He is oriented to  person, place, and time. He appears well-developed and well-nourished. No distress.  HENT:  Head: Normocephalic and atraumatic.  Mouth/Throat: No oropharyngeal exudate.  Eyes: EOM are normal. Pupils are equal, round, and reactive to light.  Neck: Normal range of motion. Neck supple.  Cardiovascular: Normal rate and regular rhythm.  Exam reveals no friction rub.   No murmur heard. Pulmonary/Chest: Effort normal and breath sounds normal. No respiratory distress. He has no wheezes. He has no rales.  Abdominal: He exhibits no distension. There is no tenderness. There is no rebound.  Musculoskeletal: Normal range of motion. He exhibits no edema.       Legs: L BKA R leg wound with brown discharge at superior portion. No surrounding cellulitis.  Neurological: He is alert and oriented to person, place, and time.  Skin: No rash noted. He is not diaphoretic.    ED Course  Procedures (including critical care time) Labs Review Labs Reviewed  CULTURE, BLOOD (ROUTINE X 2)  CULTURE, BLOOD (ROUTINE X 2)  CBC  COMPREHENSIVE METABOLIC PANEL  I-STAT CG4 LACTIC ACID, ED   Imaging Review No results found.   EKG Interpretation None     Angiocath insertion Performed by: Osvaldo Shipper  Consent: Verbal consent obtained. Risks and benefits: risks, benefits and alternatives were discussed Time out: Immediately prior to procedure a "time out" was called to verify the correct patient, procedure, equipment, support staff and site/side marked as required.  Preparation: Patient was prepped and draped in the usual sterile fashion.  Vein Location: L AC  Yes Ultrasound Guided  Gauge: 20 gauge, success with 2nd attempt  Normal blood return and flush without difficulty Patient tolerance: Patient tolerated the procedure well with no immediate complications.    MDM   Final diagnoses:  S/P unilateral BKA (below knee amputation)  Diabetic ulcer of toe  Hypertension  Hypothyroidism    Infected pressure ulcer  MDR Acinetobacter baumannii infection  MRSA infection  Post-traumatic wound infection  Diabetic foot ulcer  Diabetic ulcer of ankle    8M with hx of MRSA infection, diabetes, chronic wounds on R leg/sacrum, L BKA presents with concern for wound infection. Sent from wound clinic after just having an infected tendon in his R leg removed (daughter provided "before" pictures) concern for a stage 4 infected wound. Currently on multiple antibiotics including doxy, augmentin, zyvox. Infectious disease is helping with med management. Patient had cultures showing multiple organisms including proteus and entercoccus. Here, relaxing comfortably. R leg wound with brown discharge superiorly. Sacral wound with beefy red granulation tissue, no signs of infection. With prior pictures showing infected tendon, will draw labs. Leukocytosis at 17k, previously 12k earlier this week. Blood and wound cultures obtained, started on Vanc. Admitted for concerns of worsening infection.    Osvaldo Shipper, MD 12/21/13 [phone]

## 2013-12-20 NOTE — ED Notes (Addendum)
Pt reports he was sent from wound center. Wound center doctor wants pt admitted. Note says "patient has stage IV pressure ulcer to R leg, also diabetic, prostate cancer".   Pt has extensive pressure ulcers. Left foot amputated, ulcer to right lower leg. Pressure ulcer to saccrum with vast skin break down. Pt also has diarrhea. Pt wears continuous oxygen.

## 2013-12-20 NOTE — H&P (Signed)
Patient Demographics  Jesse Chandler, is a 80 y.o. male  MRN: [id number]   DOB - August 08, 1924  Admit Date - 12/20/2013  Outpatient Primary MD for the patient is [place], MD   With History of -  Past Medical History  Diagnosis Date  . Prostate cancer   . Hypertension   . Hypothyroidism   . Psychosis   . CKD (chronic kidney disease) stage 3, GFR 30-59 ml/min     Baseline creatinine 1.3  . Posttraumatic stress disorder   . Dementia   . Recurrent right pleural effusion     H/O pleural catheter  . PAF (paroxysmal atrial fibrillation)   . Diabetes mellitus     no medication  . Anemia of chronic disease     s/p tx with erythropoietin and iron  . Catheter (urine) change required     Has indwelling foley  . MRSA (methicillin resistant staph aureus) culture positive 06-21-2012      10-18-2012 in [facility]  . Osteomyelitis   . Urinary obstruction     indwelling cath x 1 yr  . Urinary retention   . Pressure ulcer of heel left  . Dementia     w/o behavior disturbance  . Peripheral neuropathy   . GERD (gastroesophageal reflux disease)   . Refusal of blood transfusions as patient is Jehovah's Witness   . Shortness of breath       Past Surgical History  Procedure Laterality Date  . Orchiectomy      bilateral  . Subdural hematoma evacuation via craniotomy    . Cholecystectomy    . Insertion of suprapubic catheter N/A 01/05/2013    Procedure: INSERTION OF SUPRAPUBIC CATHETER;  Surgeon: Franchot Gallo, MD;  Location: [facility];  Service: Urology;  Laterality: N/A;  . Leg amputation below knee Left 02/09/2013    Dr Doran Durand  . Amputation Left 02/09/2013    Procedure: LEFT AMPUTATION BELOW KNEE;  Surgeon: Wylene Simmer, MD;  Location: [place];  Service: Orthopedics;  Laterality: Left;    in for    Chief Complaint  Patient presents with  . wound, pressure ulcers      HPI  Jesse Chandler  is a 80 y.o. male,  diabetes mellitus, bilateral diabetic foot ulcer, left BKA one year ago, infected right leg also for last several months currently on 3 different antibiotics, chronic kidney disease stage IV baseline creatinine 1.3, hypertension, bedbound state with stage II sacrodecubitus ulcer, chronic unit in retention requiring suprapubic catheter which was changed a few days ago, paroxysmal atrial fibrillation currently in sinus not on anticoagulation, early dementia, anemia of chronic disease, hypothyroidism, history of prostate cancer who lives in a nursing home near [place] and goes to the wound care clinic at [facility] and being followed for a right leg ulcer at the wound care clinic for several weeks comes in from wound care clinic as due to suspicion of worsening right leg  ulcer infection.   Apparently the right leg also has been draining some serosanguineous material the last few weeks, in the last week or so he has been started on 3 different oral antibiotics namely Augmentin-doxycycline-Zyvox, in the past he has grown MRSA and modified drug-resistant Acinetobacter, he has seen Dr. [facility] of infectious disease in the past. He was sent to the ER after in the wound clinic and infected tendon was excised for further care, in the ER he was found to have leukocytosis, right leg diabetic ulcer wound infection, stage II sacrodecubitus also, mild diarrhea. I was court-ordered but the patient.    Review of Systems    In addition to the HPI above,   No Fever-chills, No Headache, No changes with Vision or hearing, No problems swallowing food or Liquids, No Chest pain, Cough or Shortness of Breath, No Abdominal pain, No Nausea or Vommitting, Bowel movements are regular, No Blood in stool or Urine, No dysuria, No new skin rashes or bruises, No new joints pains-aches,  No new weakness,  tingling, numbness in any extremity, No recent weight gain or loss, No polyuria, polydypsia or polyphagia, No significant Mental Stressors.  A full 10 point Review of Systems was done, except as stated above, all other Review of Systems were negative.   Social History History  Substance Use Topics  . Smoking status: Former Research scientist (life sciences)  . Smokeless tobacco: Never Used  . Alcohol Use: No     Comment: occasional - twice a year      Family History Family History  Problem Relation Age of Onset  . Hypertension Mother      Prior to Admission medications   Medication Sig Start Date End Date Taking? Authorizing Provider  acetaminophen (TYLENOL) 325 MG tablet Take 650 mg by mouth every 4 (four) hours as needed (For pain).   Yes Historical Provider, MD  amoxicillin-clavulanate (AUGMENTIN) 875-125 MG per tablet Take 1 tablet by mouth 3 (three) times daily.   Yes Historical Provider, MD  B-Complex CAPS Take 1 capsule by mouth daily.   Yes Historical Provider, MD  calcium-vitamin D (OSCAL WITH D) 500-200 MG-UNIT per tablet Take 1 tablet by mouth 2 (two) times daily.   Yes Historical Provider, MD  diltiazem (CARDIZEM CD) 240 MG 24 hr capsule Take 240 mg by mouth daily.  06/28/12  Yes Historical Provider, MD  doxycycline (VIBRA-TABS) 100 MG tablet Take 100 mg by mouth 2 (two) times daily. For 10 days 12/17/13  Yes Historical Provider, MD  enzalutamide (XTANDI) 40 MG capsule Take 160 mg by mouth daily.   Yes Historical Provider, MD  feeding supplement (PRO-STAT SUGAR FREE 64) LIQD Take 30 mLs by mouth daily.    Yes Historical Provider, MD  ferrous fumarate (HEMOCYTE - 106 MG FE) 325 (106 FE) MG TABS Take 1 tablet by mouth 2 (two) times daily.   Yes Historical Provider, MD  folic acid (FOLVITE) 1 MG tablet Take 1 mg by mouth daily.   Yes Historical Provider, MD  furosemide (LASIX) 20 MG tablet Take 60 mg by mouth 2 (two) times daily.    Yes Historical Provider, MD  guaiFENesin (MUCINEX) 600 MG 12 hr  tablet Take 1,200 mg by mouth 2 (two) times daily.   Yes Historical Provider, MD  guaiFENesin (ROBITUSSIN) 100 MG/5ML liquid Take 300 mg by mouth every 4 (four) hours as needed for cough.   Yes Historical Provider, MD  HYDROcodone-acetaminophen (NORCO/VICODIN) 5-325 MG per tablet Take 1 tablet by mouth every 6 (  six) hours as needed for moderate pain.   Yes Historical Provider, MD  insulin regular (NOVOLIN R,HUMULIN R) 100 units/mL injection Inject 0-10 Units into the skin 2 (two) times daily before a meal. Per sliding scale:  0-149 0 units, 150-200 2 units, 201-250 4 units, 251-300 6 units, 301-350 8 units, 351-400 10 units, over 400 call md   Yes Historical Provider, MD  ipratropium-albuterol (DUONEB) 0.5-2.5 (3) MG/3ML SOLN Take 3 mLs by nebulization every 6 (six) hours as needed (dyspnea).    Yes Historical Provider, MD  levothyroxine (SYNTHROID, LEVOTHROID) 50 MCG tablet Take 50 mcg by mouth daily.   Yes Historical Provider, MD  linezolid (ZYVOX) 600 MG tablet Take 600 mg by mouth 2 (two) times daily. For 10 days 12/17/13  Yes Historical Provider, MD  loperamide (IMODIUM) 2 MG capsule Take 2 mg by mouth every 4 (four) hours as needed for diarrhea or loose stools.   Yes Historical Provider, MD  magnesium hydroxide (MILK OF MAGNESIA) 400 MG/5ML suspension Take 30 mLs by mouth every 4 (four) hours as needed for indigestion.   Yes Historical Provider, MD  Multiple Vitamin (MULTIVITAMIN WITH MINERALS) TABS tablet Take 1 tablet by mouth daily.   Yes Historical Provider, MD  Nutritional Supplements (BOOST DIABETIC) LIQD Take 2 oz by mouth 2 (two) times daily.   Yes Historical Provider, MD  omeprazole (PRILOSEC) 40 MG capsule Take 40 mg by mouth daily.   Yes Historical Provider, MD  polyethylene glycol (MIRALAX / GLYCOLAX) packet Take 17 g by mouth daily. Dissolve in 8 oz of liquid and drink   Yes Historical Provider, MD  potassium chloride SA (K-DUR,KLOR-CON) 20 MEQ tablet Take 40 mEq by mouth 2 (two) times  daily.   Yes Historical Provider, MD  senna (SENOKOT) 8.6 MG tablet Take 1 tablet by mouth daily.   Yes Historical Provider, MD  vitamin C (ASCORBIC ACID) 500 MG tablet Take 500 mg by mouth daily.   Yes Historical Provider, MD    No Known Allergies  Physical Exam  Vitals  Blood pressure 116/51, pulse 74, temperature 97.7 F (36.5 C), temperature source Oral, resp. rate 14, SpO2 98.00%.   1. General elderly AA male lying in bed in NAD,    2. Normal affect and insight, Not Suicidal or Homicidal, Awake Alert, Oriented X 3.  3. No F.N deficits, ALL C.Nerves Intact, Strength 5/5 all 4 extremities, Sensation intact all 4 extremities, Plantars down going.  4. Ears and Eyes appear Normal, Conjunctivae clear, PERRLA. Moist Oral Mucosa.  5. Supple Neck, No JVD, No cervical lymphadenopathy appriciated, No Carotid Bruits.  6. Symmetrical Chest wall movement, Good air movement bilaterally, CTAB.  7. RRR, No Gallops, Rubs or Murmurs, No Parasternal Heave.  8. Positive Bowel Sounds, Abdomen Soft, Non tender, No organomegaly appriciated,No rebound -guarding or rigidity. Suprapubic catheter in place. Stage II sacral decubitus .  9.  No Cyanosis, Normal Skin Turgor, No Skin Rash or Bruise. Left BKA, right leg on the lateral aspect of of the lateral malleolus there is a large stage IV ulcer with visible tendon and muscle tissue. Some serosanguineous drainage.  10. Good muscle tone,  joints appear normal , no effusions, Normal ROM.  11. No Palpable Lymph Nodes in Neck or Axillae     Data Review  CBC  Recent Labs Lab 12/20/13 1248  WBC 17.1*  HGB 8.7*  HCT 27.9*  PLT 287  MCV 91.5  MCH 28.5  MCHC 31.2  RDW 15.5   ------------------------------------------------------------------------------------------------------------------  Chemistries   Recent Labs Lab 12/20/13 1248  NA 138  K 4.9  CL 105  CO2 21  GLUCOSE 222*  BUN 34*  CREATININE 1.21  CALCIUM 9.7  AST 10  ALT  <5  ALKPHOS 115  BILITOT <0.2*   ------------------------------------------------------------------------------------------------------------------ CrCl is unknown because both a height and weight (above a minimum accepted value) are required for this calculation. ------------------------------------------------------------------------------------------------------------------ No results found for this basename: TSH, T4TOTAL, FREET3, T3FREE, THYROIDAB,  in the last 72 hours   Coagulation profile No results found for this basename: INR, PROTIME,  in the last 168 hours ------------------------------------------------------------------------------------------------------------------- No results found for this basename: DDIMER,  in the last 72 hours -------------------------------------------------------------------------------------------------------------------  Cardiac Enzymes No results found for this basename: CK, CKMB, TROPONINI, MYOGLOBIN,  in the last 168 hours ------------------------------------------------------------------------------------------------------------------ No components found with this basename: POCBNP,    ---------------------------------------------------------------------------------------------------------------  Urinalysis    Component Value Date/Time   COLORURINE AMBER* 12/09/2011 1833   APPEARANCEUR TURBID* 12/09/2011 1833   LABSPEC 1.019 12/09/2011 1833   PHURINE 5.5 12/09/2011 [place] 12/09/2011 1833   HGBUR LARGE* 12/09/2011 1833   BILIRUBINUR NEGATIVE 12/09/2011 1833   [place] 12/09/2011 1833   PROTEINUR 100* 12/09/2011 1833   UROBILINOGEN 0.2 12/09/2011 1833   NITRITE NEGATIVE 12/09/2011 1833   LEUKOCYTESUR LARGE* 12/09/2011 1833    ----------------------------------------------------------------------------------------------------------------  Imaging results:   No results found.    Ordered baseline EKG, UA and chest  x-ray.   Assessment & Plan    1. Right leg diabetic ulcer suspicious for osteomyelitis - blood cultures obtained, wound culture obtained in the ER, for now place on vancomycin and meropenem. I have consulted infectious disease physician Dr. Johnnye Sima. Patient follows with Dr. Doran Durand orthopedics on a close basis, I have discussed with patient and his daughter at that patient might end up losing his right leg and she is okay with an amputation if needed.    2. Stage II sacral decubitus ulcer. Obtain wound care.    3. Mild diarrhea. Likely due to being on 3 different antibiotics including Augmentin, will place on probiotic and rule out C. Difficile.    4. Diabetes mellitus type 2.  In poor control, check A1c, place on low-dose Lantus along with sliding scale insulin.    5. History of paroxysmal atrial fibrillation. Currently in sinus, will obtain baseline EKG for records and monitor clinically. Not on anticoagulation.   6. Hypothyroidism continue home Synthroid.    7. CKD stage III. Currently appears to be at baseline from 1.3.    8. History of prostate cancer-unit attention. Status post suprapubic catheter, catheter was changed today to go. Will obtain baseline you and monitor.     DVT Prophylaxis Heparin    AM Labs Ordered, also please review Full Orders  Family Communication: Admission, patients condition and plan of care including tests being ordered have been discussed with the patient and daughter who indicate understanding and agree with the plan and Code Status.  Code Status Full  Likely DC to  SNF  Condition GUARDED     Time spent in minutes : 35    SINGH,PRASHANT K M.D on 12/20/2013 at 3:25 PM  Between 7am to 7pm - Pager - [phone]  After 7pm go to [email or web address] - password [email or web address]  And look for the night coverage person covering me after hours  [facility] Office  [phone]

## 2013-12-20 NOTE — Progress Notes (Signed)
ANTIBIOTIC CONSULT NOTE - INITIAL  Pharmacy Consult for Vancomycin, meropenem Indication: Diabetic foot ulcer   No Known Allergies  Patient Measurements: Height: 6\' 1"  (185.4 cm) Weight: 223 lb 5.2 oz (101.3 kg) IBW/kg (Calculated) : 79.9 Last documented weight 97.5 kg (05/31/13).  Updated ht/wt ordered.  Vital Signs: Temp: 97.6 F (36.4 C) (03/25 1633) Temp src: Oral (03/25 1633) BP: 119/47 mmHg (03/25 1633) Pulse Rate: 80 (03/25 1633)  Labs:  Recent Labs  12/20/13 1248  WBC 17.1*  HGB 8.7*  PLT 287  CREATININE 1.21   Estimated Creatinine Clearance: 52.8 ml/min (by C-G formula based on Cr of 1.21).  Microbiology: No results found for this or any previous visit (from the past 720 hour(s)).  Medical History: Past Medical History  Diagnosis Date  . Prostate cancer   . Hypertension   . Hypothyroidism   . Psychosis   . CKD (chronic kidney disease) stage 3, GFR 30-59 ml/min     Baseline creatinine 1.7  . Posttraumatic stress disorder   . Dementia   . Recurrent right pleural effusion     H/O pleural catheter  . PAF (paroxysmal atrial fibrillation)   . Diabetes mellitus     no medication  . Anemia of chronic disease     s/p tx with erythropoietin and iron  . Catheter (urine) change required     Has indwelling foley  . MRSA (methicillin resistant staph aureus) culture positive 06-21-2012      10-18-2012 in [facility]  . Osteomyelitis   . Urinary obstruction     indwelling cath x 1 yr  . Urinary retention   . Pressure ulcer of heel left  . Dementia     w/o behavior disturbance  . Peripheral neuropathy   . GERD (gastroesophageal reflux disease)   . Refusal of blood transfusions as patient is Jehovah's Witness   . Shortness of breath     Medications:  Anti-infectives   Start     Dose/Rate Route Frequency Ordered Stop   12/20/13 1715  meropenem (MERREM) 1 g in sodium chloride 0.9 % 100 mL IVPB     1 g 200 mL/hr over 30 Minutes Intravenous  NOW 12/20/13 1711 03/Jesse/15 1715   12/20/13 1445  vancomycin (VANCOCIN) 2,000 mg in sodium chloride 0.9 % 500 mL IVPB     2,000 mg 250 mL/hr over 120 Minutes Intravenous  Once 12/20/13 1427       Assessment: Jesse Chandler admitted 3/25 with RIGHT leg diabetic ulcer wound infection, stage II sacrodecubitus ulcer.  PMH signifianct for CKD, bilateral diabetic foot ulcers, left BKA, infected right leg ulcer for several months and recently started on oral antibiotics.  Pharmacy is consulted to dose vancomycin and meropenem.  Tmax: 97.7  WBCs: 17.1  Renal: SCr 1.21, CrCl ~ 53 m/min CG (~ 43 ml/min Normalized)  Blood, urine, wound cultures pending.  Cdiff PCR pending.  Goal of Therapy:  Vancomycin trough level 15-20 mcg/ml Appropriate abx dosing, eradication of infection.  Plan:   Meropenem 1g IV q8h  Vancomycin 2g IV once, then 1500mg  IV q24h.  Measure Vanc trough at steady state.  Follow up renal fxn and culture results.  Gretta Arab PharmD, BCPS Pager [phone] 12/20/2013 5:44 PM

## 2013-12-20 NOTE — Consult Note (Signed)
Jesse Chandler for Infectious Disease  Date of Admission:  12/20/2013  Date of Consult:  12/20/2013  Reason for Consult: infected decubitus ulcer Referring Physician: Candiss Norse  Impression/Recommendation Infected Decubitus Ulcer DM Metastatic Prostate Disease Severe protein calorie malnutrition  Would Continue vanco/imipenem Await ortho opinion Air mattress Off loading Wound care eval  Nutrition eval (his albumin is 2.3)   Comment- I spoke with pt's grand-daughter and suggested that the decision to have surgery was between the pt and the surgeon, however I very much doubt that this limb is viable. She said that they expected he would need surgery but were not sure about his peri-operative risk and his longevity as it would relate to his surgery.  His stool was soft but was not diarrhea when I examined him.   Thank you so much for this interesting consult,   Bobby Rumpf (pager) [phone] [email or web address]  Jesse Chandler is an 80 y.o. male.  HPI: 80 yo M with hx of DM, dementia, prev CVAs, CKD, bed bound and metastatic prostate cancer. He previously had a L heel decubitus noted in early fall 2013. His Cx at that time grew MRSA and Acinetobacter (S- unasyn, imipenem, R- cipro, bactrim. I- levaquin).  He was treated with augmentin/doxy after being seen in ID clinic in October. He underwent MRI 07-2012 showing: Lateral calcaneal tuberosity osteomyelitis with ulceration and sinus tract extending from scan to bone surface. No soft tissue abscess. He was evaluated by orthopedics and pt deferred surgery. He was kept on po anbx (as suppressive therapy) until May 2014 when he underwent L BKA. This was followed by IV vanco/ceftaz for 2 weeks. He was seen in ID f/u 02-2013 and was noted to have d/c at his wound site (Cx-). He was started back on doxycycline.  His course was then complicated by R ankle ulcer, pressure ulcer on his L leg from his cast and new great toe ulcer on R.  He  was last seen in ID October 2014 and was continued on doxy (wounds improving at that time).  He has received a recent 10 day course of levaquin, and then started on anbx again 2 days prior to adm.  He was sent to [facility] ED today after being seen in [facility] and thought to have exposed tendon in his R leg wound. He also has stage II sacral decubitus and mild diarrhea. He is afebrile, WBC is 17.1.     Past Medical History  Diagnosis Date  . Prostate cancer   . Hypertension   . Hypothyroidism   . Psychosis   . CKD (chronic kidney disease) stage 3, GFR 30-59 ml/min     Baseline creatinine 1.7  . Posttraumatic stress disorder   . Dementia   . Recurrent right pleural effusion     H/O pleural catheter  . PAF (paroxysmal atrial fibrillation)   . Diabetes mellitus     no medication  . Anemia of chronic disease     s/p tx with erythropoietin and iron  . Catheter (urine) change required     Has indwelling foley  . MRSA (methicillin resistant staph aureus) culture positive 06-21-2012      10-18-2012 in [facility]  . Osteomyelitis   . Urinary obstruction     indwelling cath x 1 yr  . Urinary retention   . Pressure ulcer of heel left  . Dementia     w/o behavior disturbance  . Peripheral neuropathy   . GERD (gastroesophageal  reflux disease)   . Refusal of blood transfusions as patient is Jehovah's Witness   . Shortness of breath     Past Surgical History  Procedure Laterality Date  . Orchiectomy      bilateral  . Subdural hematoma evacuation via craniotomy    . Cholecystectomy    . Insertion of suprapubic catheter N/A 01/05/2013    Procedure: INSERTION OF SUPRAPUBIC CATHETER;  Surgeon: Franchot Gallo, MD;  Location: [facility];  Service: Urology;  Laterality: N/A;  . Leg amputation below knee Left 02/09/2013    Dr Doran Durand  . Amputation Left 02/09/2013    Procedure: LEFT AMPUTATION BELOW KNEE;  Surgeon: Wylene Simmer, MD;  Location: [facility];  Service: Orthopedics;  Laterality:  Left;     No Known Allergies  Medications:  Scheduled: . B-complex with vitamin C  1 tablet Oral Daily  . calcium-vitamin D  1 tablet Oral BID  . [START ON 12/21/2013] diltiazem  240 mg Oral Daily  . enzalutamide  160 mg Oral QPC supper  . [START ON 12/21/2013] feeding supplement (PRO-STAT SUGAR FREE 64)  30 mL Oral Daily  . ferrous fumarate  1 tablet Oral BID  . [START ON 4/94/4967] folic acid  1 mg Oral Daily  . furosemide  60 mg Oral BID  . guaiFENesin  1,200 mg Oral BID  . heparin  5,000 Units Subcutaneous 3 times per day  . insulin aspart  0-15 Units Subcutaneous TID WC  . insulin aspart  0-5 Units Subcutaneous QHS  . insulin glargine  12 Units Subcutaneous Daily  . [START ON 12/21/2013] levothyroxine  50 mcg Oral QAC breakfast  . meropenem (MERREM) IV  1 g Intravenous NOW  . multivitamin with minerals  1 tablet Oral Daily  . pantoprazole  40 mg Oral Daily  . [START ON 12/21/2013] polyethylene glycol  17 g Oral Daily  . potassium chloride SA  40 mEq Oral BID  . saccharomyces boulardii  250 mg Oral BID  . vancomycin  2,000 mg Intravenous Once  . vitamin C  500 mg Oral Daily    Abtx:  Anti-infectives   Start     Dose/Rate Route Frequency Ordered Stop   12/20/13 1715  meropenem (MERREM) 1 g in sodium chloride 0.9 % 100 mL IVPB     1 g 200 mL/hr over 30 Minutes Intravenous NOW 12/20/13 1711 12/21/13 1715   12/20/13 1445  vancomycin (VANCOCIN) 2,000 mg in sodium chloride 0.9 % 500 mL IVPB     2,000 mg 250 mL/hr over 120 Minutes Intravenous  Once 12/20/13 1427        Total days of antibiotics 0          Social History:  reports that he has quit smoking. He has never used smokeless tobacco. He reports that he does not drink alcohol or use illicit drugs.  Family History  Problem Relation Age of Onset  . Hypertension Mother     General ROS: denies f/c, multiple loose BM, no problems with urination. see HPI  Blood pressure 119/47, pulse 80, temperature 97.6 F (36.4 C),  temperature source Oral, resp. rate 15, SpO2 100.00%. General appearance: alert, cooperative, no distress and MSE 3/3 Eyes: negative findings: pupils equal, round, reactive to light and accomodation Throat: normal findings: oropharynx pink & moist without lesions or evidence of thrush Neck: no adenopathy and supple, symmetrical, trachea midline Lungs: clear to auscultation bilaterally Heart: regular rate and rhythm Abdomen: normal findings: bowel sounds normal and soft, non-tender  Extremities: LLE stump is well healed. RLE shows a ~ 1 cm ulcer on tip of great toe. there are 2 adjacent ulcers on his calf- superior is ~ 6x5 cm, some necrotic tissue present, muscle is clearly seen. lower is ~4x3 cm, some necrotic tissue present. muscle is clearly seen. His gluteal area shows a large decubitus ulcer which involves both buttocks, it is stage 2-3.   Results for orders placed during the hospital encounter of 12/20/13 (from the past 48 hour(s))  CBC     Status: Abnormal   Collection Time    12/20/13 12:48 PM      Result Value Ref Range   WBC 17.1 (*) 4.0 - 10.5 K/uL   RBC 3.05 (*) 4.22 - 5.81 MIL/uL   Hemoglobin 8.7 (*) 13.0 - 17.0 g/dL   HCT 27.9 (*) 39.0 - 52.0 %   MCV 91.5  78.0 - 100.0 fL   MCH 28.5  26.0 - 34.0 pg   MCHC 31.2  30.0 - 36.0 g/dL   RDW 15.5  11.5 - 15.5 %   Platelets 287  150 - 400 K/uL  COMPREHENSIVE METABOLIC PANEL     Status: Abnormal   Collection Time    12/20/13 12:48 PM      Result Value Ref Range   Sodium 138  137 - 147 mEq/L   Potassium 4.9  3.7 - 5.3 mEq/L   Chloride 105  96 - 112 mEq/L   CO2 21  19 - 32 mEq/L   Glucose, Bld 222 (*) 70 - 99 mg/dL   BUN 34 (*) 6 - 23 mg/dL   Creatinine, Ser 1.21  0.50 - 1.35 mg/dL   Calcium 9.7  8.4 - 10.5 mg/dL   Total Protein 7.0  6.0 - 8.3 g/dL   Albumin 2.3 (*) 3.5 - 5.2 g/dL   AST 10  0 - 37 U/L   ALT <5  0 - 53 U/L   Comment: REPEATED TO VERIFY   Alkaline Phosphatase 115  39 - 117 U/L   Total Bilirubin <0.2 (*) 0.3  - 1.2 mg/dL   GFR calc non Af Amer 52 (*) >90 mL/min   GFR calc Af Amer 60 (*) >90 mL/min   Comment: (NOTE)     The eGFR has been calculated using the CKD EPI equation.     This calculation has not been validated in all clinical situations.     eGFR's persistently <90 mL/min signify possible Chronic Kidney     Disease.  I-STAT CG4 LACTIC ACID, ED     Status: None   Collection Time    12/20/13  1:02 PM      Result Value Ref Range   Lactic Acid, Venous 1.66  0.5 - 2.2 mmol/L  URINALYSIS, ROUTINE W REFLEX MICROSCOPIC     Status: Abnormal   Collection Time    12/20/13  3:20 PM      Result Value Ref Range   Color, Urine YELLOW  YELLOW   APPearance CLOUDY (*) CLEAR   Specific Gravity, Urine 1.015  1.005 - 1.030   pH 5.0  5.0 - 8.0   Glucose, UA NEGATIVE  NEGATIVE mg/dL   Hgb urine dipstick LARGE (*) NEGATIVE   Bilirubin Urine NEGATIVE  NEGATIVE   Ketones, ur NEGATIVE  NEGATIVE mg/dL   Protein, ur 30 (*) NEGATIVE mg/dL   Urobilinogen, UA 0.2  0.0 - 1.0 mg/dL   Nitrite NEGATIVE  NEGATIVE   Leukocytes, UA SMALL (*) NEGATIVE  URINE MICROSCOPIC-ADD  ON     Status: Abnormal   Collection Time    12/20/13  3:20 PM      Result Value Ref Range   Squamous Epithelial / LPF RARE  RARE   WBC, UA 3-6  <3 WBC/hpf   RBC / HPF TOO NUMEROUS TO COUNT  <3 RBC/hpf   Bacteria, UA RARE  RARE   Casts HYALINE CASTS (*) NEGATIVE   Urine-Other MUCOUS PRESENT        Component Value Date/Time   SDES URINE, RANDOM 12/09/2011 1833   SPECREQUEST NONE 12/09/2011 1833   CULT NO GROWTH 12/09/2011 1833   REPTSTATUS 12/10/2011 FINAL 12/09/2011 1833   Dg Chest Port 1 View  12/20/2013   CLINICAL DATA:  Fever, atrial fibrillation  EXAM: PORTABLE CHEST - 1 VIEW  COMPARISON:  12/14/2013  FINDINGS: Background hyperinflation compatible with COPD/ emphysema. Similar small pleural effusions persist with compressive basilar atelectasis. No significant interval change. Upper lobes remain clear. No pneumothorax. Trachea is  midline.  IMPRESSION: COPD/ emphysema  Similar small effusions with basilar compressive atelectasis   Electronically Signed   By: Daryll Brod M.D.   On: 12/20/2013 15:56   No results found for this or any previous visit (from the past 240 hour(s)).    12/20/2013, 5:18 PM     LOS: 0 days

## 2013-12-20 NOTE — Plan of Care (Signed)
Problem: Phase I Progression Outcomes Goal: Voiding-avoid urinary catheter unless indicated Outcome: Not Applicable Date Met:  29/19/16 Patient has suprapubic cath in place

## 2013-12-20 NOTE — ED Notes (Signed)
Bed: WA08 Expected date:  Expected time:  Means of arrival:  Comments: 

## 2013-12-21 ENCOUNTER — Inpatient Hospital Stay (HOSPITAL_COMMUNITY): Payer: PRIVATE HEALTH INSURANCE

## 2013-12-21 DIAGNOSIS — L89899 Pressure ulcer of other site, unspecified stage: Secondary | ICD-10-CM

## 2013-12-21 DIAGNOSIS — N429 Disorder of prostate, unspecified: Secondary | ICD-10-CM

## 2013-12-21 LAB — RETICULOCYTES
RBC.: 2.82 MIL/uL — ABNORMAL LOW (ref 4.22–5.81)
RETIC CT PCT: 3.5 % — AB (ref 0.4–3.1)
Retic Count, Absolute: 98.7 10*3/uL (ref 19.0–186.0)

## 2013-12-21 LAB — GLUCOSE, CAPILLARY
Glucose-Capillary: 77 mg/dL (ref 70–99)
Glucose-Capillary: 114 mg/dL — ABNORMAL HIGH (ref 70–99)
Glucose-Capillary: 92 mg/dL (ref 70–99)
Glucose-Capillary: 74 mg/dL (ref 70–99)

## 2013-12-21 LAB — CBC
HCT: 25.5 % — ABNORMAL LOW (ref 39.0–52.0)
Hemoglobin: 7.6 g/dL — ABNORMAL LOW (ref 13.0–17.0)
MCH: 27.5 pg (ref 26.0–34.0)
MCHC: 29.8 g/dL — AB (ref 30.0–36.0)
MCV: 92.4 fL (ref 78.0–100.0)
Platelets: 262 10*3/uL (ref 150–400)
RBC: 2.76 MIL/uL — ABNORMAL LOW (ref 4.22–5.81)
RDW: 15.8 % — AB (ref 11.5–15.5)
WBC: 12.2 10*3/uL — AB (ref 4.0–10.5)

## 2013-12-21 LAB — BASIC METABOLIC PANEL
BUN: 30 mg/dL — AB (ref 6–23)
CO2: 19 mEq/L (ref 19–32)
CREATININE: 1.13 mg/dL (ref 0.50–1.35)
Calcium: 9.2 mg/dL (ref 8.4–10.5)
Chloride: 106 mEq/L (ref 96–112)
GFR calc Af Amer: 65 mL/min — ABNORMAL LOW (ref >90)
GFR, EST NON AFRICAN AMERICAN: 56 mL/min — AB (ref >90)
Glucose, Bld: 123 mg/dL — ABNORMAL HIGH (ref 70–99)
POTASSIUM: 4.7 meq/L (ref 3.7–5.3)
Sodium: 137 mEq/L (ref 137–147)

## 2013-12-21 LAB — FOLATE: Folate: >20 ng/mL

## 2013-12-21 LAB — TROPONIN I: Troponin I: <0.3 ng/mL (ref <0.30)

## 2013-12-21 LAB — VITAMIN B12: Vitamin B-12: 1227 pg/mL — ABNORMAL HIGH (ref 211–911)

## 2013-12-21 LAB — FERRITIN: Ferritin: 290 ng/mL (ref 22–322)

## 2013-12-21 MED ORDER — CHLORHEXIDINE GLUCONATE 4 % EX LIQD
60.0000 mL | Freq: Once | CUTANEOUS | Status: DC
  Filled 2013-12-21 (×2): qty 60

## 2013-12-21 MED ORDER — ASPIRIN 81 MG PO CHEW
81.0000 mg | CHEWABLE_TABLET | Freq: Every day | ORAL | Status: DC
  Administered 2013-12-21 – 2013-12-23 (×2): 81 mg via ORAL
  Filled 2013-12-21 (×3): qty 1

## 2013-12-21 MED ORDER — INSULIN GLARGINE 100 UNIT/ML ~~LOC~~ SOLN
10.0000 [IU] | Freq: Every day | SUBCUTANEOUS | Status: DC
  Administered 2013-12-21: 10 [IU] via SUBCUTANEOUS
  Filled 2013-12-21 (×3): qty 0.1

## 2013-12-21 MED ORDER — JUVEN PO PACK
1.0000 | PACK | Freq: Two times a day (BID) | ORAL | Status: DC
  Administered 2013-12-21 – 2013-12-27 (×10): 1 via ORAL
  Filled 2013-12-21 (×13): qty 1

## 2013-12-21 MED ORDER — HEPARIN SODIUM (PORCINE) 5000 UNIT/ML IJ SOLN
5000.0000 [IU] | Freq: Three times a day (TID) | INTRAMUSCULAR | Status: DC
  Administered 2013-12-21: 5000 [IU] via SUBCUTANEOUS
  Filled 2013-12-21 (×2): qty 1

## 2013-12-21 MED ORDER — GLUCERNA SHAKE PO LIQD
237.0000 mL | Freq: Three times a day (TID) | ORAL | Status: DC
  Administered 2013-12-23 – 2013-12-27 (×12): 237 mL via ORAL
  Filled 2013-12-21 (×2): qty 237

## 2013-12-21 MED ORDER — SODIUM CHLORIDE 0.9 % IJ SOLN
10.0000 mL | INTRAMUSCULAR | Status: DC | PRN
  Administered 2013-12-23: 20 mL
  Administered 2013-12-24 – 2013-12-26 (×2): 10 mL

## 2013-12-21 MED ORDER — METOPROLOL TARTRATE 12.5 MG HALF TABLET
12.5000 mg | ORAL_TABLET | Freq: Two times a day (BID) | ORAL | Status: DC
  Administered 2013-12-21 – 2013-12-22 (×3): 12.5 mg via ORAL
  Filled 2013-12-21 (×7): qty 1

## 2013-12-21 NOTE — Progress Notes (Signed)
INITIAL NUTRITION ASSESSMENT  DOCUMENTATION CODES Per approved criteria  -Not Applicable   INTERVENTION: - Glucerna shake TID, Juven 1 packet BID - Encouraged increased meal intake of protein rich foods - Will continue to monitor   NUTRITION DIAGNOSIS: Increased nutrient needs related to stage III sacral pressure ulcer, stage IV right leg pressure ulcer as evidenced by RN notes.   Goal: Pt to consume 100% of meals and supplements  Monitor:  Weights, labs, intake  Reason for Assessment: Consult   80 y.o. male  Admitting Dx: Infected pressure ulcer  ASSESSMENT: Admitted from wound center, has extensive pressure ulcers, stage III sacral pressure ulcer, stage IV right leg pressure ulcer, left BKA. Pt with mild diarrhea, prostate CA, HTN, CKD stage 3, dementia, DM, and peripheral neuropathy.   Met with daughter who reports pt was eating 3 meals/day at SNF and has had stable weight. Thinks his appetite was down slightly recently. Reports pt needs softer food as he has had problems chewing foods. Was on Prostat once daily PTA which RN reports pt consumed today. Eating 50% of meals.   Pt transferring to [facility] today for possible right leg amputation.   Getting vitamin C with B-complex, folic acid, multivitamin, and Florastor  Lab Results  Component Value Date   HGBA1C 8.0* 12/20/2013     Height: Ht Readings from Last 1 Encounters:  12/20/13 6' 1" (1.854 m)    Weight: Wt Readings from Last 1 Encounters:  12/21/13 225 lb 12 oz (102.4 kg)    Ideal Body Weight: 172 lbs (adjusted for left BKA)  % Ideal Body Weight: 131%   Wt Readings from Last 10 Encounters:  12/21/13 225 lb 12 oz (102.4 kg)  05/31/13 215 lb (97.523 kg)  02/12/13 235 lb (106.595 kg)  02/12/13 235 lb (106.595 kg)  02/07/13 235 lb (106.595 kg)  01/05/13 223 lb 15.8 oz (101.6 kg)  01/05/13 223 lb 15.8 oz (101.6 kg)  12/27/12 224 lb (101.606 kg)  12/09/11 238 lb 8 oz (108.183 kg)    Usual Body Weight:  225 lbs per daughter  % Usual Body Weight: 100%  BMI:  Body mass index is 29.79 kg/(m^2).  Estimated Nutritional Needs: Kcal: 2100-2300 Protein: 125-150g Fluid: 2.1-2.3L/day  Skin: Stage III sacral pressure ulcer, stage IV right leg pressure ulcer   Diet Order: Carb Control  EDUCATION NEEDS: -No education needs identified at this time   Intake/Output Summary (Last 24 hours) at 12/21/13 1430 Last data filed at 12/21/13 1344  Gross per 24 hour  Intake 1380.83 ml  Output    900 ml  Net 480.83 ml    Last BM: 3/25  Labs:   Recent Labs Lab 12/20/13 1248 12/21/13 0440  NA 138 137  K 4.9 4.7  CL 105 106  CO2 21 19  BUN 34* 30*  CREATININE 1.21 1.13  CALCIUM 9.7 9.2  GLUCOSE 222* 123*    CBG (last 3)   Recent Labs  12/20/13 2211 12/21/13 0726 12/21/13 1142  GLUCAP 198* 77 114*    Scheduled Meds: . aspirin  81 mg Oral Daily  . B-complex with vitamin C  1 tablet Oral Daily  . calcium-vitamin D  1 tablet Oral BID  . diltiazem  240 mg Oral Daily  . enzalutamide  160 mg Oral QPC supper  . feeding supplement (GLUCERNA SHAKE)  237 mL Oral TID BM  . feeding supplement (PRO-STAT SUGAR FREE 64)  30 mL Oral Daily  . ferrous fumarate  1 tablet Oral   BID  . folic acid  1 mg Oral Daily  . furosemide  60 mg Oral BID  . guaiFENesin  1,200 mg Oral BID  . heparin  5,000 Units Subcutaneous 3 times per day  . influenza vac split quadrivalent PF  0.5 mL Intramuscular Tomorrow-1000  . insulin aspart  0-15 Units Subcutaneous TID WC  . insulin aspart  0-5 Units Subcutaneous QHS  . insulin glargine  10 Units Subcutaneous Daily  . levothyroxine  50 mcg Oral QAC breakfast  . meropenem (MERREM) IV  1 g Intravenous Q8H  . metoprolol tartrate  12.5 mg Oral BID  . multivitamin with minerals  1 tablet Oral Daily  . nutrition supplement (JUVEN)  1 packet Oral BID BM  . pantoprazole  40 mg Oral Daily  . pneumococcal 23 valent vaccine  0.5 mL Intramuscular Tomorrow-1000  .  polyethylene glycol  17 g Oral Daily  . potassium chloride SA  40 mEq Oral BID  . saccharomyces boulardii  250 mg Oral BID  . vancomycin  1,500 mg Intravenous Q24H  . vitamin C  500 mg Oral Daily    Continuous Infusions: . sodium chloride 50 mL/hr at 12/20/13 1759    Past Medical History  Diagnosis Date  . Prostate cancer   . Hypertension   . Hypothyroidism   . Psychosis   . CKD (chronic kidney disease) stage 3, GFR 30-59 ml/min     Baseline creatinine 1.7  . Posttraumatic stress disorder   . Dementia   . Recurrent right pleural effusion     H/O pleural catheter  . PAF (paroxysmal atrial fibrillation)   . Diabetes mellitus     no medication  . Anemia of chronic disease     s/p tx with erythropoietin and iron  . Catheter (urine) change required     Has indwelling foley  . MRSA (methicillin resistant staph aureus) culture positive 06-21-2012      10-18-2012 in [facility]  . Osteomyelitis   . Urinary obstruction     indwelling cath x 1 yr  . Urinary retention   . Pressure ulcer of heel left  . Dementia     w/o behavior disturbance  . Peripheral neuropathy   . GERD (gastroesophageal reflux disease)   . Refusal of blood transfusions as patient is Jehovah's Witness   . Shortness of breath     Past Surgical History  Procedure Laterality Date  . Orchiectomy      bilateral  . Subdural hematoma evacuation via craniotomy    . Cholecystectomy    . Insertion of suprapubic catheter N/A 01/05/2013    Procedure: INSERTION OF SUPRAPUBIC CATHETER;  Surgeon: Franchot Gallo, MD;  Location: [facility];  Service: Urology;  Laterality: N/A;  . Leg amputation below knee Left 02/09/2013    Dr Doran Durand  . Amputation Left 02/09/2013    Procedure: LEFT AMPUTATION BELOW KNEE;  Surgeon: Wylene Simmer, MD;  Location: [facility];  Service: Orthopedics;  Laterality: Left;    Mikey College MS, [place], [place] Pager [phone] After Hours Pager

## 2013-12-21 NOTE — Progress Notes (Signed)
Patient Demographics  Jesse Chandler, is a 80 y.o. male, DOB - Oct 12, 1923, YWV:[id number]  Admit date - 12/20/2013   Admitting Physician Thurnell Lose, MD  Outpatient Primary MD for the patient is [place], [place]  LOS - 1   Chief Complaint  Patient presents with  . wound, pressure ulcers         Assessment & Plan    1. Right leg diabetic ulcer suspicious for osteomyelitis - blood cultures obtained, wound culture obtained in the ER, for now continue on vancomycin and meropenem. I have consulted infectious disease physician Dr. Johnnye Sima and Dr. Doran Durand orthopedics, of the agreed that amputation might be the best outcome, I have discussed with patient and his daughter at that patient might end up losing his right leg and she is okay with an amputation if needed. Per Dr. Doran Durand patient will be transferred to [facility] for a possible BKA on 12/22/2013.     2. Stage II sacral decubitus ulcer. Resulted wound care.      3. Mild diarrhea. Likely due to being on 3 different antibiotics including Augmentin, placed on probiotic and ruled out C. Difficile.      4. Diabetes mellitus type 2. In poor control, checked A1c, place have lowered the started Lantus dose, continue sliding scale insulin.    Lab Results  Component Value Date   HGBA1C 8.0* 12/20/2013    CBG (last 3)   Recent Labs  12/20/13 1935 12/20/13 2211 12/21/13 0726  GLUCAP 153* 198* 77      5. History of paroxysmal atrial fibrillation. Goal rate control, place on low-dose beta blocker, Not on anticoagulation. Telemetry monitoring while here.     6. Hypothyroidism continue home Synthroid.      7. CKD stage III. Currently appears to be at baseline from 1.3.      8. History of prostate cancer-unit attention.  Status post suprapubic catheter, catheter was changed today to go. Will obtain baseline you and monitor.      9. Left bundle branch block - unknown chronicity, previous EKG shows partial block, no chest pain, will check serial troponin and an echogram to rule out an acute event.    10. Anemia - likely anemia of chronic disease, check anemia panel.     Code Status: Full  Family Communication: daughter yesterday, left detail message today  Disposition Plan: SNF   Procedures  Echo ordered   Consults ID Dr. Johnnye Sima, orthopedics Dr. Doran Durand   Medications  Scheduled Meds: . B-complex with vitamin C  1 tablet Oral Daily  . calcium-vitamin D  1 tablet Oral BID  . diltiazem  240 mg Oral Daily  . enzalutamide  160 mg Oral QPC supper  . feeding supplement (PRO-STAT SUGAR FREE 64)  30 mL Oral Daily  . ferrous fumarate  1 tablet Oral BID  . folic acid  1 mg Oral Daily  . furosemide  60 mg Oral BID  . guaiFENesin  1,200 mg Oral BID  . heparin  5,000 Units Subcutaneous 3 times per day  . influenza vac split quadrivalent PF  0.5 mL Intramuscular Tomorrow-1000  . insulin aspart  0-15 Units Subcutaneous TID WC  . insulin aspart  0-5 Units Subcutaneous QHS  .  insulin glargine  10 Units Subcutaneous Daily  . levothyroxine  50 mcg Oral QAC breakfast  . meropenem (MERREM) IV  1 g Intravenous Q8H  . multivitamin with minerals  1 tablet Oral Daily  . pantoprazole  40 mg Oral Daily  . pneumococcal 23 valent vaccine  0.5 mL Intramuscular Tomorrow-1000  . polyethylene glycol  17 g Oral Daily  . potassium chloride SA  40 mEq Oral BID  . saccharomyces boulardii  250 mg Oral BID  . vancomycin  1,500 mg Intravenous Q24H  . vitamin C  500 mg Oral Daily   Continuous Infusions: . sodium chloride 50 mL/hr at 12/20/13 1759   PRN Meds:.guaiFENesin-dextromethorphan, HYDROcodone-acetaminophen, ipratropium-albuterol, loperamide, magnesium hydroxide, morphine injection, ondansetron (ZOFRAN) IV,  ondansetron  DVT Prophylaxis  Heparin   Lab Results  Component Value Date   PLT 262 12/21/2013    Antibiotics    Anti-infectives   Start     Dose/Rate Route Frequency Ordered Stop   12/21/13 1600  vancomycin (VANCOCIN) 1,500 mg in sodium chloride 0.9 % 500 mL IVPB     1,500 mg 250 mL/hr over 120 Minutes Intravenous Every 24 hours 12/20/13 1747     12/21/13 0200  meropenem (MERREM) 1 g in sodium chloride 0.9 % 100 mL IVPB     1 g 200 mL/hr over 30 Minutes Intravenous Every 8 hours 12/20/13 1747     12/20/13 1715  meropenem (MERREM) 1 g in sodium chloride 0.9 % 100 mL IVPB     1 g 200 mL/hr over 30 Minutes Intravenous NOW 12/20/13 1711 12/20/13 1840   12/20/13 1445  vancomycin (VANCOCIN) 2,000 mg in sodium chloride 0.9 % 500 mL IVPB     2,000 mg 250 mL/hr over 120 Minutes Intravenous  Once 12/20/13 1427 12/20/13 1805          Subjective:   Jesse Chandler today has, No headache, No chest pain, No abdominal pain - No Nausea, No new weakness tingling or numbness, No Cough - SOB.   Objective:   Filed Vitals:   12/20/13 1800 12/20/13 2203 12/21/13 0225 12/21/13 0507  BP: 120/48 125/39 108/57 112/55  Pulse: 79 90 74 64  Temp: 97.8 F (36.6 C) 97.8 F (36.6 C) 97.9 F (36.6 C) 98 F (36.7 C)  TempSrc: Oral Oral Oral Oral  Resp: 16 16 16 16   Height:      Weight:    102.4 kg (225 lb 12 oz)  SpO2: 100% 100% 100% 100%    Wt Readings from Last 3 Encounters:  12/21/13 102.4 kg (225 lb 12 oz)  05/31/13 97.523 kg (215 lb)  02/12/13 106.595 kg (235 lb)     Intake/Output Summary (Last 24 hours) at 12/21/13 1047 Last data filed at 12/21/13 0600  Gross per 24 hour  Intake 840.83 ml  Output    900 ml  Net -59.17 ml     Physical Exam  Awake Alert, Oriented X 3, No new F.N deficits, Normal affect [place].AT,PERRAL Supple Neck,No JVD, No cervical lymphadenopathy appriciated.  Symmetrical Chest wall movement, Good air movement bilaterally, CTAB RRR,No Gallops,Rubs or new  Murmurs, No Parasternal Heave +ve B.Sounds, Abd Soft, Non tender, No organomegaly appriciated, No rebound - guarding or rigidity. Suprapubic catheter in place No Cyanosis, Clubbing or edema, No new Rash or bruise, left BKA, right leg ulcer under bandage.   Data Review   Micro Results Recent Results (from the past 240 hour(s))  CULTURE, BLOOD (ROUTINE X 2)     Status:  None   Collection Time    12/20/13 12:48 PM      Result Value Ref Range Status   Specimen Description BLOOD RIGHT ANTECUBITAL   Final   Special Requests BOTTLES DRAWN AEROBIC AND ANAEROBIC 5ML   Final   Culture  Setup Time     Final   Value: 12/20/2013 16:03     Performed at [facility]   Culture     Final   Value:        BLOOD CULTURE RECEIVED NO GROWTH TO DATE CULTURE WILL BE HELD FOR 5 DAYS BEFORE ISSUING A FINAL NEGATIVE REPORT     Performed at [facility]   Report Status PENDING   Incomplete  CULTURE, BLOOD (ROUTINE X 2)     Status: None   Collection Time    12/20/13 12:48 PM      Result Value Ref Range Status   Specimen Description BLOOD LEFT ANTECUBITAL   Final   Special Requests BOTTLES DRAWN AEROBIC AND ANAEROBIC 5ML   Final   Culture  Setup Time     Final   Value: 12/20/2013 16:02     Performed at [facility]   Culture     Final   Value:        BLOOD CULTURE RECEIVED NO GROWTH TO DATE CULTURE WILL BE HELD FOR 5 DAYS BEFORE ISSUING A FINAL NEGATIVE REPORT     Performed at [facility]   Report Status PENDING   Incomplete  WOUND CULTURE     Status: None   Collection Time    12/20/13  2:42 PM      Result Value Ref Range Status   Specimen Description WOUND   Final   Special Requests Normal   Final   Gram Stain     Final   Value: NO WBC SEEN     NO SQUAMOUS EPITHELIAL CELLS SEEN     RARE GRAM POSITIVE COCCI IN PAIRS     RARE GRAM NEGATIVE RODS     Performed at [facility]   Culture     Final   Value: Culture reincubated for better growth     Performed at  [facility]   Report Status PENDING   Incomplete  CLOSTRIDIUM DIFFICILE BY PCR     Status: None   Collection Time    12/20/13  3:20 PM      Result Value Ref Range Status   C difficile by pcr NEGATIVE  NEGATIVE Final   Comment: Performed at [facility]    Radiology Reports Dg Chest Port 1 View  12/20/2013   CLINICAL DATA:  Fever, atrial fibrillation  EXAM: PORTABLE CHEST - 1 VIEW  COMPARISON:  12/14/2013  FINDINGS: Background hyperinflation compatible with COPD/ emphysema. Similar small pleural effusions persist with compressive basilar atelectasis. No significant interval change. Upper lobes remain clear. No pneumothorax. Trachea is midline.  IMPRESSION: COPD/ emphysema  Similar small effusions with basilar compressive atelectasis   Electronically Signed   By: Daryll Brod M.D.   On: 12/20/2013 15:56    CBC  Recent Labs Lab 12/20/13 1248 12/21/13 0440  WBC 17.1* 12.2*  HGB 8.7* 7.6*  HCT 27.9* 25.5*  PLT 287 262  MCV 91.5 92.4  MCH 28.5 27.5  MCHC 31.2 29.8*  RDW 15.5 15.8*    Chemistries   Recent Labs Lab 12/20/13 1248 12/21/13 0440  NA 138 137  K 4.9 4.7  CL 105 106  CO2  21 19  GLUCOSE 222* 123*  BUN 34* 30*  CREATININE 1.21 1.13  CALCIUM 9.7 9.2  AST 10  --   ALT <5  --   ALKPHOS 115  --   BILITOT <0.2*  --    ------------------------------------------------------------------------------------------------------------------ estimated creatinine clearance is 56.8 ml/min (by C-G formula based on Cr of 1.13). ------------------------------------------------------------------------------------------------------------------  Recent Labs  12/20/13 1248  HGBA1C 8.0*   ------------------------------------------------------------------------------------------------------------------ No results found for this basename: CHOL, HDL, LDLCALC, TRIG, CHOLHDL, LDLDIRECT,  in the last 72  hours ------------------------------------------------------------------------------------------------------------------ No results found for this basename: TSH, T4TOTAL, FREET3, T3FREE, THYROIDAB,  in the last 72 hours ------------------------------------------------------------------------------------------------------------------ No results found for this basename: VITAMINB12, FOLATE, FERRITIN, TIBC, IRON, RETICCTPCT,  in the last 72 hours  Coagulation profile No results found for this basename: INR, PROTIME,  in the last 168 hours  No results found for this basename: DDIMER,  in the last 72 hours  Cardiac Enzymes No results found for this basename: CK, CKMB, TROPONINI, MYOGLOBIN,  in the last 168 hours ------------------------------------------------------------------------------------------------------------------ No components found with this basename: POCBNP,      Time Spent in minutes  35   SINGH,PRASHANT K M.D on 12/21/2013 at 10:47 AM  Between 7am to 7pm - Pager - [phone]  After 7pm go to [email or web address] - password [email or web address]  And look for the night coverage person covering for me after hours  [facility] Office  [phone]

## 2013-12-21 NOTE — Progress Notes (Signed)
Peripherally Inserted Central Catheter/Midline Placement  The IV Nurse has discussed with the patient and/or persons authorized to consent for the patient, the purpose of this procedure and the potential benefits and risks involved with this procedure.  The benefits include less needle sticks, lab draws from the catheter and patient may be discharged home with the catheter.  Risks include, but not limited to, infection, bleeding, blood clot (thrombus formation), and puncture of an artery; nerve damage and irregular heat beat.  Alternatives to this procedure were also discussed.  Patient requested that daughter sign consent.   PICC/Midline Placement Documentation  PICC / Midline Single Lumen 48/18/56 PICC Left Basilic 50 cm 0 cm (Active)       Jesse Chandler, Nicolette Bang 12/21/2013, 7:17 PM

## 2013-12-21 NOTE — Consult Note (Signed)
Reason for Consult:  Right leg wound Referring Physician:  Dr. Isidore Moos is an 80 y.o. male.  HPI:  80 y/o male with PMH of diabetes and L BKA for nonhealing diabetic ulcer was admitted yesterday with cellulitis of the right lower extremity.  He denies any pain in the right leg.  His daughter has noted progression of the wound over the last few weeks.  He denies any f/c/n/v.  He had a hard time healing the left lower extremity BKA wound last year.  I'm asked to consult for management of the right lower extremity wound.  Past Medical History  Diagnosis Date  . Prostate cancer   . Hypertension   . Hypothyroidism   . Psychosis   . CKD (chronic kidney disease) stage 3, GFR 30-59 ml/min     Baseline creatinine 1.7  . Posttraumatic stress disorder   . Dementia   . Recurrent right pleural effusion     H/O pleural catheter  . PAF (paroxysmal atrial fibrillation)   . Diabetes mellitus     no medication  . Anemia of chronic disease     s/p tx with erythropoietin and iron  . Catheter (urine) change required     Has indwelling foley  . MRSA (methicillin resistant staph aureus) culture positive 06-21-2012      10-18-2012 in Epic [facility]  . Osteomyelitis   . Urinary obstruction     indwelling cath x 1 yr  . Urinary retention   . Pressure ulcer of heel left  . Dementia     w/o behavior disturbance  . Peripheral neuropathy   . GERD (gastroesophageal reflux disease)   . Refusal of blood transfusions as patient is Jehovah's Witness   . Shortness of breath     Past Surgical History  Procedure Laterality Date  . Orchiectomy      bilateral  . Subdural hematoma evacuation via craniotomy    . Cholecystectomy    . Insertion of suprapubic catheter N/A 01/05/2013    Procedure: INSERTION OF SUPRAPUBIC CATHETER;  Surgeon: Franchot Gallo, MD;  Location: [facility];  Service: Urology;  Laterality: N/A;  . Leg amputation below knee Left 02/09/2013    Dr Doran Durand  .  Amputation Left 02/09/2013    Procedure: LEFT AMPUTATION BELOW KNEE;  Surgeon: Wylene Simmer, MD;  Location: [place] [facility];  Service: Orthopedics;  Laterality: Left;    Family History  Problem Relation Age of Onset  . Hypertension Mother     Social History:  reports that he has quit smoking. He has never used smokeless tobacco. He reports that he does not drink alcohol or use illicit drugs.  Allergies: No Known Allergies  Medications: I have reviewed the patient's current medications.  Results for orders placed during the hospital encounter of 12/20/13 (from the past 48 hour(s))  CBC     Status: Abnormal   Collection Time    12/20/13 12:48 PM      Result Value Ref Range   WBC 17.1 (*) 4.0 - 10.5 K/uL   RBC 3.05 (*) 4.22 - 5.81 MIL/uL   Hemoglobin 8.7 (*) 13.0 - 17.0 g/dL   HCT 27.9 (*) 39.0 - 52.0 %   MCV 91.5  78.0 - 100.0 fL   MCH 28.5  26.0 - 34.0 pg   MCHC 31.2  30.0 - 36.0 g/dL   RDW 15.5  11.5 - 15.5 %   Platelets 287  150 - 400 K/uL  COMPREHENSIVE METABOLIC  PANEL     Status: Abnormal   Collection Time    12/20/13 12:48 PM      Result Value Ref Range   Sodium 138  137 - 147 mEq/L   Potassium 4.9  3.7 - 5.3 mEq/L   Chloride 105  96 - 112 mEq/L   CO2 21  19 - 32 mEq/L   Glucose, Bld 222 (*) 70 - 99 mg/dL   BUN 34 (*) 6 - 23 mg/dL   Creatinine, Ser 1.21  0.50 - 1.35 mg/dL   Calcium 9.7  8.4 - 10.5 mg/dL   Total Protein 7.0  6.0 - 8.3 g/dL   Albumin 2.3 (*) 3.5 - 5.2 g/dL   AST 10  0 - 37 U/L   ALT <5  0 - 53 U/L   Comment: REPEATED TO VERIFY   Alkaline Phosphatase 115  39 - 117 U/L   Total Bilirubin <0.2 (*) 0.3 - 1.2 mg/dL   GFR calc non Af Amer 52 (*) >90 mL/min   GFR calc Af Amer 60 (*) >90 mL/min   Comment: (NOTE)     The eGFR has been calculated using the CKD EPI equation.     This calculation has not been validated in all clinical situations.     eGFR's persistently <90 mL/min signify possible Chronic Kidney     Disease.  CULTURE, BLOOD (ROUTINE X 2)     Status:  None   Collection Time    12/20/13 12:48 PM      Result Value Ref Range   Specimen Description BLOOD RIGHT ANTECUBITAL     Special Requests BOTTLES DRAWN AEROBIC AND ANAEROBIC 5ML     Culture  Setup Time       Value: 12/20/2013 16:03     Performed at [facility]   Culture       Value:        BLOOD CULTURE RECEIVED NO GROWTH TO DATE CULTURE WILL BE HELD FOR 5 DAYS BEFORE ISSUING A FINAL NEGATIVE REPORT     Performed at [facility]   Report Status PENDING    CULTURE, BLOOD (ROUTINE X 2)     Status: None   Collection Time    12/20/13 12:48 PM      Result Value Ref Range   Specimen Description BLOOD LEFT ANTECUBITAL     Special Requests BOTTLES DRAWN AEROBIC AND ANAEROBIC 5ML     Culture  Setup Time       Value: 12/20/2013 16:02     Performed at [facility]   Culture       Value:        BLOOD CULTURE RECEIVED NO GROWTH TO DATE CULTURE WILL BE HELD FOR 5 DAYS BEFORE ISSUING A FINAL NEGATIVE REPORT     Performed at [facility]   Report Status PENDING    HEMOGLOBIN A1C     Status: Abnormal   Collection Time    12/20/13 12:48 PM      Result Value Ref Range   Hemoglobin A1C 8.0 (*) <5.7 %   Comment: (NOTE)  According to the ADA Clinical Practice Recommendations for 2011, when     HbA1c is used as a screening test:      >=6.5%   Diagnostic of Diabetes Mellitus               (if abnormal result is confirmed)     5.7-6.4%   Increased risk of developing Diabetes Mellitus     References:Diagnosis and Classification of Diabetes Mellitus,Diabetes     PQZR,0076,22(QJFHL 1):S62-S69 and Standards of Medical Care in             Diabetes - 2011,Diabetes KTGY,5638,93 (Suppl 1):S11-S61.   Mean Plasma Glucose 183 (*) <117 mg/dL   Comment: Performed at [facility], ED     Status: None   Collection Time    12/20/13  1:02 PM      Result Value Ref Range    Lactic Acid, Venous 1.66  0.5 - 2.2 mmol/L  WOUND CULTURE     Status: None   Collection Time    12/20/13  2:42 PM      Result Value Ref Range   Specimen Description WOUND     Special Requests Normal     Gram Stain       Value: NO WBC SEEN     NO SQUAMOUS EPITHELIAL CELLS SEEN     RARE GRAM POSITIVE COCCI IN PAIRS     RARE GRAM NEGATIVE RODS     Performed at [facility]   Culture       Value: Culture reincubated for better growth     Performed at [facility]   Report Status PENDING    URINALYSIS, ROUTINE W REFLEX MICROSCOPIC     Status: Abnormal   Collection Time    12/20/13  3:20 PM      Result Value Ref Range   Color, Urine YELLOW  YELLOW   APPearance CLOUDY (*) CLEAR   Specific Gravity, Urine 1.015  1.005 - 1.030   pH 5.0  5.0 - 8.0   Glucose, UA NEGATIVE  NEGATIVE mg/dL   Hgb urine dipstick LARGE (*) NEGATIVE   Bilirubin Urine NEGATIVE  NEGATIVE   Ketones, ur NEGATIVE  NEGATIVE mg/dL   Protein, ur 30 (*) NEGATIVE mg/dL   Urobilinogen, UA 0.2  0.0 - 1.0 mg/dL   Nitrite NEGATIVE  NEGATIVE   Leukocytes, UA SMALL (*) NEGATIVE  CLOSTRIDIUM DIFFICILE BY PCR     Status: None   Collection Time    12/20/13  3:20 PM      Result Value Ref Range   C difficile by pcr NEGATIVE  NEGATIVE   Comment: Performed at [facility]     Status: Abnormal   Collection Time    12/20/13  3:20 PM      Result Value Ref Range   Squamous Epithelial / LPF RARE  RARE   WBC, UA 3-6  <3 WBC/hpf   RBC / HPF TOO NUMEROUS TO COUNT  <3 RBC/hpf   Bacteria, UA RARE  RARE   Casts HYALINE CASTS (*) NEGATIVE   Urine-Other MUCOUS PRESENT    GLUCOSE, CAPILLARY     Status: Abnormal   Collection Time    12/20/13  5:17 PM      Result Value Ref Range   Glucose-Capillary 129 (*) 70 - 99 mg/dL  GLUCOSE, CAPILLARY     Status: Abnormal   Collection Time    12/20/13  7:35  PM      Result Value Ref Range   Glucose-Capillary 153 (*) 70 - 99 mg/dL  GLUCOSE,  CAPILLARY     Status: Abnormal   Collection Time    12/20/13 10:11 PM      Result Value Ref Range   Glucose-Capillary 198 (*) 70 - 99 mg/dL  BASIC METABOLIC PANEL     Status: Abnormal   Collection Time    12/21/13  4:40 AM      Result Value Ref Range   Sodium 137  137 - 147 mEq/L   Potassium 4.7  3.7 - 5.3 mEq/L   Chloride 106  96 - 112 mEq/L   CO2 19  19 - 32 mEq/L   Glucose, Bld 123 (*) 70 - 99 mg/dL   BUN 30 (*) 6 - 23 mg/dL   Creatinine, Ser 1.13  0.50 - 1.35 mg/dL   Calcium 9.2  8.4 - 10.5 mg/dL   GFR calc non Af Amer 56 (*) >90 mL/min   GFR calc Af Amer 65 (*) >90 mL/min   Comment: (NOTE)     The eGFR has been calculated using the CKD EPI equation.     This calculation has not been validated in all clinical situations.     eGFR's persistently <90 mL/min signify possible Chronic Kidney     Disease.  CBC     Status: Abnormal   Collection Time    12/21/13  4:40 AM      Result Value Ref Range   WBC 12.2 (*) 4.0 - 10.5 K/uL   RBC 2.76 (*) 4.22 - 5.81 MIL/uL   Hemoglobin 7.6 (*) 13.0 - 17.0 g/dL   HCT 25.5 (*) 39.0 - 52.0 %   MCV 92.4  78.0 - 100.0 fL   MCH 27.5  26.0 - 34.0 pg   MCHC 29.8 (*) 30.0 - 36.0 g/dL   RDW 15.8 (*) 11.5 - 15.5 %   Platelets 262  150 - 400 K/uL  GLUCOSE, CAPILLARY     Status: None   Collection Time    12/21/13  7:26 AM      Result Value Ref Range   Glucose-Capillary 77  70 - 99 mg/dL  VITAMIN B12     Status: Abnormal   Collection Time    12/21/13 11:30 AM      Result Value Ref Range   Vitamin B-12 1227 (*) 211 - 911 pg/mL   Comment: Performed at [place]     Status: None   Collection Time    12/21/13 11:30 AM      Result Value Ref Range   Folate >20.0     Comment: (NOTE)     Reference Ranges            Deficient:       0.4 - 3.3 ng/mL            Indeterminate:   3.4 - 5.4 ng/mL            Normal:              > 5.4 ng/mL     Performed at [facility]     Status: None   Collection Time     12/21/13 11:30 AM      Result Value Ref Range   Ferritin 290  22 - 322 ng/mL   Comment: Performed at [facility]     Status: Abnormal   Collection  Time    2013-12-31 11:30 AM      Result Value Ref Range   Retic Ct Pct 3.5 (*) 0.4 - 3.1 %   RBC. 2.82 (*) 4.22 - 5.81 MIL/uL   Retic Count, Manual 98.7  19.0 - 186.0 K/uL  TROPONIN I     Status: None   Collection Time    31-Dec-2013 11:30 AM      Result Value Ref Range   Troponin I <0.30  <0.30 ng/mL   Comment:            Due to the release kinetics of cTnI,     a negative result within the first hours     of the onset of symptoms does not rule out     myocardial infarction with certainty.     If myocardial infarction is still suspected,     repeat the test at appropriate intervals.  GLUCOSE, CAPILLARY     Status: Abnormal   Collection Time    12/31/13 11:42 AM      Result Value Ref Range   Glucose-Capillary 114 (*) 70 - 99 mg/dL  GLUCOSE, CAPILLARY     Status: None   Collection Time    12/31/13  5:25 PM      Result Value Ref Range   Glucose-Capillary 92  70 - 99 mg/dL    Dg Chest Port 1 View  12/20/2013   CLINICAL DATA:  Fever, atrial fibrillation  EXAM: PORTABLE CHEST - 1 VIEW  COMPARISON:  12/14/2013  FINDINGS: Background hyperinflation compatible with COPD/ emphysema. Similar small pleural effusions persist with compressive basilar atelectasis. No significant interval change. Upper lobes remain clear. No pneumothorax. Trachea is midline.  IMPRESSION: COPD/ emphysema  Similar small effusions with basilar compressive atelectasis   Electronically Signed   By: Daryll Brod M.D.   On: 12/20/2013 15:56    ROS:  As above. PE:  Blood pressure 126/44, pulse 83, temperature 98.3 F (36.8 C), temperature source Oral, resp. rate 16, height 6' 1"  (1.854 m), weight 102.4 kg (225 lb 12 oz), SpO2 100.00%. wn elderly male in nad.  A and O x 1.  EOMi.  Resp unlabored.  R LE with a 10 cm x 3 cm x 2 cm wound with necrotic  lateral compartment musculature and exposed fibula.  No palpable pulses in the foot.  Sens to LT diminshed at the leg and foot.  5/5 strength in PF and DF of the ankle.  No lymphadenopathy.  No purulence noted but there is a foul smell from the wound.  Assessment/Plan: Right leg nonhealing ulcer with fibular osteomyelitis - I explained the nature of the wound to the patient's daughter in detail.  I don't believe he has a realistic chance of healing the wound after the extensive I and D that would be required to remove the necrotic and infected tissue.  As a result I believe bka is the best alternative to definitively treat this infected limb.    The risks and benefits of the alternative treatment options have been discussed in detail.  The patient's daughter wishes to proceed with surgery and specifically understands risks of bleeding, infection, nerve damage, blood clots, need for additional surgery, amputation and death.   Wylene Simmer 12/31/13, 7:01 PM

## 2013-12-21 NOTE — Care Management Note (Signed)
    Page 1 of 1   12/21/2013     11:35:22 AM   CARE MANAGEMENT NOTE 12/21/2013  Patient:  Jesse Chandler, Jesse Chandler   Account Number:  [id number]  Date Initiated:  12/21/2013  Documentation initiated by:  Sunday Spillers  Subjective/Objective Assessment:   80 yo male admitted with LE wounds, question osteo. PTA lived at [facility]     Action/Plan:   Return to SNF at d/c   Anticipated DC Date:  12/25/2013   Anticipated DC Plan:  [place]  In-house referral  Clinical Social Worker      DC Planning Services  CM consult      Choice offered to / List presented to:             Status of service:  Completed, signed off Medicare Important Message given?  NA - LOS <3 / Initial given by admissions (If response is "NO", the following Medicare IM given date fields will be blank) Date Medicare IM given:   Date Additional Medicare IM given:    Discharge Disposition:  [place]  Per UR Regulation:  Reviewed for med. necessity/level of care/duration of stay  If discussed at [place] of Stay Meetings, dates discussed:    Comments:

## 2013-12-21 NOTE — Progress Notes (Addendum)
INFECTIOUS DISEASE PROGRESS NOTE  ID: Jesse Chandler is a 80 y.o. male with  Principal Problem:   Infected pressure ulcer Active Problems:   Paroxysmal a-fib   Hypothyroidism   Acute kidney injury   Diabetic foot ulcer   S/P unilateral L. BKA    Diabetic ulcer of ankle  Subjective: Resting quietly.  Abtx:  Anti-infectives   Start     Dose/Rate Route Frequency Ordered Stop   12/21/13 1600  vancomycin (VANCOCIN) 1,500 mg in sodium chloride 0.9 % 500 mL IVPB     1,500 mg 250 mL/hr over 120 Minutes Intravenous Every 24 hours 12/20/13 1747     12/21/13 0200  meropenem (MERREM) 1 g in sodium chloride 0.9 % 100 mL IVPB     1 g 200 mL/hr over 30 Minutes Intravenous Every 8 hours 12/20/13 1747     12/20/13 1715  meropenem (MERREM) 1 g in sodium chloride 0.9 % 100 mL IVPB     1 g 200 mL/hr over 30 Minutes Intravenous NOW 12/20/13 1711 12/20/13 1840   12/20/13 1445  vancomycin (VANCOCIN) 2,000 mg in sodium chloride 0.9 % 500 mL IVPB     2,000 mg 250 mL/hr over 120 Minutes Intravenous  Once 12/20/13 1427 12/20/13 1805      Medications:  Scheduled: . B-complex with vitamin C  1 tablet Oral Daily  . calcium-vitamin D  1 tablet Oral BID  . diltiazem  240 mg Oral Daily  . enzalutamide  160 mg Oral QPC supper  . feeding supplement (PRO-STAT SUGAR FREE 64)  30 mL Oral Daily  . ferrous fumarate  1 tablet Oral BID  . folic acid  1 mg Oral Daily  . furosemide  60 mg Oral BID  . guaiFENesin  1,200 mg Oral BID  . heparin  5,000 Units Subcutaneous 3 times per day  . influenza vac split quadrivalent PF  0.5 mL Intramuscular Tomorrow-1000  . insulin aspart  0-15 Units Subcutaneous TID WC  . insulin aspart  0-5 Units Subcutaneous QHS  . insulin glargine  10 Units Subcutaneous Daily  . levothyroxine  50 mcg Oral QAC breakfast  . meropenem (MERREM) IV  1 g Intravenous Q8H  . multivitamin with minerals  1 tablet Oral Daily  . pantoprazole  40 mg Oral Daily  . pneumococcal 23 valent  vaccine  0.5 mL Intramuscular Tomorrow-1000  . polyethylene glycol  17 g Oral Daily  . potassium chloride SA  40 mEq Oral BID  . saccharomyces boulardii  250 mg Oral BID  . vancomycin  1,500 mg Intravenous Q24H  . vitamin C  500 mg Oral Daily    Objective: Vital signs in last 24 hours: Temp:  [97.6 F (36.4 C)-98 F (36.7 C)] 98 F (36.7 C) (03/26 0507) Pulse Rate:  [64-90] 64 (03/26 0507) Resp:  [14-16] 16 (03/26 0507) BP: (108-138)/(39-82) 112/55 mmHg (03/26 0507) SpO2:  [97 %-100 %] 100 % (03/26 0507) Weight:  [101.3 kg (223 lb 5.2 oz)-102.4 kg (225 lb 12 oz)] 102.4 kg (225 lb 12 oz) (03/26 0507)   General appearance: no distress Extremities: RLE wrapped  Lab Results  Recent Labs  12/20/13 1248 12/21/13 0440  WBC 17.1* 12.2*  HGB 8.7* 7.6*  HCT 27.9* 25.5*  NA 138 137  K 4.9 4.7  CL 105 106  CO2 21 19  BUN 34* 30*  CREATININE 1.21 1.13   Liver Panel  Recent Labs  12/20/13 1248  PROT 7.0  ALBUMIN 2.3*  AST 10  ALT <5  ALKPHOS 115  BILITOT <0.2*   Sedimentation Rate No results found for this basename: ESRSEDRATE,  in the last 72 hours C-Reactive Protein No results found for this basename: CRP,  in the last 72 hours  Microbiology: Recent Results (from the past 240 hour(s))  CULTURE, BLOOD (ROUTINE X 2)     Status: None   Collection Time    12/20/13 12:48 PM      Result Value Ref Range Status   Specimen Description BLOOD RIGHT ANTECUBITAL   Final   Special Requests BOTTLES DRAWN AEROBIC AND ANAEROBIC 5ML   Final   Culture  Setup Time     Final   Value: 12/20/2013 16:03     Performed at [facility]   Culture     Final   Value:        BLOOD CULTURE RECEIVED NO GROWTH TO DATE CULTURE WILL BE HELD FOR 5 DAYS BEFORE ISSUING A FINAL NEGATIVE REPORT     Performed at [facility]   Report Status PENDING   Incomplete  CULTURE, BLOOD (ROUTINE X 2)     Status: None   Collection Time    12/20/13 12:48 PM      Result Value Ref Range Status    Specimen Description BLOOD LEFT ANTECUBITAL   Final   Special Requests BOTTLES DRAWN AEROBIC AND ANAEROBIC 5ML   Final   Culture  Setup Time     Final   Value: 12/20/2013 16:02     Performed at [facility]   Culture     Final   Value:        BLOOD CULTURE RECEIVED NO GROWTH TO DATE CULTURE WILL BE HELD FOR 5 DAYS BEFORE ISSUING A FINAL NEGATIVE REPORT     Performed at [facility]   Report Status PENDING   Incomplete  WOUND CULTURE     Status: None   Collection Time    12/20/13  2:42 PM      Result Value Ref Range Status   Specimen Description WOUND   Final   Special Requests Normal   Final   Gram Stain     Final   Value: NO WBC SEEN     NO SQUAMOUS EPITHELIAL CELLS SEEN     RARE GRAM POSITIVE COCCI IN PAIRS     RARE GRAM NEGATIVE RODS     Performed at [facility]   Culture     Final   Value: Culture reincubated for better growth     Performed at [facility]   Report Status PENDING   Incomplete  CLOSTRIDIUM DIFFICILE BY PCR     Status: None   Collection Time    12/20/13  3:20 PM      Result Value Ref Range Status   C difficile by pcr NEGATIVE  NEGATIVE Final   Comment: Performed at [facility]    Studies/Results: Dg Chest Port 1 View  12/20/2013   CLINICAL DATA:  Fever, atrial fibrillation  EXAM: PORTABLE CHEST - 1 VIEW  COMPARISON:  12/14/2013  FINDINGS: Background hyperinflation compatible with COPD/ emphysema. Similar small pleural effusions persist with compressive basilar atelectasis. No significant interval change. Upper lobes remain clear. No pneumothorax. Trachea is midline.  IMPRESSION: COPD/ emphysema  Similar small effusions with basilar compressive atelectasis   Electronically Signed   By: Daryll Brod M.D.   On: 12/20/2013 15:56     Assessment/Plan: Infected Decubitus Ulcer  DM  Metastatic Prostate Disease  Severe protein calorie malnutrition  Total days of antibiotics: day 1 vanco/imipenem  Would continue  anbx Wbc better Await surgical opinion.  nutrition Wound care PIC if not placed already         Bobby Rumpf Infectious Diseases (pager) [phone] [email or web address] 12/21/2013, 10:43 AM  LOS: 1 day

## 2013-12-22 ENCOUNTER — Inpatient Hospital Stay (HOSPITAL_COMMUNITY): Payer: PRIVATE HEALTH INSURANCE | Admitting: Critical Care Medicine

## 2013-12-22 ENCOUNTER — Encounter (HOSPITAL_COMMUNITY)
Admission: EM | Disposition: A | Discharge status: Skilled Nursing Facility | Payer: Self-pay | Source: Home / Self Care | Attending: Internal Medicine

## 2013-12-22 ENCOUNTER — Encounter (HOSPITAL_COMMUNITY): Payer: Self-pay | Admitting: Critical Care Medicine

## 2013-12-22 ENCOUNTER — Encounter (HOSPITAL_COMMUNITY): Payer: PRIVATE HEALTH INSURANCE | Admitting: Critical Care Medicine

## 2013-12-22 DIAGNOSIS — M869 Osteomyelitis, unspecified: Secondary | ICD-10-CM

## 2013-12-22 HISTORY — PX: AMPUTATION: SHX166

## 2013-12-22 LAB — TROPONIN I: Troponin I: <0.3 ng/mL (ref <0.30)

## 2013-12-22 LAB — URINE CULTURE
Colony Count: NO GROWTH
Culture: NO GROWTH

## 2013-12-22 LAB — SURGICAL PCR SCREEN
MRSA, PCR: NEGATIVE
STAPHYLOCOCCUS AUREUS: NEGATIVE

## 2013-12-22 LAB — GLUCOSE, CAPILLARY
GLUCOSE-CAPILLARY: 103 mg/dL — AB (ref 70–99)
GLUCOSE-CAPILLARY: 89 mg/dL (ref 70–99)
Glucose-Capillary: 82 mg/dL (ref 70–99)
Glucose-Capillary: 94 mg/dL (ref 70–99)
Glucose-Capillary: 93 mg/dL (ref 70–99)
Glucose-Capillary: 120 mg/dL — ABNORMAL HIGH (ref 70–99)

## 2013-12-22 LAB — IRON AND TIBC
Iron: 50 ug/dL (ref 42–135)
SATURATION RATIOS: 28 % (ref 20–55)
TIBC: 176 ug/dL — ABNORMAL LOW (ref 215–435)
UIBC: 126 ug/dL (ref 125–400)

## 2013-12-22 LAB — NO BLOOD PRODUCTS

## 2013-12-22 SURGERY — AMPUTATION BELOW KNEE
Anesthesia: General | Site: Leg Lower | Laterality: Right

## 2013-12-22 MED ORDER — 0.9 % SODIUM CHLORIDE (POUR BTL) OPTIME
TOPICAL | Status: DC | PRN
  Administered 2013-12-22: 1000 mL

## 2013-12-22 MED ORDER — PROPOFOL 10 MG/ML IV BOLUS
INTRAVENOUS | Status: AC
  Filled 2013-12-22: qty 20

## 2013-12-22 MED ORDER — ETOMIDATE 2 MG/ML IV SOLN
INTRAVENOUS | Status: DC | PRN
  Administered 2013-12-22: 14 mg via INTRAVENOUS

## 2013-12-22 MED ORDER — PHENYLEPHRINE HCL 10 MG/ML IJ SOLN
INTRAMUSCULAR | Status: DC | PRN
  Administered 2013-12-22 (×4): 80 ug via INTRAVENOUS
  Administered 2013-12-22 (×2): 40 ug via INTRAVENOUS
  Administered 2013-12-22: 80 ug via INTRAVENOUS
  Administered 2013-12-22: 40 ug via INTRAVENOUS
  Administered 2013-12-22 (×3): 80 ug via INTRAVENOUS

## 2013-12-22 MED ORDER — LACTATED RINGERS IV SOLN
INTRAVENOUS | Status: DC
  Administered 2013-12-22: 16:00:00 via INTRAVENOUS

## 2013-12-22 MED ORDER — EPHEDRINE SULFATE 50 MG/ML IJ SOLN
INTRAMUSCULAR | Status: DC | PRN
  Administered 2013-12-22: 10 mg via INTRAVENOUS

## 2013-12-22 MED ORDER — FERUMOXYTOL INJECTION 510 MG/17 ML
510.0000 mg | Freq: Once | INTRAVENOUS | Status: AC
  Administered 2013-12-23: 510 mg via INTRAVENOUS
  Filled 2013-12-22: qty 17

## 2013-12-22 MED ORDER — BACITRACIN ZINC 500 UNIT/GM EX OINT
TOPICAL_OINTMENT | CUTANEOUS | Status: DC | PRN
  Administered 2013-12-22: 1 via TOPICAL

## 2013-12-22 MED ORDER — DEXTROSE-NACL 5-0.45 % IV SOLN
INTRAVENOUS | Status: DC
  Administered 2013-12-22: 75 mL/h via INTRAVENOUS
  Administered 2013-12-23: 22:00:00 via INTRAVENOUS

## 2013-12-22 MED ORDER — LIDOCAINE HCL (CARDIAC) 20 MG/ML IV SOLN
INTRAVENOUS | Status: DC | PRN
  Administered 2013-12-22: 70 mg via INTRAVENOUS

## 2013-12-22 MED ORDER — SUCCINYLCHOLINE CHLORIDE 20 MG/ML IJ SOLN
INTRAMUSCULAR | Status: DC | PRN
  Administered 2013-12-22: 100 mg via INTRAVENOUS

## 2013-12-22 MED ORDER — ARTIFICIAL TEARS OP OINT
TOPICAL_OINTMENT | OPHTHALMIC | Status: AC
  Filled 2013-12-22: qty 3.5

## 2013-12-22 MED ORDER — ONDANSETRON HCL 4 MG/2ML IJ SOLN
INTRAMUSCULAR | Status: DC | PRN
  Administered 2013-12-22: 4 mg via INTRAVENOUS

## 2013-12-22 MED ORDER — FENTANYL CITRATE 0.05 MG/ML IJ SOLN
INTRAMUSCULAR | Status: AC
  Filled 2013-12-22: qty 5

## 2013-12-22 MED ORDER — BACITRACIN ZINC 500 UNIT/GM EX OINT
TOPICAL_OINTMENT | CUTANEOUS | Status: AC
  Filled 2013-12-22: qty 15

## 2013-12-22 MED ORDER — FENTANYL CITRATE 0.05 MG/ML IJ SOLN
INTRAMUSCULAR | Status: DC | PRN
  Administered 2013-12-22 (×2): 50 ug via INTRAVENOUS

## 2013-12-22 MED ORDER — ENOXAPARIN SODIUM 40 MG/0.4ML ~~LOC~~ SOLN
40.0000 mg | SUBCUTANEOUS | Status: DC
  Filled 2013-12-22: qty 0.4

## 2013-12-22 SURGICAL SUPPLY — 61 items
APPLIER CLIP 9.375 MED OPEN (MISCELLANEOUS) ×3
APR CLP MED 9.3 20 MLT OPN (MISCELLANEOUS) ×1
BANDAGE ELASTIC 6 VELCRO ST LF (GAUZE/BANDAGES/DRESSINGS) ×2 IMPLANT
BANDAGE ESMARK 6X9 LF (GAUZE/BANDAGES/DRESSINGS) IMPLANT
BLADE SAW RECIP 87.9 MT (BLADE) ×3 IMPLANT
BLADE SURG 10 STRL SS (BLADE) ×2 IMPLANT
BNDG CMPR 9X6 STRL LF SNTH (GAUZE/BANDAGES/DRESSINGS)
BNDG COHESIVE 6X5 TAN STRL LF (GAUZE/BANDAGES/DRESSINGS) ×6 IMPLANT
BNDG ESMARK 6X9 LF (GAUZE/BANDAGES/DRESSINGS)
CANISTER SUCT 3000ML (MISCELLANEOUS) ×3 IMPLANT
CHLORAPREP W/TINT 26ML (MISCELLANEOUS) ×3 IMPLANT
CLIP APPLIE 9.375 MED OPEN (MISCELLANEOUS) IMPLANT
COVER SURGICAL LIGHT HANDLE (MISCELLANEOUS) ×3 IMPLANT
CUFF TOURNIQUET SINGLE 34IN LL (TOURNIQUET CUFF) ×2 IMPLANT
CUFF TOURNIQUET SINGLE 44IN (TOURNIQUET CUFF) IMPLANT
DRAPE EXTREMITY T 121X128X90 (DRAPE) ×3 IMPLANT
DRAPE INCISE IOBAN 66X45 STRL (DRAPES) ×3 IMPLANT
DRAPE PROXIMA HALF (DRAPES) ×6 IMPLANT
DRAPE U-SHAPE 47X51 STRL (DRAPES) ×6 IMPLANT
DRSG MEPITEL 4X7.2 (GAUZE/BANDAGES/DRESSINGS) ×3 IMPLANT
DRSG PAD ABDOMINAL 8X10 ST (GAUZE/BANDAGES/DRESSINGS) ×5 IMPLANT
ELECT CAUTERY BLADE 6.4 (BLADE) ×2 IMPLANT
ELECT REM PT RETURN 9FT ADLT (ELECTROSURGICAL) ×3
ELECTRODE REM PT RTRN 9FT ADLT (ELECTROSURGICAL) ×1 IMPLANT
EVACUATOR 1/8 PVC DRAIN (DRAIN) IMPLANT
GLOVE BIO SURGEON STRL SZ7 (GLOVE) ×3 IMPLANT
GLOVE BIO SURGEON STRL SZ8 (GLOVE) ×3 IMPLANT
GLOVE BIOGEL PI IND STRL 7.5 (GLOVE) ×1 IMPLANT
GLOVE BIOGEL PI IND STRL 8 (GLOVE) ×1 IMPLANT
GLOVE BIOGEL PI INDICATOR 7.5 (GLOVE) ×2
GLOVE BIOGEL PI INDICATOR 8 (GLOVE) ×2
GOWN STRL REUS W/ TWL LRG LVL3 (GOWN DISPOSABLE) ×1 IMPLANT
GOWN STRL REUS W/ TWL XL LVL3 (GOWN DISPOSABLE) ×1 IMPLANT
GOWN STRL REUS W/TWL LRG LVL3 (GOWN DISPOSABLE) ×3
GOWN STRL REUS W/TWL XL LVL3 (GOWN DISPOSABLE) ×3
IMMOBILIZER KNEE 22 (SOFTGOODS) ×2 IMPLANT
KIT BASIN OR (CUSTOM PROCEDURE TRAY) ×3 IMPLANT
KIT ROOM TURNOVER OR (KITS) ×3 IMPLANT
NS IRRIG 1000ML POUR BTL (IV SOLUTION) ×3 IMPLANT
PACK GENERAL/GYN (CUSTOM PROCEDURE TRAY) ×3 IMPLANT
PAD ARMBOARD 7.5X6 YLW CONV (MISCELLANEOUS) ×6 IMPLANT
PAD CAST 4YDX4 CTTN HI CHSV (CAST SUPPLIES) ×1 IMPLANT
PADDING CAST COTTON 4X4 STRL (CAST SUPPLIES) ×3
PADDING CAST COTTON 6X4 STRL (CAST SUPPLIES) ×3 IMPLANT
SPONGE GAUZE 4X4 12PLY (GAUZE/BANDAGES/DRESSINGS) ×3 IMPLANT
SPONGE LAP 18X18 X RAY DECT (DISPOSABLE) IMPLANT
STAPLER VISISTAT 35W (STAPLE) ×2 IMPLANT
STOCKINETTE IMPERVIOUS LG (DRAPES) ×2 IMPLANT
SUT ETHILON 2 0 FS 18 (SUTURE) ×8 IMPLANT
SUT MNCRL AB 3-0 PS2 18 (SUTURE) ×9 IMPLANT
SUT PDS 2 0 (SUTURE) ×2 IMPLANT
SUT PDS AB 0 CT 36 (SUTURE) ×5 IMPLANT
SUT PROLENE 3 0 PS 2 (SUTURE) ×3 IMPLANT
SUT PROLENE 3 0 SH 48 (SUTURE) IMPLANT
SUT SILK 2 0 (SUTURE) ×3
SUT SILK 2-0 18XBRD TIE 12 (SUTURE) ×1 IMPLANT
SWAB COLLECTION DEVICE MRSA (MISCELLANEOUS) IMPLANT
TOWEL OR 17X24 6PK STRL BLUE (TOWEL DISPOSABLE) ×3 IMPLANT
TOWEL OR 17X26 10 PK STRL BLUE (TOWEL DISPOSABLE) ×3 IMPLANT
TUBE ANAEROBIC SPECIMEN COL (MISCELLANEOUS) IMPLANT
WATER STERILE IRR 1000ML POUR (IV SOLUTION) ×1 IMPLANT

## 2013-12-22 NOTE — Preoperative (Signed)
Beta Blockers   Reason not to administer Beta Blockers:Not Applicable 

## 2013-12-22 NOTE — Progress Notes (Signed)
Patient's daughter has signed consent form.  Patient and patient's daughter has signed refusal of blood products d/t religious beliefs.  Copy sent to blood bank/lab per their request.  Please see hard copy chart.

## 2013-12-22 NOTE — Transfer of Care (Signed)
Immediate Anesthesia Transfer of Care Note  Patient: Jesse Chandler  Procedure(s) Performed: Procedure(s) with comments: AMPUTATION BELOW KNEE (Right) - right below knee amputation  Patient Location: PACU  Anesthesia Type:General  Level of Consciousness: awake, alert  and oriented  Airway & Oxygen Therapy: Patient Spontanous Breathing and Patient connected to nasal cannula oxygen  Post-op Assessment: Report given to PACU RN and Post -op Vital signs reviewed and stable  Post vital signs: Reviewed and stable  Complications: No apparent anesthesia complications

## 2013-12-22 NOTE — Consult Note (Addendum)
WOC wound consult note Reason for Consult: Consult requested for right leg and sacral wound.  Pt is now followed by ortho service for assessment and plan of care to right leg and plans to go to the OR today.  Please refer to this team for further questions. Wound type: Sacrum with stage 3 wound; 3.5X1X.2cm. Stage 2 wound near sacrum is 2X1X.1cm. Both sites 100% pink and moist.  No odor, small amt yellow drainage. Surrounded by pink dry scar tissue which has healed to wound edges. Pressure Ulcer POA: Yes Inner thigh/groin area with full thickness wound of unknown etiology.  10X1X.2cm.  85% red, 15% yellow.  Small amt yellow drainage, no odor.   Dressing procedure/placement/frequency: Foam dressing to inner groin and sacrum wounds to absorb drainage and promote healing.  Difficult to keep from soiling; pt currently incontinent of stool and has multiple systemic factors which might impair healing. Please re-consult if further assistance is needed.  Thank-you,  Julien Girt MSN, [place], [place], [place], [place]

## 2013-12-22 NOTE — Brief Op Note (Signed)
12/20/2013 - 12/22/2013  6:04 PM  PATIENT:  Jesse Chandler  80 y.o. male  PRE-OPERATIVE DIAGNOSIS:  Non healing Right leg diabetic wound  POST-OPERATIVE DIAGNOSIS:  Same  Procedure(s): Right below knee amputation  SURGEON:  Wylene Simmer, MD  ASSISTANT: n/a  ANESTHESIA:   General  EBL:  500 cc   TOURNIQUET:   Total Tourniquet Time Documented: Thigh (Right) - 2 minutes Total: Thigh (Right) - 2 minutes   COMPLICATIONS:  None apparent  DISPOSITION:  Extubated, awake and stable to recovery.  DICTATION ID:  [id number]

## 2013-12-22 NOTE — Progress Notes (Signed)
Per Dr Eliseo Squires, hold all medications except beta blockers and Lasix before surgical procedure today @ 4:30pm.  Patient could have breakfast, NPO after.  Patient last solid and liquid was at 9:30am.

## 2013-12-22 NOTE — Progress Notes (Signed)
Pre-Op report called to Pam in OR.

## 2013-12-22 NOTE — Progress Notes (Signed)
Patient is a Sales promotion account executive Witness- will give IV Fe and check Hgb in AM- may need Procrit as well  Eulogio Bear DO

## 2013-12-22 NOTE — Anesthesia Preprocedure Evaluation (Addendum)
Anesthesia Evaluation  Patient identified by MRN, date of birth, ID band Patient awake    Reviewed: Allergy & Precautions, H&P , NPO status , Patient's Chart, lab work & pertinent test results  History of Anesthesia Complications Negative for: history of anesthetic complications  Airway Mallampati: II TM Distance: >3 FB Neck ROM: Full    Dental  (+) Dental Advisory Given, Poor Dentition   Pulmonary shortness of breath, neg sleep apnea, neg COPDformer smoker,   Crackles b/l        Cardiovascular hypertension, Pt. on medications +CHF + dysrhythmias Atrial Fibrillation Rhythm:Regular Rate:Normal     Neuro/Psych PSYCHIATRIC DISORDERS H/o subdural s/p crani  Neuromuscular disease    GI/Hepatic Neg liver ROS, GERD-  ,  Endo/Other  diabetes, Type 2, Insulin DependentHypothyroidism   Renal/GU Renal InsufficiencyRenal disease     Musculoskeletal   Abdominal   Peds  Hematology  (+) Blood dyscrasia, anemia ,   Anesthesia Other Findings NPO 7 hours  Reproductive/Obstetrics                       Anesthesia Physical Anesthesia Plan  ASA: III  Anesthesia Plan: General   Post-op Pain Management:    Induction: Intravenous  Airway Management Planned: Oral ETT  Additional Equipment: None  Intra-op Plan:   Post-operative Plan: Extubation in OR  Informed Consent: I have reviewed the patients History and Physical, chart, labs and discussed the procedure including the risks, benefits and alternatives for the proposed anesthesia with the patient or authorized representative who has indicated his/her understanding and acceptance.     Plan Discussed with: CRNA and Surgeon  Anesthesia Plan Comments:         Anesthesia Quick Evaluation

## 2013-12-22 NOTE — Progress Notes (Signed)
Subjective: Pt feeling well this AM. Presents sitting reclined in bed at rest. Pt states he is in no pain.  Pt denies N/V/F/C, chest pain, SOB, or change in appetite.  Objective: Vital signs in last 24 hours: Temp:  [97.8 F (36.6 C)-98.6 F (37 C)] 98.2 F (36.8 C) (03/27 0548) Pulse Rate:  [76-83] 78 (03/27 0548) Resp:  [16] 16 (03/27 0548) BP: (120-131)/(44-58) 124/58 mmHg (03/27 0548) SpO2:  [97 %-100 %] 97 % (03/27 0548)  Intake/Output from previous day: 03/26 0701 - 03/27 0700 In: 1755 [P.O.:1080; I.V.:500; IV Piggyback:100] Out: 1550 [Urine:1550] Intake/Output this shift:     Recent Labs  12/20/13 1248 12/21/13 0440  HGB 8.7* 7.6*    Recent Labs  12/20/13 1248 12/21/13 0440 12/21/13 1130  WBC 17.1* 12.2*  --   RBC 3.05* 2.76* 2.82*  HCT 27.9* 25.5*  --   PLT 287 262  --     Recent Labs  12/20/13 1248 12/21/13 0440  NA 138 137  K 4.9 4.7  CL 105 106  CO2 21 19  BUN 34* 30*  CREATININE 1.21 1.13  GLUCOSE 222* 123*  CALCIUM 9.7 9.2   No results found for this basename: LABPT, INR,  in the last 72 hours  WD WN 80 y/o male in NAD, with dementia, appears stated age. EOMI, respirations unlabored.  On physical exam dressing c/d/i, well placed and in good repair.  Right ankle joint mobile, DP pulse to left not palpable. Decreased sensation to light touch noted.   Assessment/Plan: Surgery this afternoon for right BKA Pt's daughter aware and agrees with assessment and plan   Kelsye Loomer S 12/22/2013, 7:51 AM

## 2013-12-22 NOTE — Progress Notes (Addendum)
PATIENT IS  Jesse Chandler- DOES NOT TAKE BLOOD PRODUCTS                                            Patient Demographics  Lekendrick Alpern, is a 80 y.o. male, DOB - 08-Dec-1923, WUJ:[id number]  Admit date - 12/20/2013   Admitting Physician Thurnell Lose, MD  Outpatient Primary MD for the patient is DOUGH,ROBERT, MD  LOS - 2   Chief Complaint  Patient presents with  . wound, pressure ulcers         Assessment & Plan    Right leg diabetic ulcer suspicious for osteomyelitis -  -blood cultures obtained -wound culture obtained -vancomycin and meropenem. -consult infectious disease physician Dr. Johnnye Sima and Dr. Doran Durand orthopedics:  amputation might be the best outcome,BKA on 12/22/2013.  Stage II sacral decubitus ulcer. Resulted wound care.     Mild diarrhea. Likely due to being on 3 different antibiotics including Augmentin, placed on probiotic and ruled out C. Difficile.     Diabetes mellitus type 2. In poor control, checked A1c, place have lowered the started Lantus dose, continue sliding scale insulin.    History of paroxysmal atrial fibrillation. Goal rate control, place on low-dose beta blocker, Not on anticoagulation. Telemetry monitoring while here.  Hypothyroidism continue home Synthroid.    CKD stage III. Currently appears to be at baseline from 1.3.    History of prostate cancer: Status post suprapubic catheter   Left bundle branch block - unknown chronicity, previous EKG shows partial block, no chest pain, serial troponin negative -echo pending    Anemia - likely anemia of chronic disease, check anemia panel.     Code Status: Full- will need to clarify with family CODE STATUS  Family Communication: no family at bedside  Disposition Plan: SNF   Procedures  Echo ordered   Consults ID Dr. Johnnye Sima,  orthopedics Dr. Doran Durand   Medications  Scheduled Meds: . aspirin  81 mg Oral Daily  . B-complex with vitamin C  1 tablet Oral Daily  . calcium-vitamin D  1 tablet Oral BID  . chlorhexidine  60 mL Topical Once  . diltiazem  240 mg Oral Daily  . enzalutamide  160 mg Oral QPC supper  . feeding supplement (GLUCERNA SHAKE)  237 mL Oral TID BM  . feeding supplement (PRO-STAT SUGAR FREE 64)  30 mL Oral Daily  . ferrous fumarate  1 tablet Oral BID  . folic acid  1 mg Oral Daily  . furosemide  60 mg Oral BID  . guaiFENesin  1,200 mg Oral BID  . heparin  5,000 Units Subcutaneous 3 times per day  . influenza vac split quadrivalent PF  0.5 mL Intramuscular Tomorrow-1000  . insulin aspart  0-15 Units Subcutaneous TID WC  . insulin aspart  0-5 Units Subcutaneous QHS  . insulin glargine  10 Units Subcutaneous Daily  . levothyroxine  50 mcg Oral QAC breakfast  . meropenem (MERREM) IV  1 g Intravenous Q8H  . metoprolol tartrate  12.5 mg Oral BID  . multivitamin with minerals  1 tablet Oral Daily  . nutrition supplement (JUVEN)  1 packet Oral BID BM  . pantoprazole  40 mg Oral Daily  . pneumococcal 23 valent vaccine  0.5 mL Intramuscular Tomorrow-1000  . polyethylene glycol  17 g Oral Daily  . potassium chloride SA  40 mEq Oral  BID  . saccharomyces boulardii  250 mg Oral BID  . vancomycin  1,500 mg Intravenous Q24H  . vitamin C  500 mg Oral Daily   Continuous Infusions: . dextrose 5 % and 0.45% NaCl     PRN Meds:.guaiFENesin-dextromethorphan, HYDROcodone-acetaminophen, ipratropium-albuterol, loperamide, magnesium hydroxide, morphine injection, ondansetron (ZOFRAN) IV, ondansetron, sodium chloride  DVT Prophylaxis  Heparin   Lab Results  Component Value Date   PLT 262 12/21/2013    Antibiotics    Anti-infectives   Start     Dose/Rate Route Frequency Ordered Stop   12/21/13 1600  vancomycin (VANCOCIN) 1,500 mg in sodium chloride 0.9 % 500 mL IVPB     1,500 mg 250 mL/hr over 120  Minutes Intravenous Every 24 hours 12/20/13 1747     12/21/13 0200  meropenem (MERREM) 1 g in sodium chloride 0.9 % 100 mL IVPB     1 g 200 mL/hr over 30 Minutes Intravenous Every 8 hours 12/20/13 1747     12/20/13 1715  meropenem (MERREM) 1 g in sodium chloride 0.9 % 100 mL IVPB     1 g 200 mL/hr over 30 Minutes Intravenous NOW 12/20/13 1711 12/20/13 1840   12/20/13 1445  vancomycin (VANCOCIN) 2,000 mg in sodium chloride 0.9 % 500 mL IVPB     2,000 mg 250 mL/hr over 120 Minutes Intravenous  Once 12/20/13 1427 12/20/13 1805          Subjective:   [name] requesting to eat, no SOB, no CP, no fevers, no chills.   Objective:   Filed Vitals:   12/21/13 1344 12/21/13 1500 12/21/13 2030 12/22/13 0548  BP: 131/50 126/44 120/58 124/58  Pulse: 76 83 80 78  Temp: 97.8 F (36.6 C) 98.3 F (36.8 C) 98.6 F (37 C) 98.2 F (36.8 C)  TempSrc: Oral Oral Oral Oral  Resp: 16 16 16 16   Height:      Weight:      SpO2: 100% 100% 99% 97%    Wt Readings from Last 3 Encounters:  12/21/13 102.4 kg (225 lb 12 oz)  12/21/13 102.4 kg (225 lb 12 oz)  05/31/13 97.523 kg (215 lb)     Intake/Output Summary (Last 24 hours) at 12/22/13 0758 Last data filed at 12/22/13 0653  Gross per 24 hour  Intake   1755 ml  Output   1550 ml  Net    205 ml     Physical Exam  Awake Alert, Oriented X 3 Symmetrical Chest wall movement, Good air movement bilaterally, CTAB RRR,No Gallops,Rubs or new Murmurs +ve B.Sounds, Abd Soft, Non tender, No organomegaly appriciated, No rebound - guarding or rigidity. Suprapubic catheter in place left BKA, right leg ulcer under bandage.   Data Review   Micro Results Recent Results (from the past 240 hour(s))  CULTURE, BLOOD (ROUTINE X 2)     Status: None   Collection Time    12/20/13 12:48 PM      Result Value Ref Range Status   Specimen Description BLOOD RIGHT ANTECUBITAL   Final   Special Requests BOTTLES DRAWN AEROBIC AND ANAEROBIC 5ML   Final    Culture  Setup Time     Final   Value: 12/20/2013 16:03     Performed at [facility]   Culture     Final   Value:        BLOOD CULTURE RECEIVED NO GROWTH TO DATE CULTURE WILL BE HELD FOR 5 DAYS BEFORE ISSUING A FINAL NEGATIVE REPORT  Performed at [facility]   Report Status PENDING   Incomplete  CULTURE, BLOOD (ROUTINE X 2)     Status: None   Collection Time    12/20/13 12:48 PM      Result Value Ref Range Status   Specimen Description BLOOD LEFT ANTECUBITAL   Final   Special Requests BOTTLES DRAWN AEROBIC AND ANAEROBIC 5ML   Final   Culture  Setup Time     Final   Value: 12/20/2013 16:02     Performed at [facility]   Culture     Final   Value:        BLOOD CULTURE RECEIVED NO GROWTH TO DATE CULTURE WILL BE HELD FOR 5 DAYS BEFORE ISSUING A FINAL NEGATIVE REPORT     Performed at [facility]   Report Status PENDING   Incomplete  WOUND CULTURE     Status: None   Collection Time    12/20/13  2:42 PM      Result Value Ref Range Status   Specimen Description WOUND   Final   Special Requests Normal   Final   Gram Stain     Final   Value: NO WBC SEEN     NO SQUAMOUS EPITHELIAL CELLS SEEN     RARE GRAM POSITIVE COCCI IN PAIRS     RARE GRAM NEGATIVE RODS     Performed at [facility]   Culture     Final   Value: MULTIPLE ORGANISMS PRESENT, NONE PREDOMINANT     Performed at [facility]   Report Status PENDING   Incomplete  CLOSTRIDIUM DIFFICILE BY PCR     Status: None   Collection Time    12/20/13  3:20 PM      Result Value Ref Range Status   C difficile by pcr NEGATIVE  NEGATIVE Final   Comment: Performed at [facility]  SURGICAL PCR SCREEN     Status: None   Collection Time    12/22/13  2:23 AM      Result Value Ref Range Status   MRSA, PCR NEGATIVE  NEGATIVE Final   Staphylococcus aureus NEGATIVE  NEGATIVE Final   Comment:            The Xpert SA Assay (FDA     approved for NASAL specimens     in patients over  69 years of age),     is one component of     a comprehensive surveillance     program.  Test performance has     been validated by [employer] for patients greater     than or equal to 91 year old.     It is not intended     to diagnose infection nor to     guide or monitor treatment.    Radiology Reports Dg Chest Port 1 View  12/20/2013   CLINICAL DATA:  Fever, atrial fibrillation  EXAM: PORTABLE CHEST - 1 VIEW  COMPARISON:  12/14/2013  FINDINGS: Background hyperinflation compatible with COPD/ emphysema. Similar small pleural effusions persist with compressive basilar atelectasis. No significant interval change. Upper lobes remain clear. No pneumothorax. Trachea is midline.  IMPRESSION: COPD/ emphysema  Similar small effusions with basilar compressive atelectasis   Electronically Signed   By: Daryll Brod M.D.   On: 12/20/2013 15:56    CBC  Recent Labs Lab 12/20/13 1248 12/21/13 0440  WBC 17.1* 12.2*  HGB 8.7* 7.6*  HCT 27.9* 25.5*  PLT 287 262  MCV 91.5 92.4  MCH 28.5 27.5  MCHC 31.2 29.8*  RDW 15.5 15.8*    Chemistries   Recent Labs Lab 12/20/13 1248 12/21/13 0440  NA 138 137  K 4.9 4.7  CL 105 106  CO2 21 19  GLUCOSE 222* 123*  BUN 34* 30*  CREATININE 1.21 1.13  CALCIUM 9.7 9.2  AST 10  --   ALT <5  --   ALKPHOS 115  --   BILITOT <0.2*  --    ------------------------------------------------------------------------------------------------------------------ estimated creatinine clearance is 56.8 ml/min (by C-G formula based on Cr of 1.13). ------------------------------------------------------------------------------------------------------------------  Recent Labs  12/20/13 1248  HGBA1C 8.0*   ------------------------------------------------------------------------------------------------------------------ No results found for this basename: CHOL, HDL, LDLCALC, TRIG, CHOLHDL, LDLDIRECT,  in the last 72  hours ------------------------------------------------------------------------------------------------------------------ No results found for this basename: TSH, T4TOTAL, FREET3, T3FREE, THYROIDAB,  in the last 72 hours ------------------------------------------------------------------------------------------------------------------  Recent Labs  12/21/13 1130  VITAMINB12 1227*  FOLATE >20.0  FERRITIN 290  RETICCTPCT 3.5*    Coagulation profile No results found for this basename: INR, PROTIME,  in the last 168 hours  No results found for this basename: DDIMER,  in the last 72 hours  Cardiac Enzymes  Recent Labs Lab 12/21/13 1130 12/21/13 2250 12/22/13 0552  TROPONINI <0.30 <0.30 <0.30   ------------------------------------------------------------------------------------------------------------------ No components found with this basename: POCBNP,      Time Spent in minutes  35   Muskaan Smet  DO on 12/22/2013 at 7:58 AM

## 2013-12-22 NOTE — Anesthesia Procedure Notes (Signed)
Procedure Name: Intubation Date/Time: 12/22/2013 4:55 PM Performed by: Carola Frost Pre-anesthesia Checklist: Patient identified, Timeout performed, Emergency Drugs available, Suction available and Patient being monitored Patient Re-evaluated:Patient Re-evaluated prior to inductionOxygen Delivery Method: Circle system utilized Preoxygenation: Pre-oxygenation with 100% oxygen Intubation Type: IV induction Laryngoscope Size: Mac and 4 Grade View: Grade I Tube type: Oral Tube size: 7.5 mm Number of attempts: 1 Airway Equipment and Method: Stylet Placement Confirmation: CO2 detector,  positive ETCO2,  ETT inserted through vocal cords under direct vision and breath sounds checked- equal and bilateral Secured at: 23 cm Tube secured with: Tape Dental Injury: Teeth and Oropharynx as per pre-operative assessment

## 2013-12-23 DIAGNOSIS — N183 Chronic kidney disease, stage 3 unspecified: Secondary | ICD-10-CM

## 2013-12-23 DIAGNOSIS — I517 Cardiomegaly: Secondary | ICD-10-CM

## 2013-12-23 DIAGNOSIS — L89109 Pressure ulcer of unspecified part of back, unspecified stage: Secondary | ICD-10-CM

## 2013-12-23 DIAGNOSIS — L8992 Pressure ulcer of unspecified site, stage 2: Secondary | ICD-10-CM

## 2013-12-23 LAB — CBC
HCT: 19.7 % — ABNORMAL LOW (ref 39.0–52.0)
HEMATOCRIT: 19.5 % — AB (ref 39.0–52.0)
Hemoglobin: 6.2 g/dL — CL (ref 13.0–17.0)
Hemoglobin: 6.3 g/dL — CL (ref 13.0–17.0)
MCH: 29.5 pg (ref 26.0–34.0)
MCH: 29.7 pg (ref 26.0–34.0)
MCHC: 31.8 g/dL (ref 30.0–36.0)
MCHC: 32 g/dL (ref 30.0–36.0)
MCV: 92.9 fL (ref 78.0–100.0)
MCV: 92.9 fL (ref 78.0–100.0)
PLATELETS: 206 10*3/uL (ref 150–400)
PLATELETS: 213 10*3/uL (ref 150–400)
RBC: 2.1 MIL/uL — AB (ref 4.22–5.81)
RBC: 2.12 MIL/uL — ABNORMAL LOW (ref 4.22–5.81)
RDW: 16.4 % — AB (ref 11.5–15.5)
RDW: 16.7 % — AB (ref 11.5–15.5)
WBC: 10.4 10*3/uL (ref 4.0–10.5)
WBC: 14.1 10*3/uL — ABNORMAL HIGH (ref 4.0–10.5)

## 2013-12-23 LAB — WOUND CULTURE
Gram Stain: NONE SEEN
SPECIAL REQUESTS: NORMAL

## 2013-12-23 LAB — BASIC METABOLIC PANEL
BUN: 19 mg/dL (ref 6–23)
CHLORIDE: 103 meq/L (ref 96–112)
CO2: 22 mEq/L (ref 19–32)
Calcium: 8.3 mg/dL — ABNORMAL LOW (ref 8.4–10.5)
Creatinine, Ser: 0.9 mg/dL (ref 0.50–1.35)
GFR calc non Af Amer: 74 mL/min — ABNORMAL LOW (ref >90)
GFR, EST AFRICAN AMERICAN: 86 mL/min — AB (ref >90)
Glucose, Bld: 133 mg/dL — ABNORMAL HIGH (ref 70–99)
Potassium: 4.1 mEq/L (ref 3.7–5.3)
Sodium: 136 mEq/L — ABNORMAL LOW (ref 137–147)

## 2013-12-23 LAB — GLUCOSE, CAPILLARY
Glucose-Capillary: 115 mg/dL — ABNORMAL HIGH (ref 70–99)
Glucose-Capillary: 154 mg/dL — ABNORMAL HIGH (ref 70–99)
Glucose-Capillary: 135 mg/dL — ABNORMAL HIGH (ref 70–99)
Glucose-Capillary: 115 mg/dL — ABNORMAL HIGH (ref 70–99)

## 2013-12-23 MED ORDER — INSULIN GLARGINE 100 UNIT/ML ~~LOC~~ SOLN
5.0000 [IU] | Freq: Every day | SUBCUTANEOUS | Status: DC
  Administered 2013-12-24 – 2013-12-27 (×3): 5 [IU] via SUBCUTANEOUS
  Filled 2013-12-23 (×4): qty 0.05

## 2013-12-23 NOTE — Progress Notes (Signed)
Orthopedic Tech Progress Note Patient Details:  SKYY MCKNIGHT 07/12/1924 [id number]  Patient ID: Luanna Salk, male   DOB: 1924/08/29, 80 y.o.   MRN: [id number] Called in hanger brace order ; spoke with Linzie Collin, Aijah Lattner 12/23/2013, 2:12 PM

## 2013-12-23 NOTE — Progress Notes (Signed)
OT Cancellation Note  Patient Details Name: Jesse Chandler MRN: [id number] DOB: 1923-10-12   Cancelled Treatment:    Reason Eval/Treat Not Completed: Fatigue/lethargy limiting ability to participate. Pt just up with PT and per PT report, pt is lethargic and having difficulty with mobilizing. Will hold on OT eval today and check back in am.  Jules Schick [phone] 12/23/2013, 3:09 PM

## 2013-12-23 NOTE — Progress Notes (Addendum)
Subjective: 1 Day Post-Op Procedure(s) (LRB): AMPUTATION BELOW KNEE (Right) Patient reports pain as none..   Daughter at bedside.  No n/v.  Objective: Vital signs in last 24 hours: Temp:  [96.8 F (36 C)-98.7 F (37.1 C)] 98.7 F (37.1 C) (03/28 1610) Pulse Rate:  [64-85] 75 (03/28 0633) Resp:  [10-18] 18 (03/28 9604) BP: (100-113)/(35-69) 110/46 mmHg (03/28 0916) SpO2:  [97 %-100 %] 99 % (03/28 0633)  Intake/Output from previous day: 03/27 0701 - 03/28 0700 In: 1960 [P.O.:360; I.V.:1600] Out: 501 [Stool:1; Blood:500] Intake/Output this shift:     Recent Labs  12/20/13 1248 12/21/13 0440 12/23/13 0345  HGB 8.7* 7.6* 6.2*    Recent Labs  12/21/13 0440 12/21/13 1130 12/23/13 0345  WBC 12.2*  --  10.4  RBC 2.76* 2.82* 2.10*  HCT 25.5*  --  19.5*  PLT 262  --  206    Recent Labs  12/21/13 0440 12/23/13 0345  NA 137 136*  K 4.7 4.1  CL 106 103  CO2 19 22  BUN 30* 19  CREATININE 1.13 0.90  GLUCOSE 123* 133*  CALCIUM 9.2 8.3*   No results found for this basename: LABPT, INR,  in the last 72 hours  PE:  R LE dressed and dry.  Knee immobilizer in place.  Assessment/Plan: 1 Day Post-Op Procedure(s) (LRB): AMPUTATION BELOW KNEE (Right) Up with therapy Iron for acute blood loss anemia.  Pt does not take any blood products. I'll order a stump protector. Consider d/c abx after 24 hours post op since amputation was done through a clean non-infected area.  Wylene Simmer 12/23/2013, 10:22 AM

## 2013-12-23 NOTE — Progress Notes (Addendum)
ANTIBIOTIC CONSULT NOTE - FOLLOW UP  Pharmacy Consult for Vancomycin + Meropenem & Aranesp Indication: Infected decubitus ulcer & severe anemia  No Known Allergies  Patient Measurements: Height: 6\' 1"  (185.4 cm) Weight: 225 lb 12 oz (102.4 kg) IBW/kg (Calculated) : 79.9  Vital Signs: Temp: 98.2 F (36.8 C) (03/28 1443) BP: 106/39 mmHg (03/28 1443) Pulse Rate: 80 (03/28 1443) Intake/Output from previous day: 03/27 0701 - 03/28 0700 In: 1960 [P.O.:360; I.V.:1600] Out: 501 [Stool:1; Blood:500] Intake/Output from this shift: Total I/O In: 240 [P.O.:240] Out: 1 [Stool:1]  Labs:  Recent Labs  12/21/13 0440 12/23/13 0345  WBC 12.2* 10.4  HGB 7.6* 6.2*  PLT 262 206  CREATININE 1.13 0.90   Estimated Creatinine Clearance: 71.3 ml/min (by C-G formula based on Cr of 0.9). No results found for this basename: VANCOTROUGH, VANCOPEAK, VANCORANDOM, GENTTROUGH, GENTPEAK, GENTRANDOM, TOBRATROUGH, TOBRAPEAK, TOBRARND, AMIKACINPEAK, AMIKACINTROU, AMIKACIN,  in the last 72 hours   Microbiology: Recent Results (from the past 720 hour(s))  CULTURE, BLOOD (ROUTINE X 2)     Status: None   Collection Time    12/20/13 12:48 PM      Result Value Ref Range Status   Specimen Description BLOOD RIGHT ANTECUBITAL   Final   Special Requests BOTTLES DRAWN AEROBIC AND ANAEROBIC 5ML   Final   Culture  Setup Time     Final   Value: 12/20/2013 16:03     Performed at [facility]   Culture     Final   Value:        BLOOD CULTURE RECEIVED NO GROWTH TO DATE CULTURE WILL BE HELD FOR 5 DAYS BEFORE ISSUING A FINAL NEGATIVE REPORT     Performed at [facility]   Report Status PENDING   Incomplete  CULTURE, BLOOD (ROUTINE X 2)     Status: None   Collection Time    12/20/13 12:48 PM      Result Value Ref Range Status   Specimen Description BLOOD LEFT ANTECUBITAL   Final   Special Requests BOTTLES DRAWN AEROBIC AND ANAEROBIC 5ML   Final   Culture  Setup Time     Final   Value: 12/20/2013  16:02     Performed at [facility]   Culture     Final   Value:        BLOOD CULTURE RECEIVED NO GROWTH TO DATE CULTURE WILL BE HELD FOR 5 DAYS BEFORE ISSUING A FINAL NEGATIVE REPORT     Performed at [facility]   Report Status PENDING   Incomplete  WOUND CULTURE     Status: None   Collection Time    12/20/13  2:42 PM      Result Value Ref Range Status   Specimen Description WOUND   Final   Special Requests Normal   Final   Gram Stain     Final   Value: NO WBC SEEN     NO SQUAMOUS EPITHELIAL CELLS SEEN     RARE GRAM POSITIVE COCCI IN PAIRS     RARE GRAM NEGATIVE RODS     Performed at [facility]   Culture     Final   Value: MULTIPLE ORGANISMS PRESENT, NONE PREDOMINANT     Note: NO STAPHYLOCOCCUS AUREUS ISOLATED NO GROUP A STREP (S.PYOGENES) ISOLATED     Performed at [facility]   Report Status 12/23/2013 FINAL   Final  URINE CULTURE     Status: None   Collection Time  12/20/13  3:20 PM      Result Value Ref Range Status   Specimen Description URINE, SUPRAPUBIC   Final   Special Requests NONE   Final   Culture  Setup Time     Final   Value: 12/21/2013 06:06     Performed at [facility] Count     Final   Value: NO GROWTH     Performed at [facility]   Culture     Final   Value: NO GROWTH     Performed at [facility]   Report Status 12/22/2013 FINAL   Final  CLOSTRIDIUM DIFFICILE BY PCR     Status: None   Collection Time    12/20/13  3:20 PM      Result Value Ref Range Status   C difficile by pcr NEGATIVE  NEGATIVE Final   Comment: Performed at [facility]  SURGICAL PCR SCREEN     Status: None   Collection Time    12/22/13  2:23 AM      Result Value Ref Range Status   MRSA, PCR NEGATIVE  NEGATIVE Final   Staphylococcus aureus NEGATIVE  NEGATIVE Final   Comment:            The Xpert SA Assay (FDA     approved for NASAL specimens     in patients over 60 years of age),     is one  component of     a comprehensive surveillance     program.  Test performance has     been validated by [employer] for patients greater     than or equal to 71 year old.     It is not intended     to diagnose infection nor to     guide or monitor treatment.    Anti-infectives   Start     Dose/Rate Route Frequency Ordered Stop   12/21/13 1600  vancomycin (VANCOCIN) 1,500 mg in sodium chloride 0.9 % 500 mL IVPB     1,500 mg 250 mL/hr over 120 Minutes Intravenous Every 24 hours 12/20/13 1747     12/21/13 0200  meropenem (MERREM) 1 g in sodium chloride 0.9 % 100 mL IVPB     1 g 200 mL/hr over 30 Minutes Intravenous Every 8 hours 12/20/13 1747     12/20/13 1715  meropenem (MERREM) 1 g in sodium chloride 0.9 % 100 mL IVPB     1 g 200 mL/hr over 30 Minutes Intravenous NOW 12/20/13 1711 12/20/13 1840   12/20/13 1445  vancomycin (VANCOCIN) 2,000 mg in sodium chloride 0.9 % 500 mL IVPB     2,000 mg 250 mL/hr over 120 Minutes Intravenous  Once 12/20/13 1427 12/20/13 1805      Assessment: 80 y.o. M who continues on Vancomycin + Meropenem for an infected decubitus ulcer and RLE non-healing wound (now s/p BKA on 3/27). Per ID, to continue antibiotics for 2 weeks. Will hold off on checking a Vancomycin trough for now while the patient's Hgb remains low.   Pharmacy was also consulted to dose Aranesp for severe anemia in the setting of the patient being a Jehovah's Witness and refusing blood products. The patient just received a dose of Aranesp 500 mcg at the cancer center on 12/14/13. Given the recent administration and kinetics of the drug, it would not be beneficial to administer more Aranesp at this time. This information was discussed with  the attending physician, Dr. Eliseo Squires -- pharmacy will sign off of Aranesp protocol. The patient did receive a dose of Feraheme this a.m which may have some effect to raise the patient's Hgb levels.  Goal of Therapy:  Vancomycin trough of 10-20 mcg/ml  Plan:   1. Continue Vancomycin 1500 mg IV every 24 hours 2. Continue Meropenem 1g IV every 8 hours 3. No additional Aranesp needed at this time -- pharmacy will sign off protocol. 4. Will continue to follow renal function, culture results, LOT, and antibiotic de-escalation plans   Alycia Rossetti, PharmD, BCPS Clinical Pharmacist Pager: [phone] 12/23/2013 3:34 PM

## 2013-12-23 NOTE — Progress Notes (Signed)
PATIENT IS  JEHOVAH'S WITNESS- DOES NOT TAKE BLOOD PRODUCTS                                            Patient Demographics  Jesse Chandler, is a 80 y.o. male, DOB - [birth year], ZOX:[id number]  Admit date - 12/20/2013   Admitting Physician Leroy Sea, MD  Outpatient Primary MD for the patient is DOUGH,ROBERT, MD  LOS - 3   Chief Complaint  Patient presents with  . wound, pressure ulcers         Assessment & Plan    Right leg diabetic ulcer suspicious for osteomyelitis -  -blood cultures obtained -wound culture obtained -vancomycin and meropenem. -consult infectious disease physician Dr. Ninetta Lights and Dr. Victorino Dike orthopedics:  amputation might be the best outcome,BKA on 12/22/2013.  Stage II sacral decubitus ulcer. Resulted wound care.     Mild diarrhea. Likely due to being on 3 different antibiotics including Augmentin, placed on probiotic and ruled out C. Difficile.     Diabetes mellitus type 2. In poor control, checked A1c, place have lowered the started Lantus dose, continue sliding scale insulin.    History of paroxysmal atrial fibrillation. Goal rate control, place on low-dose beta blocker, Not on anticoagulation. Telemetry monitoring while here.  Hypothyroidism continue home Synthroid.    CKD stage III. Currently appears to be at baseline from 1.3.    History of prostate cancer: Status post suprapubic catheter   Left bundle branch block - unknown chronicity, previous EKG shows partial block, no chest pain, serial troponin negative -echo pending    Anemia - does not take blood products -got arasnep 18th of March- gets monthly     Code Status: DNR  Family Communication: daughter at bedside  Disposition Plan: SNF   Procedures  Echo ordered   Consults ID Dr. Ninetta Lights, orthopedics Dr. Victorino Dike   Medications  Scheduled Meds: . aspirin   81 mg Oral Daily  . B-complex with vitamin C  1 tablet Oral Daily  . calcium-vitamin D  1 tablet Oral BID  . diltiazem  240 mg Oral Daily  . enoxaparin (LOVENOX) injection  40 mg Subcutaneous Q24H  . enzalutamide  160 mg Oral QPC supper  . feeding supplement (GLUCERNA SHAKE)  237 mL Oral TID BM  . feeding supplement (PRO-STAT SUGAR FREE 64)  30 mL Oral Daily  . folic acid  1 mg Oral Daily  . furosemide  60 mg Oral BID  . guaiFENesin  1,200 mg Oral BID  . influenza vac split quadrivalent PF  0.5 mL Intramuscular Tomorrow-1000  . insulin aspart  0-15 Units Subcutaneous TID WC  . insulin aspart  0-5 Units Subcutaneous QHS  . insulin glargine  10 Units Subcutaneous Daily  . levothyroxine  50 mcg Oral QAC breakfast  . meropenem (MERREM) IV  1 g Intravenous Q8H  . metoprolol tartrate  12.5 mg Oral BID  . multivitamin with minerals  1 tablet Oral Daily  . nutrition supplement (JUVEN)  1 packet Oral BID BM  . pantoprazole  40 mg Oral Daily  . pneumococcal 23 valent vaccine  0.5 mL Intramuscular Tomorrow-1000  . polyethylene glycol  17 g Oral Daily  . potassium chloride SA  40 mEq Oral BID  . saccharomyces boulardii  250 mg Oral BID  . vancomycin  1,500 mg Intravenous Q24H  . vitamin C  500 mg Oral Daily   Continuous Infusions: . dextrose 5 % and 0.45% NaCl 75 mL/hr (12/22/13 1048)  . lactated ringers 20 mL/hr at 12/22/13 1613   PRN Meds:.guaiFENesin-dextromethorphan, HYDROcodone-acetaminophen, ipratropium-albuterol, loperamide, magnesium hydroxide, morphine injection, ondansetron (ZOFRAN) IV, ondansetron, sodium chloride  DVT Prophylaxis  Heparin   Lab Results  Component Value Date   PLT 206 12/23/2013    Antibiotics    Anti-infectives   Start     Dose/Rate Route Frequency Ordered Stop   12/21/13 1600  vancomycin (VANCOCIN) 1,500 mg in sodium chloride 0.9 % 500 mL IVPB     1,500 mg 250 mL/hr over 120 Minutes Intravenous Every 24 hours 12/20/13 1747     12/21/13 0200  meropenem  (MERREM) 1 g in sodium chloride 0.9 % 100 mL IVPB     1 g 200 mL/hr over 30 Minutes Intravenous Every 8 hours 12/20/13 1747     12/20/13 1715  meropenem (MERREM) 1 g in sodium chloride 0.9 % 100 mL IVPB     1 g 200 mL/hr over 30 Minutes Intravenous NOW 12/20/13 1711 12/20/13 1840   12/20/13 1445  vancomycin (VANCOCIN) 2,000 mg in sodium chloride 0.9 % 500 mL IVPB     2,000 mg 250 mL/hr over 120 Minutes Intravenous  Once 12/20/13 1427 12/20/13 1805          Subjective:   Jesse Chandler doing well- ate breakfast per daughter, no pain  Objective:   Filed Vitals:   12/23/13 0047 12/23/13 0334 12/23/13 0633 12/23/13 0916  BP: 104/35 100/42 108/40 110/46  Pulse: 73  75   Temp:   98.7 F (37.1 C)   TempSrc:      Resp:   18   Height:      Weight:      SpO2:   99%     Wt Readings from Last 3 Encounters:  12/21/13 102.4 kg (225 lb 12 oz)  12/21/13 102.4 kg (225 lb 12 oz)  05/31/13 97.523 kg (215 lb)     Intake/Output Summary (Last 24 hours) at 12/23/13 1123 Last data filed at 12/23/13 0645  Gross per 24 hour  Intake   1960 ml  Output    500 ml  Net   1460 ml     Physical Exam  Awake Alert, Oriented X 3 Symmetrical Chest wall movement, Good air movement bilaterally, CTAB RRR,No Gallops,Rubs or new Murmurs +ve B.Sounds, Abd Soft, Non tender, No organomegaly appriciated, No rebound - guarding or rigidity. Suprapubic catheter in place left BKA, right leg ulcer under bandage.   Data Review   Micro Results Recent Results (from the past 240 hour(s))  CULTURE, BLOOD (ROUTINE X 2)     Status: None   Collection Time    12/20/13 12:48 PM      Result Value Ref Range Status   Specimen Description BLOOD RIGHT ANTECUBITAL   Final   Special Requests BOTTLES DRAWN AEROBIC AND ANAEROBIC 5ML   Final   Culture  Setup Time     Final   Value: 12/20/2013 16:03     Performed at [facility]   Culture     Final   Value:        BLOOD CULTURE RECEIVED NO GROWTH TO DATE  CULTURE WILL BE HELD FOR 5 DAYS BEFORE ISSUING A FINAL NEGATIVE REPORT     Performed at [facility]   Report Status PENDING   Incomplete  CULTURE, BLOOD (ROUTINE X 2)  Status: None   Collection Time    12/20/13 12:48 PM      Result Value Ref Range Status   Specimen Description BLOOD LEFT ANTECUBITAL   Final   Special Requests BOTTLES DRAWN AEROBIC AND ANAEROBIC 5ML   Final   Culture  Setup Time     Final   Value: 12/20/2013 16:02     Performed at [facility]   Culture     Final   Value:        BLOOD CULTURE RECEIVED NO GROWTH TO DATE CULTURE WILL BE HELD FOR 5 DAYS BEFORE ISSUING A FINAL NEGATIVE REPORT     Performed at [facility]   Report Status PENDING   Incomplete  WOUND CULTURE     Status: None   Collection Time    12/20/13  2:42 PM      Result Value Ref Range Status   Specimen Description WOUND   Final   Special Requests Normal   Final   Gram Stain     Final   Value: NO WBC SEEN     NO SQUAMOUS EPITHELIAL CELLS SEEN     RARE GRAM POSITIVE COCCI IN PAIRS     RARE GRAM NEGATIVE RODS     Performed at [facility]   Culture     Final   Value: MULTIPLE ORGANISMS PRESENT, NONE PREDOMINANT     Note: NO STAPHYLOCOCCUS AUREUS ISOLATED NO GROUP A STREP (S.PYOGENES) ISOLATED     Performed at [facility]   Report Status 12/23/2013 FINAL   Final  URINE CULTURE     Status: None   Collection Time    12/20/13  3:20 PM      Result Value Ref Range Status   Specimen Description URINE, SUPRAPUBIC   Final   Special Requests NONE   Final   Culture  Setup Time     Final   Value: 12/21/2013 06:06     Performed at [facility] Count     Final   Value: NO GROWTH     Performed at [facility]   Culture     Final   Value: NO GROWTH     Performed at [facility]   Report Status 12/22/2013 FINAL   Final  CLOSTRIDIUM DIFFICILE BY PCR     Status: None   Collection Time    12/20/13  3:20 PM      Result Value Ref  Range Status   C difficile by pcr NEGATIVE  NEGATIVE Final   Comment: Performed at [facility]  SURGICAL PCR SCREEN     Status: None   Collection Time    12/22/13  2:23 AM      Result Value Ref Range Status   MRSA, PCR NEGATIVE  NEGATIVE Final   Staphylococcus aureus NEGATIVE  NEGATIVE Final   Comment:            The Xpert SA Assay (FDA     approved for NASAL specimens     in patients over 49 years of age),     is one component of     a comprehensive surveillance     program.  Test performance has     been validated by [employer] for patients greater     than or equal to 48 year old.     It is not intended     to diagnose infection nor  to     guide or monitor treatment.    Radiology Reports Dg Chest Port 1 View  12/20/2013   CLINICAL DATA:  Fever, atrial fibrillation  EXAM: PORTABLE CHEST - 1 VIEW  COMPARISON:  12/14/2013  FINDINGS: Background hyperinflation compatible with COPD/ emphysema. Similar small pleural effusions persist with compressive basilar atelectasis. No significant interval change. Upper lobes remain clear. No pneumothorax. Trachea is midline.  IMPRESSION: COPD/ emphysema  Similar small effusions with basilar compressive atelectasis   Electronically Signed   By: Daryll Brod M.D.   On: 12/20/2013 15:56    CBC  Recent Labs Lab 12/20/13 1248 12/21/13 0440 12/23/13 0345  WBC 17.1* 12.2* 10.4  HGB 8.7* 7.6* 6.2*  HCT 27.9* 25.5* 19.5*  PLT 287 262 206  MCV 91.5 92.4 92.9  MCH 28.5 27.5 29.5  MCHC 31.2 29.8* 31.8  RDW 15.5 15.8* 16.4*    Chemistries   Recent Labs Lab 12/20/13 1248 12/21/13 0440 12/23/13 0345  NA 138 137 136*  K 4.9 4.7 4.1  CL 105 106 103  CO2 21 19 22   GLUCOSE 222* 123* 133*  BUN 34* 30* 19  CREATININE 1.21 1.13 0.90  CALCIUM 9.7 9.2 8.3*  AST 10  --   --   ALT <5  --   --   ALKPHOS 115  --   --   BILITOT <0.2*  --   --     ------------------------------------------------------------------------------------------------------------------ estimated creatinine clearance is 71.3 ml/min (by C-G formula based on Cr of 0.9). ------------------------------------------------------------------------------------------------------------------  Recent Labs  12/20/13 1248  HGBA1C 8.0*   ------------------------------------------------------------------------------------------------------------------ No results found for this basename: CHOL, HDL, LDLCALC, TRIG, CHOLHDL, LDLDIRECT,  in the last 72 hours ------------------------------------------------------------------------------------------------------------------ No results found for this basename: TSH, T4TOTAL, FREET3, T3FREE, THYROIDAB,  in the last 72 hours ------------------------------------------------------------------------------------------------------------------  Recent Labs  12/21/13 1130  VITAMINB12 1227*  FOLATE >20.0  FERRITIN 290  TIBC 176*  IRON 50  RETICCTPCT 3.5*    Coagulation profile No results found for this basename: INR, PROTIME,  in the last 168 hours  No results found for this basename: DDIMER,  in the last 72 hours  Cardiac Enzymes  Recent Labs Lab 12/21/13 1130 12/21/13 2250 12/22/13 0552  TROPONINI <0.30 <0.30 <0.30   ------------------------------------------------------------------------------------------------------------------ No components found with this basename: POCBNP,      Time Spent in minutes  35   Shatisha Falter  DO on 12/23/2013 at 11:23 AM

## 2013-12-23 NOTE — Op Note (Signed)
Jesse Chandler, NEARHOOD NO.:  [id number]  MEDICAL RECORD NO.:  [id number]  LOCATION:  [id number]                        FACILITY:  [facility]  PHYSICIAN:  Wylene Simmer, MD        DATE OF BIRTH:  09-Jan-1924  DATE OF PROCEDURE:  12/22/2013 DATE OF DISCHARGE:                              OPERATIVE REPORT   PREOPERATIVE DIAGNOSIS:  Nonhealing right leg diabetic wound.  POSTOPERATIVE DIAGNOSIS:  Nonhealing right leg diabetic wound.  PROCEDURE:  Right below-knee amputation.  SURGEON:  Wylene Simmer, MD  ANESTHESIA:  General.  ESTIMATED BLOOD LOSS:  500 mL.  TOURNIQUET TIME:  2 minutes at 300 mmHg.  COMPLICATIONS:  None apparent.  DISPOSITION:  Extubated, awake and stable to recovery.  INDICATIONS FOR PROCEDURE:  The patient is an 80 year old male with a past medical history significant for diabetes.  He is status post left below-knee amputation.  He has been admitted at this time for a large wound on the right leg with necrosis and exposed fibula.  He has failed nonoperative treatment at the [place].  He was admitted with increasing drainage and an elevated white blood cell count.  He presents now for operative treatment of this condition.  He understands the risks and benefits, the alternative treatment options.  His daughter who is power-of-attorney, understands specifically the risks of bleeding, infection, nerve damage, blood clots, need for additional surgery, failure to heal, revision amputation, and death.  She elects to proceed with below-knee amputation as the best treatment option for him at this time.  PROCEDURE IN DETAIL:  After preoperative consent was obtained and the correct operative site was identified, the patient was brought to the operating room and placed supine on the operating table.  General anesthesia was induced.  The patient was already on therapeutic antibiotics.  A surgical time-out was taken.  The right lower extremity was prepped and  draped in standard sterile fashion with a tourniquet around the thigh.  The extremity was exsanguinated and the tourniquet was inflated to 300 mmHg.  A posterior flap incision was marked on the skin.  The incision was made and it was immediately evident that the tourniquet was ineffective and was blocking only venous outflow.  The tourniquet was released.  The incision was completed down to the level of the tibial periosteum.  Hemostats were placed on the large bleeding vessels.  The tibial periosteum was elevated and a reciprocating saw was used to cut through the tibia.  The fibula was then isolated and similarly cut about 2 cm proximal from the tibial cut.  An amputation knife was then used to cut through the posterior soft tissues creating a long posterior flap.  All the named neurovascular bundles were clamped with hemostats.  All the named nerves were placed on gentle traction and cut with a scalpel.  They were allowed to retract into the soft tissues. All the named vessels were doubly suture ligated.  Due to the extreme calcification in some of the vessels, silk suture was insufficient.  As a result, vascular clips were used to clip these vessels off.  The posterior flap was contoured appropriately.  The wound was irrigated copiously.  The gastrocnemius fascia was then repaired to the anterior fascia and periosteum with figure-of-eight sutures of 0 PDS. Subcutaneous tissue was approximated with inverted simple sutures of 3-0 Monocryl, and horizontal mattress sutures of 2-0 nylon were used to close the skin incision.  Sterile dressings were applied followed by well-padded compression wrap and knee immobilizer.  The patient was awakened from anesthesia and transported to the recovery room in stable condition.  FOLLOWUP PLAN:  The patient will be readmitted to the Hospitalist Service under the care of Dr. Eliseo Squires.  He will have physical therapy and occupational therapy evaluations.  He  can resume DVT prophylaxis tomorrow.     Wylene Simmer, MD     JH/MEDQ  D:  12/22/2013  T:  12/23/2013  Job:  [id number]

## 2013-12-23 NOTE — Progress Notes (Addendum)
INFECTIOUS DISEASE PROGRESS NOTE  ID: Jesse Chandler is a 80 y.o. male with  Principal Problem:   Infected pressure ulcer Active Problems:   Paroxysmal a-fib   Hypothyroidism   Acute kidney injury   Diabetic foot ulcer   S/P unilateral L. BKA    Diabetic ulcer of ankle  Subjective: Without complaints  Abtx:  Anti-infectives   Start     Dose/Rate Route Frequency Ordered Stop   12/21/13 1600  vancomycin (VANCOCIN) 1,500 mg in sodium chloride 0.9 % 500 mL IVPB     1,500 mg 250 mL/hr over 120 Minutes Intravenous Every 24 hours 12/20/13 1747     12/21/13 0200  meropenem (MERREM) 1 g in sodium chloride 0.9 % 100 mL IVPB     1 g 200 mL/hr over 30 Minutes Intravenous Every 8 hours 12/20/13 1747     12/20/13 1715  meropenem (MERREM) 1 g in sodium chloride 0.9 % 100 mL IVPB     1 g 200 mL/hr over 30 Minutes Intravenous NOW 12/20/13 1711 12/20/13 1840   12/20/13 1445  vancomycin (VANCOCIN) 2,000 mg in sodium chloride 0.9 % 500 mL IVPB     2,000 mg 250 mL/hr over 120 Minutes Intravenous  Once 12/20/13 1427 12/20/13 1805      Medications:  Scheduled: . aspirin  81 mg Oral Daily  . B-complex with vitamin C  1 tablet Oral Daily  . calcium-vitamin D  1 tablet Oral BID  . diltiazem  240 mg Oral Daily  . enoxaparin (LOVENOX) injection  40 mg Subcutaneous Q24H  . enzalutamide  160 mg Oral QPC supper  . feeding supplement (GLUCERNA SHAKE)  237 mL Oral TID BM  . feeding supplement (PRO-STAT SUGAR FREE 64)  30 mL Oral Daily  . folic acid  1 mg Oral Daily  . furosemide  60 mg Oral BID  . guaiFENesin  1,200 mg Oral BID  . influenza vac split quadrivalent PF  0.5 mL Intramuscular Tomorrow-1000  . insulin aspart  0-15 Units Subcutaneous TID WC  . insulin aspart  0-5 Units Subcutaneous QHS  . [START ON 12/24/2013] insulin glargine  5 Units Subcutaneous Daily  . levothyroxine  50 mcg Oral QAC breakfast  . meropenem (MERREM) IV  1 g Intravenous Q8H  . multivitamin with minerals  1 tablet  Oral Daily  . nutrition supplement (JUVEN)  1 packet Oral BID BM  . pantoprazole  40 mg Oral Daily  . pneumococcal 23 valent vaccine  0.5 mL Intramuscular Tomorrow-1000  . polyethylene glycol  17 g Oral Daily  . potassium chloride SA  40 mEq Oral BID  . saccharomyces boulardii  250 mg Oral BID  . vancomycin  1,500 mg Intravenous Q24H  . vitamin C  500 mg Oral Daily    Objective: Vital signs in last 24 hours: Temp:  [96.8 F (36 C)-98.7 F (37.1 C)] 98.7 F (37.1 C) (03/28 5284) Pulse Rate:  [64-85] 75 (03/28 0633) Resp:  [10-18] 18 (03/28 0633) BP: (100-113)/(35-69) 110/46 mmHg (03/28 0916) SpO2:  [97 %-100 %] 99 % (03/28 1324)   General appearance: alert, cooperative and no distress Resp: clear to auscultation bilaterally Cardio: regular rate and rhythm GI: normal findings: bowel sounds normal and soft, non-tender Incision/Wound: RLE wrapped. LLE stump is clean.   Lab Results  Recent Labs  12/21/13 0440 12/23/13 0345  WBC 12.2* 10.4  HGB 7.6* 6.2*  HCT 25.5* 19.5*  NA 137 136*  K 4.7 4.1  CL 106  103  CO2 19 22  BUN 30* 19  CREATININE 1.13 0.90   Liver Panel No results found for this basename: PROT, ALBUMIN, AST, ALT, ALKPHOS, BILITOT, BILIDIR, IBILI,  in the last 72 hours Sedimentation Rate No results found for this basename: ESRSEDRATE,  in the last 72 hours C-Reactive Protein No results found for this basename: CRP,  in the last 72 hours  Microbiology: Recent Results (from the past 240 hour(s))  CULTURE, BLOOD (ROUTINE X 2)     Status: None   Collection Time    12/20/13 12:48 PM      Result Value Ref Range Status   Specimen Description BLOOD RIGHT ANTECUBITAL   Final   Special Requests BOTTLES DRAWN AEROBIC AND ANAEROBIC 5ML   Final   Culture  Setup Time     Final   Value: 12/20/2013 16:03     Performed at [facility]   Culture     Final   Value:        BLOOD CULTURE RECEIVED NO GROWTH TO DATE CULTURE WILL BE HELD FOR 5 DAYS BEFORE ISSUING A  FINAL NEGATIVE REPORT     Performed at [facility]   Report Status PENDING   Incomplete  CULTURE, BLOOD (ROUTINE X 2)     Status: None   Collection Time    12/20/13 12:48 PM      Result Value Ref Range Status   Specimen Description BLOOD LEFT ANTECUBITAL   Final   Special Requests BOTTLES DRAWN AEROBIC AND ANAEROBIC 5ML   Final   Culture  Setup Time     Final   Value: 12/20/2013 16:02     Performed at [facility]   Culture     Final   Value:        BLOOD CULTURE RECEIVED NO GROWTH TO DATE CULTURE WILL BE HELD FOR 5 DAYS BEFORE ISSUING A FINAL NEGATIVE REPORT     Performed at [facility]   Report Status PENDING   Incomplete  WOUND CULTURE     Status: None   Collection Time    12/20/13  2:42 PM      Result Value Ref Range Status   Specimen Description WOUND   Final   Special Requests Normal   Final   Gram Stain     Final   Value: NO WBC SEEN     NO SQUAMOUS EPITHELIAL CELLS SEEN     RARE GRAM POSITIVE COCCI IN PAIRS     RARE GRAM NEGATIVE RODS     Performed at [facility]   Culture     Final   Value: MULTIPLE ORGANISMS PRESENT, NONE PREDOMINANT     Note: NO STAPHYLOCOCCUS AUREUS ISOLATED NO GROUP A STREP (S.PYOGENES) ISOLATED     Performed at [facility]   Report Status 12/23/2013 FINAL   Final  URINE CULTURE     Status: None   Collection Time    12/20/13  3:20 PM      Result Value Ref Range Status   Specimen Description URINE, SUPRAPUBIC   Final   Special Requests NONE   Final   Culture  Setup Time     Final   Value: 12/21/2013 06:06     Performed at [place]     Final   Value: NO GROWTH     Performed at [facility]   Culture     Final   Value: NO  GROWTH     Performed at [facility]   Report Status 12/22/2013 FINAL   Final  CLOSTRIDIUM DIFFICILE BY PCR     Status: None   Collection Time    12/20/13  3:20 PM      Result Value Ref Range Status   C difficile by pcr NEGATIVE   NEGATIVE Final   Comment: Performed at [facility]  SURGICAL PCR SCREEN     Status: None   Collection Time    12/22/13  2:23 AM      Result Value Ref Range Status   MRSA, PCR NEGATIVE  NEGATIVE Final   Staphylococcus aureus NEGATIVE  NEGATIVE Final   Comment:            The Xpert SA Assay (FDA     approved for NASAL specimens     in patients over 58 years of age),     is one component of     a comprehensive surveillance     program.  Test performance has     been validated by [employer] for patients greater     than or equal to 86 year old.     It is not intended     to diagnose infection nor to     guide or monitor treatment.    Studies/Results: Dg Chest Port 1 View  12/21/2013   CLINICAL DATA:  Confirm line placement.  EXAM: PORTABLE CHEST - 1 VIEW  COMPARISON:  DG CHEST 1V PORT dated 12/20/2013  FINDINGS: Interval placement of left peripherally inserted central venous catheter with distal tip projecting in proximal to mid superior vena cava.  The cardiac silhouette appears at least mildly enlarged, similar. Mediastinal silhouette is nonsuspicious. Right greater than left lung base airspace opacities persist with small right pleural effusion. Similarly overall increased lung volumes. No pneumothorax. Soft tissue planes and included osseous structures are nonsuspicious.  IMPRESSION: Left PICC distal tip projects in proximal to mid superior vena cava, no pneumothorax.  Stable cardiomegaly, bibasilar airspace opacities and suspected small right pleural effusion in a background of COPD.   Electronically Signed   By: Elon Alas   On: 12/21/2013 20:01     Assessment/Plan: Infected Decubitus Ulcer   R BKA 12-22-13 Sacral decubitus ulcer (stage II) DM  CKD stage III Metastatic Prostate Disease  Severe protein calorie malnutrition Jehovah's Witness Severe Anemia  Could consider that his infection (R leg) has been cleared Would give him 2 total weeks of anbx post  amputation.  Pt/ot eval, rehab? Aranesp? Does he need ASA while he is this anemic? WOC f/u Encourage nutrition Available if questions.   Total days of antibiotics: 4 (vanco/imipenem)         Bobby Rumpf Infectious Diseases (pager) [phone] [email or web address] 12/23/2013, 2:35 PM  LOS: 3 days

## 2013-12-23 NOTE — Progress Notes (Signed)
  Echocardiogram 2D Echocardiogram has been performed.  Jesse Chandler 12/23/2013, 12:31 PM

## 2013-12-23 NOTE — Evaluation (Signed)
Physical Therapy Evaluation Patient Details Name: Jesse Chandler MRN: [id number] DOB: 08/13/1924 Today's Date: 12/23/2013   History of Present Illness  Pt presents with nonhealing wound RLE and underwent right BKA. 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  Clinical Impression  Pt lethargic and confused on eval as well as very fearful of falling in setting of poor body and spatial awareness. Requires +2 max A to achieve and maintain sitting EOB. Goals at this point are for pt to tolerate sitting more promoting breathing, digestion, and muscular activation as well as decreased burden of care next venue. Recommend SNF at d/c. PT will follow.    Follow Up Recommendations SNF;Supervision/Assistance - 24 hour    Equipment Recommendations  None recommended by PT    Recommendations for Other Services       Precautions / Restrictions Precautions Precautions: Fall Required Braces or Orthoses: Knee Immobilizer - Right Restrictions Weight Bearing Restrictions: Yes RLE Weight Bearing: Non weight bearing      Mobility  Bed Mobility Overal bed mobility: +2 for physical assistance;Needs Assistance Bed Mobility: Supine to Sit;Sit to Supine     Supine to sit: +2 for physical assistance;Max assist Sit to supine: +2 for physical assistance;Max assist   General bed mobility comments: pt fearful of falling so resisting bed mobility at times. Partially rolled, then pivoted to left. vc's for sequencing and hand placement, pt requiring hand over hand cues to reach for rail and participate in sitting up. Requiring +2 max A to get to midline and once in midline, pt feeling that he is going to fall to right. Pt tot A to return to supine position and scoot up in bed  Transfers                 General transfer comment: will require lift for transfers at this point  Ambulation/Gait             General Gait Details: NA  Stairs            Wheelchair  Mobility    Modified Rankin (Stroke Patients Only)       Balance Overall balance assessment: Needs assistance Sitting-balance support: Bilateral upper extremity supported;Feet unsupported Sitting balance-Leahy Scale: Zero Sitting balance - Comments: strong left lean, required max A from behind and on left side to maintain sitting EOB x10 mins. Attempted wt-shifting but pt felt as if he was falling. Attempted to get pt to use UE's to help hold self up but pt with minimal use of UE's. Unsure if this is due to weakness or lethargy today. Postural control: Posterior lean;Left lateral lean                           Pertinent Vitals/Pain VSS    Home Living Family/patient expects to be discharged to:: Skilled nursing facility                 Additional Comments: pt reports he lived at [facility]    Prior Function Level of Independence: Needs assistance   Gait / Transfers Assistance Needed: unable to ambulate, unsure whether he was performing transfers  ADL's / Homemaking Assistance Needed: assistance from NH staff  Comments: pt lethargic and also with dementia so difficult to get clear history and family member in room does not seem to know all history either     Hand Dominance  Extremity/Trunk Assessment   Upper Extremity Assessment: Generalized weakness;Defer to OT evaluation           Lower Extremity Assessment: RLE deficits/detail;LLE deficits/detail RLE Deficits / Details: unable to move leg, pain with passive motion LLE Deficits / Details: able to flex left hip against gravity, old BKA  Cervical / Trunk Assessment: Normal  Communication   Communication: HOH  Cognition Arousal/Alertness: Lethargic Behavior During Therapy: Flat affect;Anxious Overall Cognitive Status: Impaired/Different from baseline (unsure of baseline but meds seem to be affecting now) Area of Impairment: Following commands;Problem solving;Memory     Memory: Decreased  short-term memory Following Commands: Follows one step commands inconsistently;Follows one step commands with increased time     Problem Solving: Slow processing;Decreased initiation;Requires verbal cues;Requires tactile cues General Comments: decreased body awareness, decreased proprioception, fearful of falling    General Comments      Exercises        Assessment/Plan    PT Assessment Patient needs continued PT services  PT Diagnosis Generalized weakness;Acute pain   PT Problem List Decreased strength;Decreased range of motion;Decreased activity tolerance;Decreased balance;Decreased mobility;Decreased coordination;Decreased cognition;Pain  PT Treatment Interventions Functional mobility training;Therapeutic activities;Therapeutic exercise;Balance training;Neuromuscular re-education;Cognitive remediation;Patient/family education   PT Goals (Current goals can be found in the Care Plan section) Acute Rehab PT Goals Patient Stated Goal: none stated PT Goal Formulation: With family Time For Goal Achievement: 01/06/14 Potential to Achieve Goals: Fair    Frequency Min 3X/week   Barriers to discharge        End of Session Equipment Utilized During Treatment: Right knee immobilizer Activity Tolerance: Patient limited by lethargy Patient left: in bed;with call bell/phone within reach;with family/visitor present         Time: 1350-1413 PT Time Calculation (min): 23 min   Charges:   PT Evaluation $Initial PT Evaluation Tier I: 1 Procedure PT Treatments $Therapeutic Activity: 8-22 mins   PT G Codes:         Leighton Roach, PT  Acute Rehab Services  [phone]  Leighton Roach 12/23/2013, 2:56 PM

## 2013-12-24 DIAGNOSIS — D649 Anemia, unspecified: Secondary | ICD-10-CM

## 2013-12-24 LAB — CBC
HCT: 19 % — ABNORMAL LOW (ref 39.0–52.0)
HEMOGLOBIN: 6.1 g/dL — AB (ref 13.0–17.0)
MCH: 29.8 pg (ref 26.0–34.0)
MCHC: 32.1 g/dL (ref 30.0–36.0)
MCV: 92.7 fL (ref 78.0–100.0)
Platelets: 213 10*3/uL (ref 150–400)
RBC: 2.05 MIL/uL — ABNORMAL LOW (ref 4.22–5.81)
RDW: 16.6 % — AB (ref 11.5–15.5)
WBC: 13.9 10*3/uL — ABNORMAL HIGH (ref 4.0–10.5)

## 2013-12-24 LAB — GLUCOSE, CAPILLARY
GLUCOSE-CAPILLARY: 127 mg/dL — AB (ref 70–99)
Glucose-Capillary: 85 mg/dL (ref 70–99)
Glucose-Capillary: 152 mg/dL — ABNORMAL HIGH (ref 70–99)
Glucose-Capillary: 107 mg/dL — ABNORMAL HIGH (ref 70–99)

## 2013-12-24 LAB — BASIC METABOLIC PANEL
BUN: 25 mg/dL — AB (ref 6–23)
CALCIUM: 8.7 mg/dL (ref 8.4–10.5)
CO2: 22 mEq/L (ref 19–32)
Chloride: 106 mEq/L (ref 96–112)
Creatinine, Ser: 0.92 mg/dL (ref 0.50–1.35)
GFR, EST AFRICAN AMERICAN: 85 mL/min — AB (ref >90)
GFR, EST NON AFRICAN AMERICAN: 73 mL/min — AB (ref >90)
GLUCOSE: 88 mg/dL (ref 70–99)
Potassium: 5.4 mEq/L — ABNORMAL HIGH (ref 3.7–5.3)
Sodium: 136 mEq/L — ABNORMAL LOW (ref 137–147)

## 2013-12-24 MED ORDER — MORPHINE SULFATE 2 MG/ML IJ SOLN
1.0000 mg | INTRAMUSCULAR | Status: DC | PRN
  Administered 2013-12-25: 1 mg via INTRAVENOUS
  Filled 2013-12-24: qty 1

## 2013-12-24 MED ORDER — FUROSEMIDE 40 MG PO TABS
40.0000 mg | ORAL_TABLET | Freq: Every day | ORAL | Status: DC
  Administered 2013-12-24 – 2013-12-27 (×4): 40 mg via ORAL
  Filled 2013-12-24 (×4): qty 1

## 2013-12-24 MED ORDER — FUROSEMIDE 40 MG PO TABS
40.0000 mg | ORAL_TABLET | Freq: Two times a day (BID) | ORAL | Status: DC
  Filled 2013-12-24 (×2): qty 1

## 2013-12-24 NOTE — Progress Notes (Addendum)
PATIENT IS  JEHOVAH'S WITNESS- DOES NOT TAKE BLOOD PRODUCTS                                            Patient Demographics  Jesse Chandler, is a 80 y.o. male, DOB - Mar 29, 1924, [id number]  Admit date - 12/20/2013   Admitting Physician Thurnell Lose, MD  Outpatient Primary MD for the patient is [place], [place]  LOS - 4   Chief Complaint  Patient presents with  . wound, pressure ulcers         Assessment & Plan    Right leg diabetic ulcer suspicious for osteomyelitis -  -blood cultures obtained- NGTD -wound culture obtained- NGTD -vancomycin and meropenem- appreciate ID -consult infectious disease physician Dr. Johnnye Sima and Dr. Doran Durand orthopedics:  BKA on 12/22/2013.  Stage II sacral decubitus ulcer. Resulted wound care.     Mild diarrhea. Likely due to being on 3 different antibiotics including Augmentin, placed on probiotic and ruled out C. Difficile.   Hyperkalemia -d/c supplement -resume lasix at lower dose tofay   Diabetes mellitus type 2. In poor control, checked A1c, place have lowered the started Lantus dose, continue sliding scale insulin.    History of paroxysmal atrial fibrillation. Goal rate control, place on low-dose beta blocker, Not on anticoagulation. D/c tele  Hypothyroidism continue home Synthroid.    CKD stage III. Currently appears to be at baseline from 1.3.  -resume lasix   History of prostate cancer: Status post suprapubic catheter   Left bundle branch block - unknown chronicity, previous EKG shows partial block, no chest pain, serial troponin negative -echo shows diastolic dysfunction    Anemia - does not take blood products -got arasnep 18th of March at cancer center in [place] ON CHART Poor overall prognosis  Code Status: DNR  Family Communication: daughter at bedside  Disposition Plan:  SNF   Procedures  Echo   Consults ID Dr. Johnnye Sima, orthopedics Dr. Doran Durand   Medications  Scheduled Meds: . B-complex with vitamin C  1 tablet Oral Daily  . calcium-vitamin D  1 tablet Oral BID  . diltiazem  240 mg Oral Daily  . feeding supplement (GLUCERNA SHAKE)  237 mL Oral TID BM  . feeding supplement (PRO-STAT SUGAR FREE 64)  30 mL Oral Daily  . folic acid  1 mg Oral Daily  . furosemide  40 mg Oral BID  . guaiFENesin  1,200 mg Oral BID  . influenza vac split quadrivalent PF  0.5 mL Intramuscular Tomorrow-1000  . insulin aspart  0-15 Units Subcutaneous TID WC  . insulin aspart  0-5 Units Subcutaneous QHS  . insulin glargine  5 Units Subcutaneous Daily  . levothyroxine  50 mcg Oral QAC breakfast  . meropenem (MERREM) IV  1 g Intravenous Q8H  . multivitamin with minerals  1 tablet Oral Daily  . nutrition supplement (JUVEN)  1 packet Oral BID BM  . pantoprazole  40 mg Oral Daily  . pneumococcal 23 valent vaccine  0.5 mL Intramuscular Tomorrow-1000  . polyethylene glycol  17 g Oral Daily  . saccharomyces boulardii  250 mg Oral BID  . vancomycin  1,500 mg Intravenous Q24H  . vitamin C  500 mg Oral Daily   Continuous Infusions: . lactated ringers 20 mL/hr at 12/22/13 1613   PRN Meds:.guaiFENesin-dextromethorphan, HYDROcodone-acetaminophen, ipratropium-albuterol, loperamide, magnesium hydroxide, morphine injection, ondansetron (ZOFRAN)  IV, ondansetron, sodium chloride  DVT Prophylaxis  Heparin   Lab Results  Component Value Date   PLT 213 12/24/2013    Antibiotics    Anti-infectives   Start     Dose/Rate Route Frequency Ordered Stop   12/21/13 1600  vancomycin (VANCOCIN) 1,500 mg in sodium chloride 0.9 % 500 mL IVPB     1,500 mg 250 mL/hr over 120 Minutes Intravenous Every 24 hours 12/20/13 1747     12/21/13 0200  meropenem (MERREM) 1 g in sodium chloride 0.9 % 100 mL IVPB     1 g 200 mL/hr over 30 Minutes Intravenous Every 8 hours 12/20/13 1747     12/20/13 1715   meropenem (MERREM) 1 g in sodium chloride 0.9 % 100 mL IVPB     1 g 200 mL/hr over 30 Minutes Intravenous NOW 12/20/13 1711 12/20/13 1840   12/20/13 1445  vancomycin (VANCOCIN) 2,000 mg in sodium chloride 0.9 % 500 mL IVPB     2,000 mg 250 mL/hr over 120 Minutes Intravenous  Once 12/20/13 1427 12/20/13 1805          Subjective:   Jesse Chandler no overnight events  Objective:   Filed Vitals:   12/23/13 1443 12/23/13 2200 12/24/13 0159 12/24/13 0637  BP: 106/39 136/42  129/35  Pulse: 80 75  76  Temp: 98.2 F (36.8 C) 97.8 F (36.6 C)  97.9 F (36.6 C)  TempSrc:    Oral  Resp: 18 16  18   Height:      Weight:      SpO2: 100% 98% 97% 100%    Wt Readings from Last 3 Encounters:  12/21/13 102.4 kg (225 lb 12 oz)  12/21/13 102.4 kg (225 lb 12 oz)  05/31/13 97.523 kg (215 lb)     Intake/Output Summary (Last 24 hours) at 12/24/13 0904 Last data filed at 12/24/13 0640  Gross per 24 hour  Intake 3393.75 ml  Output   1151 ml  Net 2242.75 ml     Physical Exam  Awake Alert, Oriented X 3 Symmetrical Chest wall movement, Good air movement bilaterally, CTAB RRR,No Gallops,Rubs or new Murmurs +ve B.Sounds, Abd Soft, Non tender, No organomegaly appriciated, No rebound - guarding or rigidity. Suprapubic catheter in place left BKA, right leg ulcer under bandage.   Data Review   Micro Results Recent Results (from the past 240 hour(s))  CULTURE, BLOOD (ROUTINE X 2)     Status: None   Collection Time    12/20/13 12:48 PM      Result Value Ref Range Status   Specimen Description BLOOD RIGHT ANTECUBITAL   Final   Special Requests BOTTLES DRAWN AEROBIC AND ANAEROBIC 5ML   Final   Culture  Setup Time     Final   Value: 12/20/2013 16:03     Performed at [facility]   Culture     Final   Value:        BLOOD CULTURE RECEIVED NO GROWTH TO DATE CULTURE WILL BE HELD FOR 5 DAYS BEFORE ISSUING A FINAL NEGATIVE REPORT     Performed at [facility]   Report Status  PENDING   Incomplete  CULTURE, BLOOD (ROUTINE X 2)     Status: None   Collection Time    12/20/13 12:48 PM      Result Value Ref Range Status   Specimen Description BLOOD LEFT ANTECUBITAL   Final   Special Requests BOTTLES DRAWN AEROBIC AND ANAEROBIC 5ML   Final  Culture  Setup Time     Final   Value: 12/20/2013 16:02     Performed at [facility]   Culture     Final   Value:        BLOOD CULTURE RECEIVED NO GROWTH TO DATE CULTURE WILL BE HELD FOR 5 DAYS BEFORE ISSUING A FINAL NEGATIVE REPORT     Performed at [facility]   Report Status PENDING   Incomplete  WOUND CULTURE     Status: None   Collection Time    12/20/13  2:42 PM      Result Value Ref Range Status   Specimen Description WOUND   Final   Special Requests Normal   Final   Gram Stain     Final   Value: NO WBC SEEN     NO SQUAMOUS EPITHELIAL CELLS SEEN     RARE GRAM POSITIVE COCCI IN PAIRS     RARE GRAM NEGATIVE RODS     Performed at [facility]   Culture     Final   Value: MULTIPLE ORGANISMS PRESENT, NONE PREDOMINANT     Note: NO STAPHYLOCOCCUS AUREUS ISOLATED NO GROUP A STREP (S.PYOGENES) ISOLATED     Performed at [facility]   Report Status 12/23/2013 FINAL   Final  URINE CULTURE     Status: None   Collection Time    12/20/13  3:20 PM      Result Value Ref Range Status   Specimen Description URINE, SUPRAPUBIC   Final   Special Requests NONE   Final   Culture  Setup Time     Final   Value: 12/21/2013 06:06     Performed at [facility]     Final   Value: NO GROWTH     Performed at [facility]   Culture     Final   Value: NO GROWTH     Performed at [facility]   Report Status 12/22/2013 FINAL   Final  CLOSTRIDIUM DIFFICILE BY PCR     Status: None   Collection Time    12/20/13  3:20 PM      Result Value Ref Range Status   C difficile by pcr NEGATIVE  NEGATIVE Final   Comment: Performed at [facility]  SURGICAL PCR SCREEN      Status: None   Collection Time    12/22/13  2:23 AM      Result Value Ref Range Status   MRSA, PCR NEGATIVE  NEGATIVE Final   Staphylococcus aureus NEGATIVE  NEGATIVE Final   Comment:            The Xpert SA Assay (FDA     approved for NASAL specimens     in patients over 42 years of age),     is one component of     a comprehensive surveillance     program.  Test performance has     been validated by [employer] for patients greater     than or equal to 29 year old.     It is not intended     to diagnose infection nor to     guide or monitor treatment.    Radiology Reports Dg Chest Port 1 View  12/20/2013   CLINICAL DATA:  Fever, atrial fibrillation  EXAM: PORTABLE CHEST - 1 VIEW  COMPARISON:  12/14/2013  FINDINGS: Background hyperinflation compatible with COPD/  emphysema. Similar small pleural effusions persist with compressive basilar atelectasis. No significant interval change. Upper lobes remain clear. No pneumothorax. Trachea is midline.  IMPRESSION: COPD/ emphysema  Similar small effusions with basilar compressive atelectasis   Electronically Signed   By: Daryll Brod M.D.   On: 12/20/2013 15:56    CBC  Recent Labs Lab 12/20/13 1248 12/21/13 0440 12/23/13 0345 12/23/13 2000 12/24/13 0619  WBC 17.1* 12.2* 10.4 14.1* 13.9*  HGB 8.7* 7.6* 6.2* 6.3* 6.1*  HCT 27.9* 25.5* 19.5* 19.7* 19.0*  PLT 287 262 206 213 213  MCV 91.5 92.4 92.9 92.9 92.7  MCH 28.5 27.5 29.5 29.7 29.8  MCHC 31.2 29.8* 31.8 32.0 32.1  RDW 15.5 15.8* 16.4* 16.7* 16.6*    Chemistries   Recent Labs Lab 12/20/13 1248 12/21/13 0440 12/23/13 0345 12/24/13 0619  NA 138 137 136* 136*  K 4.9 4.7 4.1 5.4*  CL 105 106 103 106  CO2 21 19 22 22   GLUCOSE 222* 123* 133* 88  BUN 34* 30* 19 25*  CREATININE 1.21 1.13 0.90 0.92  CALCIUM 9.7 9.2 8.3* 8.7  AST 10  --   --   --   ALT <5  --   --   --   ALKPHOS 115  --   --   --   BILITOT <0.2*  --   --   --     ------------------------------------------------------------------------------------------------------------------ estimated creatinine clearance is 69.8 ml/min (by C-G formula based on Cr of 0.92). ------------------------------------------------------------------------------------------------------------------ No results found for this basename: HGBA1C,  in the last 72 hours ------------------------------------------------------------------------------------------------------------------ No results found for this basename: CHOL, HDL, LDLCALC, TRIG, CHOLHDL, LDLDIRECT,  in the last 72 hours ------------------------------------------------------------------------------------------------------------------ No results found for this basename: TSH, T4TOTAL, FREET3, T3FREE, THYROIDAB,  in the last 72 hours ------------------------------------------------------------------------------------------------------------------  Recent Labs  12/21/13 1130  VITAMINB12 1227*  FOLATE >20.0  FERRITIN 290  TIBC 176*  IRON 50  RETICCTPCT 3.5*    Coagulation profile No results found for this basename: INR, PROTIME,  in the last 168 hours  No results found for this basename: DDIMER,  in the last 72 hours  Cardiac Enzymes  Recent Labs Lab 12/21/13 1130 12/21/13 2250 12/22/13 0552  TROPONINI <0.30 <0.30 <0.30   ------------------------------------------------------------------------------------------------------------------ No components found with this basename: POCBNP,      Time Spent in minutes  35   Esequiel Kleinfelter  DO on 12/24/2013 at 9:04 AM  [phone]

## 2013-12-24 NOTE — Progress Notes (Addendum)
Clinical Social Work Department CLINICAL SOCIAL WORK PLACEMENT NOTE 12/24/2013  Patient:  Jesse Chandler, Jesse Chandler  Account Number:  [id number] Admit date:  12/20/2013  Clinical Social Worker:  Blima Rich, Latanya Presser  Date/time:  12/24/2013 07:17 PM  Clinical Social Work is seeking post-discharge placement for this patient at the following level of care:   [place]   (*CSW will update this form in Epic as items are completed)   12/24/2013  Patient/family provided with [facility] [place] Department of Clinical Social Work's list of facilities offering this level of care within the geographic area requested by the patient (or if unable, by the patient's family).  12/24/2013  Patient/family informed of their freedom to choose among providers that offer the needed level of care, that participate in Medicare, Medicaid or managed care program needed by the patient, have an available bed and are willing to accept the patient.  12/24/2013  Patient/family informed of [facility]' ownership interest in [facility], as well as of the fact that they are under no obligation to receive care at this facility.  PASARR submitted to EDS on 03/07/2009 PASARR number received from EDS on 03/07/2009  FL2 transmitted to all facilities in geographic area requested by pt/family on  12/24/2013 FL2 transmitted to all facilities within larger geographic area on   Patient informed that his/her managed care company has contracts with or will negotiate with  certain facilities, including the following:     Patient/family informed of bed offers received:  12/25/2013 Patient chooses bed at [facility]  Physician recommends and patient chooses bed at    Patient to be transferred to  [place]  on 12/27/2013  Patient to be transferred to facility by [facility]  The following physician request were entered in Epic:   Additional Comments:

## 2013-12-24 NOTE — Progress Notes (Signed)
Clinical Social Work Department BRIEF PSYCHOSOCIAL ASSESSMENT 12/24/2013  Patient:  Jesse Chandler, Jesse Chandler     Account Number:  [id number]     Admit date:  12/20/2013  Clinical Social Worker:  Rolinda Roan  Date/Time:  12/24/2013 07:10 PM  Referred by:  Physician  Date Referred:  12/22/2013 Referred for  SNF Placement   Other Referral:   Interview type:  Family Other interview type:    PSYCHOSOCIAL DATA Living Status:  FACILITY Admitted from facility:  [place], [place] Level of care:  [place] Primary support name:  Domingo Dimes Primary support relationship to patient:  CHILD, ADULT Degree of support available:   Very supportive at bedside.    CURRENT CONCERNS  Other Concerns:    SOCIAL WORK ASSESSMENT / PLAN Clinical Social Worker (CSW) met with patient and his daughter Naomie Dean at bedside. Daughter reported that patient is a long term care resident at [facility] in [place] and has been for about a year now. Daughter reported that she is interested in looking into other long term care facilities in [place] but understands that long term care beds are limited and is okay with patient going back to [facility]. Daughter reported that she was upset that the nursing facility did not notify her about how bad the patient's wound was. Daughter reported that she is disappointed in that but likes the facility overall.   Assessment/plan status:  Psychosocial Support/Ongoing Assessment of Needs Other assessment/ plan:   Information/referral to community resources:    PATIENT'S/FAMILY'S RESPONSE TO PLAN OF CARE: CSW provided emotional support as daughter talked about how she is the only child out of 12 children that visits her father in the nursing home. Daughter reported that she goes to the nursing home 3 to 4 days a week and cares about the patient a lot. CSW encouraged daughter to take care of herself as well as look after the patient.

## 2013-12-24 NOTE — Progress Notes (Signed)
Subjective: 2 Days Post-Op Procedure(s) (LRB): AMPUTATION BELOW KNEE (Right) Patient reports pain as 2 on 0-10 scale. [facility] here to remove cast and treat.   Objective: Vital signs in last 24 hours: Temp:  [97.8 F (36.6 C)-98.2 F (36.8 C)] 97.9 F (36.6 C) (03/29 3354) Pulse Rate:  [75-80] 76 (03/29 0637) Resp:  [16-18] 18 (03/29 0637) BP: (106-136)/(35-46) 129/35 mmHg (03/29 0637) SpO2:  [97 %-100 %] 100 % (03/29 0637)  Intake/Output from previous day: 03/28 0701 - 03/29 0700 In: 3633.8 [P.O.:840; I.V.:1293.8; IV Piggyback:1500] Out: 1151 [Urine:1150; Stool:1] Intake/Output this shift:     Recent Labs  12/23/13 0345 12/23/13 2000 12/24/13 0619  HGB 6.2* 6.3* 6.1*    Recent Labs  12/23/13 2000 12/24/13 0619  WBC 14.1* 13.9*  RBC 2.12* 2.05*  HCT 19.7* 19.0*  PLT 213 213    Recent Labs  12/23/13 0345 12/24/13 0619  NA 136* 136*  K 4.1 5.4*  CL 103 106  CO2 22 22  BUN 19 25*  CREATININE 0.90 0.92  GLUCOSE 133* 88  CALCIUM 8.3* 8.7   No results found for this basename: LABPT, INR,  in the last 72 hours  Hbg 6.1 Being seen by [facility].  Assessment/Plan: 2 Days Post-Op Procedure(s) (LRB): AMPUTATION BELOW KNEE (Right) Up with therapy  Jeanluc Wegman A 12/24/2013, 8:21 AM

## 2013-12-25 DIAGNOSIS — L97309 Non-pressure chronic ulcer of unspecified ankle with unspecified severity: Secondary | ICD-10-CM

## 2013-12-25 LAB — GLUCOSE, CAPILLARY
Glucose-Capillary: 91 mg/dL (ref 70–99)
Glucose-Capillary: 120 mg/dL — ABNORMAL HIGH (ref 70–99)
Glucose-Capillary: 107 mg/dL — ABNORMAL HIGH (ref 70–99)
Glucose-Capillary: 109 mg/dL — ABNORMAL HIGH (ref 70–99)

## 2013-12-25 MED ORDER — GUAIFENESIN ER 600 MG PO TB12
1200.0000 mg | ORAL_TABLET | Freq: Two times a day (BID) | ORAL | Status: DC | PRN
  Filled 2013-12-25: qty 2

## 2013-12-25 NOTE — Progress Notes (Signed)
[facility]  NAME:  Jesse Chandler, Jesse Chandler                     ACCOUNT NO.:  MEDICAL RECORD NO.:  [id number]      DATE OF BIRTH:  Oct 31, 1923  PHYSICIAN:  Judene Companion, M.D.           VISIT DATE:                                  OFFICE VISIT   He is an 80 year old, African American male who was been here before, after he had a left below-knee amputation, and he developed slowly healing wound that we attended and finally got it to heal.  Later, he had a very small pressure ulcer on his right leg, which he returns for today with a severe infected pressure ulcer on the whole lateral side of his right leg which has all of the lateral tendons all exposed.  It is a very moist wound with infection that I debrided and cut away all the necrotic tendons and as I got in there, I felt that this man will be better off to go to the emergency room and be evaluated for possible below-knee amputation.  He had a blood pressure 120/80, pulse 60, respirations 16, temperature 97.  He is a somewhat obese, weighs 260 pounds.  He has type 2 diabetes.  He has got hypertension.  He has a history of chronic renal disease, atrial fibrillation.  He also has a history of prostate cancer, and he has a suprapubic catheter.  I sent him to the emergency room with a note saying that I wanted a surgical consult for Mr. Salehi and hopefully he can get by with another below- knee amputation which I think would be fine for him as he is nonambulatory and spends all of his time in bed.     Judene Companion, M.D.     PP/MEDQ  D:  12/20/2013  T:  12/21/2013  Job:  [id number]

## 2013-12-25 NOTE — Progress Notes (Signed)
Occupational Therapy Evaluation Patient Details Name: Jesse Chandler MRN: [id number] DOB: Apr 08, 1924 Today's Date: 12/25/2013    History of Present Illness Pt presents with nonhealing wound RLE and underwent right BKA. 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   Clinical Impression   PTA pt lived at [facility] SNF and required Max A for ADLs, however pt reports that he could do more if ADL items were left closer to him. Pt performed oral care using swab. When rolling to sit EOB, noticed that pt had bowel movement that had affected bandaging on sacral area and pt performed bed mobility (rolling) with assistance to clean area and RN applied new dressing. Pt c/o swelling in B hands limiting ROM for composite digit flexion/extension. Retrograde massage was performed on B hands and education provided for edema management to pt and daughter. Pt would benefit from continued acute OT services to increase independence prior to d/c and to provide pt and caregiver (daughter) education.     Follow Up Recommendations  SNF;Supervision/Assistance - 24 hour    Equipment Recommendations  None recommended by OT       Precautions / Restrictions Precautions Precautions: Fall Required Braces or Orthoses: Knee Immobilizer - Right Knee Immobilizer - Right: On at all times Restrictions Weight Bearing Restrictions: Yes RLE Weight Bearing: Non weight bearing      Mobility Bed Mobility Overal bed mobility: +2 for physical assistance;Needs Assistance Bed Mobility: Rolling Rolling: +2 for physical assistance;Max assist (Pt able to reach for and hold hand rail with assistance)   Supine to sit: +2 for physical assistance;Max assist Sit to supine: Total assist;+2 for physical assistance   General bed mobility comments: Pt fearful of falling throughout transition to EOB. Hand-over-hand for bed rail use, however pt used UE's minimally to assist. Total A +2 to return to supine position.    Transfers                 General transfer comment: will require lift for transfers         ADL Eating/Feeding: Set up;Bed level Grooming: Oral care;Set up;Supervision/safety;Bed level                 General ADL Comments: Pt PLOF at SNF was max assist for ADLs.                Pertinent Vitals/Pain Pt c/o pain in RLE during bed mobility. RN was in room and made aware.      Hand Dominance  Right      Communication  No difficulties   Cognition Arousal/Alertness: Lethargic Behavior During Therapy: Flat affect Overall Cognitive Status: Impaired/Different from baseline (unsure of baseline but meds seem to be affecting now) Area of Impairment: Following commands;Problem solving;Memory     Memory: Decreased short-term memory Following Commands: Follows one step commands inconsistently;Follows one step commands with increased time     Problem Solving: Slow processing;Decreased initiation;Requires verbal cues;Requires tactile cues        Exercises Exercises: Hand exercises: AAROM with therapist assist for composite digit flexion/extension, wrist flexion/extension, and elbow flexion/extension X10 reps.        Prior Functioning/Environment  Pt required max assist for ADLs at SNF. Pt reports that he could do more if ADL items were left within his reach.             OT Diagnosis:  Acute pain, generalized weakness.   OT Problem List: Decreased strength;Decreased range of  motion;Decreased activity tolerance;Impaired balance (sitting and/or standing);Decreased cognition;Decreased safety awareness;Decreased knowledge of use of DME or AE;Decreased knowledge of precautions;Pain;Increased edema   OT Treatment/Interventions: Self-care/ADL training;Therapeutic exercise;Therapeutic activities;Patient/family education;Balance training    OT Goals(Current goals can be found in the care plan section) Acute Rehab OT Goals Patient Stated Goal: To decrease swelling  in B hands OT Goal Formulation: With patient/family Time For Goal Achievement: 01/01/14 Potential to Achieve Goals: Fair  OT Frequency: Min 2X/week           End of Session: Equipment Utilized During Treatment: Oxygen  Activity Tolerance: Patient limited by lethargy Patient left: in bed;with call bell/phone within reach;with family/visitor present   Time: 1610-9604 OT Time Calculation (min): 63 min Charges:  OT General Charges $OT Visit: 1 Procedure OT Evaluation $Initial OT Evaluation Tier I: 1 Procedure OT Treatments $Self Care/Home Management : 38-52 mins $Therapeutic Exercise: 8-22 mins  Juluis Rainier [phone] 12/25/2013, 1:33 PM

## 2013-12-25 NOTE — Progress Notes (Signed)
Physical Therapy Treatment Patient Details Name: Jesse Chandler MRN: [id number] DOB: May 27, 1924 Today's Date: 12/25/2013    History of Present Illness Pt presents with nonhealing wound RLE and underwent right BKA. 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    PT Comments    Pt progressing towards physical therapy goals. Pt's daughter present in room throughout session and expressed concern that staff was not pushing pt enough to transfer out of the bed and perform mobility. Explained to family member that the nursing staff can use the lift to transfer the pt bed<>chair throughout the day if pt wishes. Spoke with nursing after session regarding this. Pt showing improvement with ability to sit EOB, and was able to tolerate minimal trunk exercise. Continue to recommend SNF at d/c for progressive skilled PT.    Follow Up Recommendations  SNF;Supervision/Assistance - 24 hour     Equipment Recommendations  None recommended by PT    Recommendations for Other Services       Precautions / Restrictions Precautions Precautions: Fall Required Braces or Orthoses: Knee Immobilizer - Right Knee Immobilizer - Right: On at all times Restrictions Weight Bearing Restrictions: Yes RLE Weight Bearing: Non weight bearing    Mobility  Bed Mobility Overal bed mobility: +2 for physical assistance;Needs Assistance Bed Mobility: Supine to Sit;Sit to Supine     Supine to sit: +2 for physical assistance;Max assist Sit to supine: Total assist;+2 for physical assistance   General bed mobility comments: Pt fearful of falling throughout transition to EOB. Hand-over-hand for bed rail use, however pt used UE's minimally to assist. Total A +2 to return to supine position.   Transfers                 General transfer comment: Unable to transfer. Spoke with nursing regarding lift use to/from chair.  Ambulation/Gait                 Stairs            Wheelchair  Mobility    Modified Rankin (Stroke Patients Only)       Balance Overall balance assessment: Needs assistance Sitting-balance support: Bilateral upper extremity supported;Feet unsupported Sitting balance-Leahy Scale: Zero Sitting balance - Comments: Pt pushing trunk posteriorly due to fear of falling. Able to calm anxiety level down to a point where pt was not actively pushing against therapist. Max assist from behind required to maintain balance at EOB.  Postural control: Posterior lean                          Cognition Arousal/Alertness: Lethargic Behavior During Therapy: Flat affect;Anxious Overall Cognitive Status: Impaired/Different from baseline (unsure of baseline but meds seem to be affecting now) Area of Impairment: Following commands;Problem solving;Memory                    Exercises Other Exercises Other Exercises: Sitting EOB pt able to perform x10 left lateral leans, and x10 right lateral leans with assist to return to midline. BUE's used for support.  Other Exercises: x10 LAQ on L residual limb. No movement attempted with R residual limb due to pt pain level.     General Comments General comments (skin integrity, edema, etc.): RN notified of scab that appeared to have peeled off of L residual limb that family member was concerned about.       Pertinent Vitals/Pain Pt reports 8/10 pain  at rest. RN notified and pt received pain medication during session.     Home Living                      Prior Function            PT Goals (current goals can now be found in the care plan section) Acute Rehab PT Goals Patient Stated Goal: none stated PT Goal Formulation: With family Time For Goal Achievement: 01/06/14 Potential to Achieve Goals: Fair Progress towards PT goals: Progressing toward goals    Frequency  Min 3X/week    PT Plan Current plan remains appropriate    End of Session Equipment Utilized During Treatment: Right knee  immobilizer Activity Tolerance: Patient limited by lethargy;Patient limited by pain Patient left: in bed;with call bell/phone within reach;with family/visitor present     Time: 0912-0944 PT Time Calculation (min): 32 min  Charges:  $Therapeutic Exercise: 8-22 mins $Therapeutic Activity: 8-22 mins                    G Codes:      Jolyn Lent 2014/01/16, 10:27 AM  Jolyn Lent, PT, DPT Acute Rehabilitation Services Pager: [phone]

## 2013-12-25 NOTE — Progress Notes (Signed)
CSW [employer] forces technical officer) spoke with pt daughter and provided with current bed offers. Pt daughter unsure what they would like dc plan to be. Pt daughter informed CSW she has thought about taking pt home many times but it is just too much for her to handle at this time. CSW provided support and understanding. CSW to leave full list of bed offers received by end of day with pt nurse to provide to pt daughter Rosalyn Gess when she returns to the hospital.  After speaking with pt daughter, CSW became aware that [facility] no longer has pt bed available. CSW called facility and informed that pt will not be ready for dc until at least mid week (pt daughter informed CSW that MD had told her this). Facility said they would take pt back if they had a bed available on day of dc but they are not expecting any openings.  [place], [place]

## 2013-12-25 NOTE — Progress Notes (Signed)
Chart reviewed.  Discussed with Dr. Linus Salmons.                                                                                                                   PATIENT IS  JEHOVA[facility]'S WITNESS- DOES NOT TAKE BLOOD PRODUCTS                                            Patient Demographics  Jesse Chandler, is a 80 y.o. male, DOB - August 05, 1924, EUM:[id number]  Admit date - 12/20/2013   Admitting Physician Thurnell Lose, MD  Outpatient Primary MD for the patient is [place], MD  LOS - 5   Chief Complaint  Patient presents with  . wound, pressure ulcers         Assessment & Plan    Right leg diabetic ulcer suspicious for osteomyelitis - S/p BKA. Discussed with Dr. Linus Salmons. As pt is now post BKA and no evidence of ongoing infection, will d/c abx.  Stage 3 sacral decubitus. Local woundcare  No diarrhea today.  Hyperkalemia Recheck in am   Diabetes mellitus type 2. Well controlled  History of paroxysmal atrial fibrillation.   Hypothyroidism continue home Synthroid.    CKD stage III. Currently appears to be at baseline from 1.3.   History of prostate cancer: Status post suprapubic catheter  Left bundle branch block - unknown chronicity, previous EKG shows partial block, no chest pain, serial troponin negative -echo shows diastolic dysfunction   Anemia - does not take blood products On aranesp and IV iron  MOST FORM ON CHART Prognosis guarded  Code Status: DNR  Family Communication: none available  Disposition Plan: SNF when bed arranged   Procedures  Echo   Consults ID Dr. Johnnye Sima, orthopedics Dr. Doran Durand   Medications  Scheduled Meds: . B-complex with vitamin C  1 tablet Oral Daily  . calcium-vitamin D  1 tablet Oral BID  . diltiazem  240 mg Oral Daily  . feeding supplement (GLUCERNA SHAKE)  237 mL Oral TID BM  . feeding supplement (PRO-STAT SUGAR FREE 64)  30 mL Oral Daily  . folic acid  1 mg Oral Daily  . furosemide  40 mg Oral Daily  . guaiFENesin  1,200  mg Oral BID  . influenza vac split quadrivalent PF  0.5 mL Intramuscular Tomorrow-1000  . insulin aspart  0-15 Units Subcutaneous TID WC  . insulin aspart  0-5 Units Subcutaneous QHS  . insulin glargine  5 Units Subcutaneous Daily  . levothyroxine  50 mcg Oral QAC breakfast  . multivitamin with minerals  1 tablet Oral Daily  . nutrition supplement (JUVEN)  1 packet Oral BID BM  . pantoprazole  40 mg Oral Daily  . pneumococcal 23 valent vaccine  0.5 mL Intramuscular Tomorrow-1000  . polyethylene glycol  17 g Oral Daily  . saccharomyces boulardii  250 mg Oral BID  . vitamin C  500 mg Oral Daily   Continuous Infusions: . lactated ringers 20 mL/hr at 12/22/13 1613   PRN Meds:.guaiFENesin-dextromethorphan, HYDROcodone-acetaminophen, ipratropium-albuterol, loperamide, magnesium hydroxide, morphine injection, ondansetron (ZOFRAN) IV, ondansetron, sodium chloride  DVT Prophylaxis  Heparin   Lab Results  Component Value Date   PLT 213 12/24/2013    Antibiotics    Anti-infectives   Start     Dose/Rate Route Frequency Ordered Stop   12/21/13 1600  vancomycin (VANCOCIN) 1,500 mg in sodium chloride 0.9 % 500 mL IVPB  Status:  Discontinued     1,500 mg 250 mL/hr over 120 Minutes Intravenous Every 24 hours 12/20/13 1747 12/25/13 1626   12/21/13 0200  meropenem (MERREM) 1 g in sodium chloride 0.9 % 100 mL IVPB  Status:  Discontinued     1 g 200 mL/hr over 30 Minutes Intravenous Every 8 hours 12/20/13 1747 12/25/13 1626   12/20/13 1715  meropenem (MERREM) 1 g in sodium chloride 0.9 % 100 mL IVPB     1 g 200 mL/hr over 30 Minutes Intravenous NOW 12/20/13 1711 12/20/13 1840   12/20/13 1445  vancomycin (VANCOCIN) 2,000 mg in sodium chloride 0.9 % 500 mL IVPB     2,000 mg 250 mL/hr over 120 Minutes Intravenous  Once 12/20/13 1427 12/20/13 1805          Subjective:   No complaints. Per RN, more alert today. Ate a bit  Objective:   Filed Vitals:   12/25/13 0245 12/25/13 0543 12/25/13  1116 12/25/13 1555  BP:  105/44 105/50 117/42  Pulse:  89  83  Temp: 99.3 F (37.4 C) 98.9 F (37.2 C)  97.8 F (36.6 C)  TempSrc: Oral Oral  Oral  Resp:  18  18  Height:      Weight:      SpO2:  99%  100%    Wt Readings from Last 3 Encounters:  12/21/13 102.4 kg (225 lb 12 oz)  12/21/13 102.4 kg (225 lb 12 oz)  05/31/13 97.523 kg (215 lb)     Intake/Output Summary (Last 24 hours) at 12/25/13 1627 Last data filed at 12/25/13 0548  Gross per 24 hour  Intake    440 ml  Output    500 ml  Net    -60 ml     Physical Exam  Awake Alert, Oriented X 3 Symmetrical Chest wall movement, Good air movement bilaterally, CTAB RRR,No Gallops,Rubs or new Murmurs +ve B.Sounds, Abd Soft, Non tender, No organomegaly appriciated, No rebound - guarding or rigidity. Suprapubic catheter in place left BKA, right leg ulcer under bandage.   Data Review   Micro Results Recent Results (from the past 240 hour(s))  CULTURE, BLOOD (ROUTINE X 2)     Status: None   Collection Time    12/20/13 12:48 PM      Result Value Ref Range Status   Specimen Description BLOOD RIGHT ANTECUBITAL   Final   Special Requests BOTTLES DRAWN AEROBIC AND ANAEROBIC 5ML   Final   Culture  Setup Time     Final   Value: 12/20/2013 16:03     Performed at [facility]   Culture     Final   Value:        BLOOD CULTURE RECEIVED NO GROWTH TO DATE CULTURE WILL BE HELD FOR 5 DAYS BEFORE ISSUING A FINAL NEGATIVE REPORT     Performed at [facility]   Report Status PENDING   Incomplete  CULTURE, BLOOD (ROUTINE X 2)  Status: None   Collection Time    12/20/13 12:48 PM      Result Value Ref Range Status   Specimen Description BLOOD LEFT ANTECUBITAL   Final   Special Requests BOTTLES DRAWN AEROBIC AND ANAEROBIC 5ML   Final   Culture  Setup Time     Final   Value: 12/20/2013 16:02     Performed at [facility]   Culture     Final   Value:        BLOOD CULTURE RECEIVED NO GROWTH TO DATE CULTURE  WILL BE HELD FOR 5 DAYS BEFORE ISSUING A FINAL NEGATIVE REPORT     Performed at [facility]   Report Status PENDING   Incomplete  WOUND CULTURE     Status: None   Collection Time    12/20/13  2:42 PM      Result Value Ref Range Status   Specimen Description WOUND   Final   Special Requests Normal   Final   Gram Stain     Final   Value: NO WBC SEEN     NO SQUAMOUS EPITHELIAL CELLS SEEN     RARE GRAM POSITIVE COCCI IN PAIRS     RARE GRAM NEGATIVE RODS     Performed at [facility]   Culture     Final   Value: MULTIPLE ORGANISMS PRESENT, NONE PREDOMINANT     Note: NO STAPHYLOCOCCUS AUREUS ISOLATED NO GROUP A STREP (S.PYOGENES) ISOLATED     Performed at [facility]   Report Status 12/23/2013 FINAL   Final  URINE CULTURE     Status: None   Collection Time    12/20/13  3:20 PM      Result Value Ref Range Status   Specimen Description URINE, SUPRAPUBIC   Final   Special Requests NONE   Final   Culture  Setup Time     Final   Value: 12/21/2013 06:06     Performed at [facility] Count     Final   Value: NO GROWTH     Performed at [facility]   Culture     Final   Value: NO GROWTH     Performed at [facility]   Report Status 12/22/2013 FINAL   Final  CLOSTRIDIUM DIFFICILE BY PCR     Status: None   Collection Time    12/20/13  3:20 PM      Result Value Ref Range Status   C difficile by pcr NEGATIVE  NEGATIVE Final   Comment: Performed at [facility]  SURGICAL PCR SCREEN     Status: None   Collection Time    12/22/13  2:23 AM      Result Value Ref Range Status   MRSA, PCR NEGATIVE  NEGATIVE Final   Staphylococcus aureus NEGATIVE  NEGATIVE Final   Comment:            The Xpert SA Assay (FDA     approved for NASAL specimens     in patients over 10 years of age),     is one component of     a comprehensive surveillance     program.  Test performance has     been validated by [employer] for patients  greater     than or equal to 41 year old.     It is not intended     to diagnose infection  nor to     guide or monitor treatment.    Radiology Reports Dg Chest Port 1 View  12/20/2013   CLINICAL DATA:  Fever, atrial fibrillation  EXAM: PORTABLE CHEST - 1 VIEW  COMPARISON:  12/14/2013  FINDINGS: Background hyperinflation compatible with COPD/ emphysema. Similar small pleural effusions persist with compressive basilar atelectasis. No significant interval change. Upper lobes remain clear. No pneumothorax. Trachea is midline.  IMPRESSION: COPD/ emphysema  Similar small effusions with basilar compressive atelectasis   Electronically Signed   By: Daryll Brod M.D.   On: 12/20/2013 15:56    CBC  Recent Labs Lab 12/20/13 1248 12/21/13 0440 12/23/13 0345 12/23/13 2000 12/24/13 0619  WBC 17.1* 12.2* 10.4 14.1* 13.9*  HGB 8.7* 7.6* 6.2* 6.3* 6.1*  HCT 27.9* 25.5* 19.5* 19.7* 19.0*  PLT 287 262 206 213 213  MCV 91.5 92.4 92.9 92.9 92.7  MCH 28.5 27.5 29.5 29.7 29.8  MCHC 31.2 29.8* 31.8 32.0 32.1  RDW 15.5 15.8* 16.4* 16.7* 16.6*    Chemistries   Recent Labs Lab 12/20/13 1248 12/21/13 0440 12/23/13 0345 12/24/13 0619  NA 138 137 136* 136*  K 4.9 4.7 4.1 5.4*  CL 105 106 103 106  CO2 21 19 22 22   GLUCOSE 222* 123* 133* 88  BUN 34* 30* 19 25*  CREATININE 1.21 1.13 0.90 0.92  CALCIUM 9.7 9.2 8.3* 8.7  AST 10  --   --   --   ALT <5  --   --   --   ALKPHOS 115  --   --   --   BILITOT <0.2*  --   --   --    ------------------------------------------------------------------------------------------------------------------ estimated creatinine clearance is 69.8 ml/min (by C-G formula based on Cr of 0.92). ------------------------------------------------------------------------------------------------------------------ No results found for this basename: HGBA1C,  in the last 72  hours ------------------------------------------------------------------------------------------------------------------ No results found for this basename: CHOL, HDL, LDLCALC, TRIG, CHOLHDL, LDLDIRECT,  in the last 72 hours ------------------------------------------------------------------------------------------------------------------ No results found for this basename: TSH, T4TOTAL, FREET3, T3FREE, THYROIDAB,  in the last 72 hours ------------------------------------------------------------------------------------------------------------------ No results found for this basename: VITAMINB12, FOLATE, FERRITIN, TIBC, IRON, RETICCTPCT,  in the last 72 hours  Coagulation profile No results found for this basename: INR, PROTIME,  in the last 168 hours  No results found for this basename: DDIMER,  in the last 72 hours  Cardiac Enzymes  Recent Labs Lab 12/21/13 1130 12/21/13 2250 12/22/13 0552  TROPONINI <0.30 <0.30 <0.30   ------------------------------------------------------------------------------------------------------------------ No components found with this basename: POCBNP,      Time Spent in minutes  35   Chares Slaymaker L  MD on 12/25/2013 at 4:27 PM [facility] [phone]

## 2013-12-25 NOTE — Progress Notes (Signed)
Subjective: 3 Days Post-Op Procedure(s) (LRB): AMPUTATION BELOW KNEE (Right) Patient doing well this AM, resting comfortably in bed.  Pt denies N/V/F/C, chest pain, SOB, calf pain or paresthesia b/l.   Objective: Vital signs in last 24 hours: Temp:  [98.8 F (37.1 C)-100.6 F (38.1 C)] 98.9 F (37.2 C) (03/30 0543) Pulse Rate:  [80-89] 89 (03/30 0543) Resp:  [18] 18 (03/30 0543) BP: (105-131)/(32-47) 105/44 mmHg (03/30 0543) SpO2:  [99 %-100 %] 99 % (03/30 0543)  Intake/Output from previous day: 03/29 0701 - 03/30 0700 In: 920 [P.O.:680; I.V.:240] Out: 1000 [Urine:1000] Intake/Output this shift:     Recent Labs  12/23/13 0345 12/23/13 2000 12/24/13 0619  HGB 6.2* 6.3* 6.1*    Recent Labs  12/23/13 2000 12/24/13 0619  WBC 14.1* 13.9*  RBC 2.12* 2.05*  HCT 19.7* 19.0*  PLT 213 213    Recent Labs  12/23/13 0345 12/24/13 0619  NA 136* 136*  K 4.1 5.4*  CL 103 106  CO2 22 22  BUN 19 25*  CREATININE 0.90 0.92  GLUCOSE 133* 88  CALCIUM 8.3* 8.7   No results found for this basename: LABPT, INR,  in the last 72 hours  WD WN 80y/o male with dementia in NAD, A/Ox3, appears stated age. EOMI, mood and affect normal, respirations unlabored.  Stump protector and shrinker in place and in good repair.  Dressing covering incision has moderate bloody drainage.  Incision is intact with sutures well placed with no dehiscence noted.  +TTP to right stump.  Sensation intact distally. No infection noted.  Assessment/Plan: 3 Days Post-Op Procedure(s) (LRB): AMPUTATION BELOW KNEE (Right) Continue medical management as dictated by Dr. Conley Canal Up with therapy Will continue to follow  [place], [place] 12/25/2013, 8:18 AM

## 2013-12-25 NOTE — Progress Notes (Signed)
02 sat 99% on room air

## 2013-12-26 ENCOUNTER — Encounter (HOSPITAL_COMMUNITY): Payer: Self-pay | Admitting: Orthopedic Surgery

## 2013-12-26 LAB — CULTURE, BLOOD (ROUTINE X 2)
CULTURE: NO GROWTH
Culture: NO GROWTH

## 2013-12-26 LAB — GLUCOSE, CAPILLARY
GLUCOSE-CAPILLARY: 108 mg/dL — AB (ref 70–99)
Glucose-Capillary: 89 mg/dL (ref 70–99)
Glucose-Capillary: 117 mg/dL — ABNORMAL HIGH (ref 70–99)

## 2013-12-26 LAB — BASIC METABOLIC PANEL
BUN: 37 mg/dL — ABNORMAL HIGH (ref 6–23)
CHLORIDE: 105 meq/L (ref 96–112)
CO2: 22 mEq/L (ref 19–32)
Calcium: 8.3 mg/dL — ABNORMAL LOW (ref 8.4–10.5)
Creatinine, Ser: 0.88 mg/dL (ref 0.50–1.35)
GFR calc Af Amer: 86 mL/min — ABNORMAL LOW (ref >90)
GFR calc non Af Amer: 74 mL/min — ABNORMAL LOW (ref >90)
GLUCOSE: 88 mg/dL (ref 70–99)
POTASSIUM: 4.6 meq/L (ref 3.7–5.3)
Sodium: 138 mEq/L (ref 137–147)

## 2013-12-26 MED ORDER — POTASSIUM CHLORIDE CRYS ER 20 MEQ PO TBCR: 20.0000 meq | EXTENDED_RELEASE_TABLET | Freq: Every day | ORAL | Status: AC

## 2013-12-26 MED ORDER — ONDANSETRON HCL 4 MG PO TABS: 4.0000 mg | ORAL_TABLET | Freq: Four times a day (QID) | ORAL | Status: AC | PRN

## 2013-12-26 MED ORDER — HYDROCODONE-ACETAMINOPHEN 5-325 MG PO TABS: 1.0000 | ORAL_TABLET | Freq: Four times a day (QID) | ORAL | Status: DC | PRN

## 2013-12-26 MED ORDER — FUROSEMIDE 40 MG PO TABS: 40.0000 mg | ORAL_TABLET | Freq: Every day | ORAL | Status: AC

## 2013-12-26 MED ORDER — SENNOSIDES 8.6 MG PO TABS: 1.0000 | ORAL_TABLET | Freq: Every day | ORAL | Status: AC | PRN

## 2013-12-26 MED ORDER — GLUCERNA SHAKE PO LIQD: 237.0000 mL | Freq: Three times a day (TID) | ORAL | Status: AC

## 2013-12-26 MED ORDER — POLYETHYLENE GLYCOL 3350 17 G PO PACK: 17.0000 g | PACK | Freq: Every day | ORAL | Status: AC | PRN

## 2013-12-26 MED ORDER — INSULIN GLARGINE 100 UNIT/ML ~~LOC~~ SOLN: 5.0000 [IU] | Freq: Every day | SUBCUTANEOUS | Status: AC

## 2013-12-26 NOTE — Anesthesia Postprocedure Evaluation (Signed)
  Anesthesia Post-op Note  Patient: Jesse Chandler  Procedure(s) Performed: Procedure(s) with comments: AMPUTATION BELOW KNEE (Right) - right below knee amputation  Patient Location: Nursing Unit  Anesthesia Type:General  Level of Consciousness: awake, alert  and oriented  Airway and Oxygen Therapy: Patient Spontanous Breathing  Post-op Pain: none  Post-op Assessment: Patient's Cardiovascular Status Stable  Post-op Vital Signs: stable  Complications: No apparent anesthesia complications

## 2013-12-26 NOTE — Progress Notes (Signed)
Physical Therapy Treatment Patient Details Name: Jesse Chandler MRN: [id number] DOB: Jun 05, 1924 Today's Date: 12/26/2013    History of Present Illness post op Rt BKA with slow recovery    PT Comments    Daughter called me back into the room to ask question about need for wheelchair now that pt. Is bilateral BKA.  I directed her to the rehab department at the SNF he will be going for rehab. She talked a lot about patient's past experiences at SNF and her ultimate desire to take patient back home as she has an accessible home with the equipment that he needs.  She works for the school system and will not be [employer] til June unless she quits her job.  She is a strong advocate for her dad and wants him to get aggressive rehab.  Follow Up Recommendations  SNF;Supervision/Assistance - 24 hour     Equipment Recommendations       Recommendations for Other Services       Precautions / Restrictions Precautions Precautions: Fall Required Braces or Orthoses: Knee Immobilizer - Right Knee Immobilizer - Right: On at all times Restrictions Weight Bearing Restrictions: Yes RLE Weight Bearing: Non weight bearing    Mobility  Bed Mobility Overal bed mobility: +2 for physical assistance Bed Mobility: Rolling Rolling: +2 for physical assistance;Max assist         General bed mobility comments: Pt using  arms to initiate pulling on bedrails, but neede max assist to move hips to tall sidelying  Pt was positioned in sidelying with pillows for support of RLE and back on air mattress  Transfers Overall transfer level: Needs assistance               General transfer comment: pt will need maximove for transfers, Nursing in room and acknowledged    Ambulation/Gait             General Gait Details: NA   Stairs            Wheelchair Mobility    Modified Rankin (Stroke Patients Only)       Balance                                    Cognition  Arousal/Alertness: Lethargic Behavior During Therapy: Flat affect Overall Cognitive Status: Within Functional Limits for tasks assessed (pt responds better with daughter in room)                 General Comments: pt is able to follow one step commands with delay.  He responded much better once daughter came into room    Exercises General Exercises - Upper Extremity Shoulder Flexion: AAROM;Both;10 reps;Supine (pt with edema in UEs. Needs much assist to lift arms) Elbow Flexion: AAROM;Both;10 reps;Supine Elbow Extension: AAROM;Both;10 reps;Supine Wrist Flexion: AAROM;Both;10 reps;Supine Digit Composite Flexion: AROM;Both;5 reps;Supine Composite Extension: AROM;Both;5 reps;Supine General Exercises - Lower Extremity Short Arc Quad: AROM;Left;10 reps;Supine Hip ABduction/ADduction: AAROM;Right;15 reps;Sidelying Hip Flexion/Marching: AAROM;Right;10 reps;Supine (pt deferred hip flexion on right leg due to pain) Other Exercises Other Exercises: from sidelying, assist to roll hips forward and back  Other Exercises: RUE AAROM in sidelying with encouragemet to engage core to keep from falling backward     General Comments  Pt was able to activate Rt gluteus medius in sidelying with assist     Pertinent Vitals/Pain Pt still c/o pain in RLE with movement  Home Living                      Prior Function            PT Goals (current goals can now be found in the care plan section) Progress towards PT goals: Progressing toward goals    Frequency       PT Plan Current plan remains appropriate    End of Session Equipment Utilized During Treatment: Right knee immobilizer Activity Tolerance: Patient limited by fatigue;Patient limited by pain Patient left: in bed;with call bell/phone within reach;with family/visitor present     Time: 1033-1100 PT Time Calculation (min): 27 min  Charges:  $Therapeutic Exercise: 8-22 mins $Therapeutic Activity: 8-22 mins                     G Codes:     Teresa K. [place], [place] 12/26/2013, 11:09 AM

## 2013-12-26 NOTE — Progress Notes (Signed)
Physical Therapy Treatment Patient Details Name: QUANTAVIUS HUMM MRN: [id number] DOB: March 15, 1924 Today's Date: 12/26/2013    History of Present Illness post op Rt BKA with slow recovery    PT Comments    Pt with edema in arms and LLE, RLE in custom Knee immobilizer  Follow Up Recommendations  SNF;Supervision/Assistance - 24 hour     Equipment Recommendations       Recommendations for Other Services       Precautions / Restrictions Precautions Precautions: Fall Required Braces or Orthoses: Knee Immobilizer - Right Knee Immobilizer - Right: On at all times Restrictions Weight Bearing Restrictions: Yes RLE Weight Bearing: Non weight bearing    Mobility  Bed Mobility Overal bed mobility: +2 for physical assistance;Needs Assistance             General bed mobility comments: pt was unable to assist much with bed mobility for rolling attempt.  He did have hands on rails, but was unable to activate core to initated rolling with hips  Transfers Overall transfer level: Needs assistance               General transfer comment: pt will need maximove for transfers, Nursing in room and acknowledged    Ambulation/Gait             General Gait Details: NA   Stairs            Wheelchair Mobility    Modified Rankin (Stroke Patients Only)       Balance                                    Cognition Arousal/Alertness: Lethargic Behavior During Therapy: Flat affect Overall Cognitive Status: Impaired/Different from baseline (daughter reports pt has been groggy since surgery)                 General Comments: pt is able to follow one step commands with delay.  He responded much better once daughter came into room    Exercises General Exercises - Upper Extremity Shoulder Flexion: AAROM;Both;10 reps;Supine (pt with edema in UEs. Needs much assist to lift arms) Elbow Flexion: AAROM;Both;10 reps;Supine Elbow Extension: AAROM;Both;10  reps;Supine Wrist Flexion: AAROM;Both;10 reps;Supine Digit Composite Flexion: AROM;Both;5 reps;Supine Composite Extension: AROM;Both;5 reps;Supine General Exercises - Lower Extremity Short Arc Quad: AROM;Left;10 reps;Supine Hip Flexion/Marching: AAROM;Right;10 reps;Supine (pt deferred hip flexion on right leg due to pain) Other Exercises Other Exercises: pt needed much encouragement for all exercise    General Comments        Pertinent Vitals/Pain Pt c/o pain in RLE, nurse provided pain med    Home Living                      Prior Function            PT Goals (current goals can now be found in the care plan section) Progress towards PT goals: Progressing toward goals (slowly)    Frequency       PT Plan      End of Session Equipment Utilized During Treatment: Right knee immobilizer Activity Tolerance: Patient limited by fatigue;Patient limited by pain Patient left: in bed;with call bell/phone within reach;with family/visitor present     Time: 9628-3662 PT Time Calculation (min): 32 min  Charges:  $Therapeutic Exercise: 23-37 mins  G Codes:     Donato Heinz. Roberts, [place] 12/26/2013, 11:01 AM

## 2013-12-26 NOTE — Progress Notes (Signed)
Subjective: 4 Days Post-Op Procedure(s) (LRB): AMPUTATION BELOW KNEE (Right) Patient resting in bed this AM.  Pain well tolerated and mild.  Progressing with physical therapy. Pt denies N/V/F/C, chest pain, SOB, or change in appetite.    Objective: Vital signs in last 24 hours: Temp:  [97.4 F (36.3 C)-98.4 F (36.9 C)] 97.4 F (36.3 C) (03/31 0700) Pulse Rate:  [74-83] 74 (03/31 0700) Resp:  [18] 18 (03/31 0700) BP: (105-128)/(38-50) 118/38 mmHg (03/31 0700) SpO2:  [93 %-100 %] 93 % (03/31 0700)  Intake/Output from previous day: 03/30 0701 - 03/31 0700 In: 1068 [P.O.:720; I.V.:248; IV Piggyback:100] Out: 1750 [Urine:1750] Intake/Output this shift:     Recent Labs  12/23/13 2000 12/24/13 0619  HGB 6.3* 6.1*    Recent Labs  12/23/13 2000 12/24/13 0619  WBC 14.1* 13.9*  RBC 2.12* 2.05*  HCT 19.7* 19.0*  PLT 213 213    Recent Labs  12/24/13 0619 12/26/13 0545  NA 136* 138  K 5.4* 4.6  CL 106 105  CO2 22 22  BUN 25* 37*  CREATININE 0.92 0.88  GLUCOSE 88 88  CALCIUM 8.7 8.3*   No results found for this basename: LABPT, INR,  in the last 72 hours  WD WN elderly male in NAD, with dementia, appears stated age.  EOMI, mood and affect normal, respirations unlabored. Stump protector and sock in good placement and in good repair.  +TTP to distal right stump.   Assessment/Plan: 4 Days Post-Op Procedure(s) (LRB): AMPUTATION BELOW KNEE (Right) Up with therapy Continue medical management Continue physical therapy  FLOWERS, CHRISTOPHER S 12/26/2013, 7:56 AM

## 2013-12-26 NOTE — Progress Notes (Addendum)
CSW Armed forces technical officer) spoke with pt daughter at pt bedside. Pt daughter has chosen [facility] and will be going to facility today after she is off work. Pt daughter aware that plan is for pt dc tomorrow. CSW left voicemail with facility to notify.  [place], [place]

## 2013-12-26 NOTE — Consult Note (Signed)
WOC wound re-consult requested: Ortho service following for assessment and plan of care to right leg. Daughter has requested a re-consult for sacrum and inner leg wounds. Refer to previous consult note from 3/27 for wound measurements and assessment.  Appearance unchanged since previous progress notes.  Daughter at bedside during consult to assess wounds and requests use of Aquacel.  Pt has used this topical treatment in the past, she states, and it was effective to promote wound healing.  She is very well-informed regarding wound care and systemic factors which may impair healing.  Optimal plan of care would be to continue air mattress use at SNF; please order if desired.   Dressing procedure/placement/frequency: Aquacel to absorb drainage and provide antimicrobial benefits to sacrum and inner groin wounds.  Foam dressing to protect. Please re-consult if further assistance is needed.  Thank-you,  Julien Girt MSN, [place], [place], [place], [place]

## 2013-12-26 NOTE — Progress Notes (Signed)
Chart reviewed.  Discussed with Dr. Linus Salmons.                                                                                                                   PATIENT IS  Jesse Chandler- DOES NOT TAKE BLOOD PRODUCTS                                            Patient Demographics  Jesse Chandler, is a 80 y.o. male, DOB - 07-17-1924, [id number]  Admit date - 12/20/2013   Admitting Physician Thurnell Lose, MD  Outpatient Primary MD for the patient is [place], MD  LOS - 6   Chief Complaint  Patient presents with  . wound, pressure ulcers         Assessment & Plan    Right leg diabetic ulcer suspicious for osteomyelitis - S/p BKA. abx stopped. Awaiting SNF. Stable for transfer when bed arranged  Stage 3 sacral decubitus. Local woundcare. Daughter has questions about aquacel. Has had good results previously. Have discussed with WOC who will make recs  No diarrhea today.  Hyperkalemia Recheck in am   Diabetes mellitus type 2. Well controlled  History of paroxysmal atrial fibrillation.   Hypothyroidism continue home Synthroid.    CKD stage III. Currently appears to be at baseline from 1.3.   History of prostate cancer: Status post suprapubic catheter  Left bundle branch block - unknown chronicity, previous EKG shows partial block, no chest pain, serial troponin negative -echo shows diastolic dysfunction   Anemia - does not take blood products On aranesp and IV iron  MOST FORM ON CHART Prognosis guarded  Code Status: DNR  Family Communication: none available  Disposition Plan: SNF when bed arranged   Procedures  Echo   Consults ID Dr. Johnnye Sima, orthopedics Dr. Doran Durand   Medications  Scheduled Meds: . B-complex with vitamin C  1 tablet Oral Daily  . calcium-vitamin D  1 tablet Oral BID  . diltiazem  240 mg Oral Daily  . feeding supplement (GLUCERNA SHAKE)  237 mL Oral TID BM  . feeding supplement (PRO-STAT SUGAR FREE 64)  30 mL Oral Daily  .  folic acid  1 mg Oral Daily  . furosemide  40 mg Oral Daily  . influenza vac split quadrivalent PF  0.5 mL Intramuscular Tomorrow-1000  . insulin aspart  0-15 Units Subcutaneous TID WC  . insulin aspart  0-5 Units Subcutaneous QHS  . insulin glargine  5 Units Subcutaneous Daily  . levothyroxine  50 mcg Oral QAC breakfast  . multivitamin with minerals  1 tablet Oral Daily  . nutrition supplement (JUVEN)  1 packet Oral BID BM  . pantoprazole  40 mg Oral Daily  . pneumococcal 23 valent vaccine  0.5 mL Intramuscular Tomorrow-1000  . polyethylene glycol  17 g Oral Daily  . saccharomyces boulardii  250 mg Oral BID  . vitamin  C  500 mg Oral Daily   Continuous Infusions: . lactated ringers 20 mL/hr at 12/22/13 1613   PRN Meds:.guaiFENesin, guaiFENesin-dextromethorphan, HYDROcodone-acetaminophen, ipratropium-albuterol, loperamide, magnesium hydroxide, morphine injection, ondansetron (ZOFRAN) IV, ondansetron, sodium chloride  DVT Prophylaxis  Heparin   Lab Results  Component Value Date   PLT 213 12/24/2013    Antibiotics    Anti-infectives   Start     Dose/Rate Route Frequency Ordered Stop   12/21/13 1600  vancomycin (VANCOCIN) 1,500 mg in sodium chloride 0.9 % 500 mL IVPB  Status:  Discontinued     1,500 mg 250 mL/hr over 120 Minutes Intravenous Every 24 hours 12/20/13 1747 12/25/13 1626   12/21/13 0200  meropenem (MERREM) 1 g in sodium chloride 0.9 % 100 mL IVPB  Status:  Discontinued     1 g 200 mL/hr over 30 Minutes Intravenous Every 8 hours 12/20/13 1747 12/25/13 1626   12/20/13 1715  meropenem (MERREM) 1 g in sodium chloride 0.9 % 100 mL IVPB     1 g 200 mL/hr over 30 Minutes Intravenous NOW 12/20/13 1711 12/20/13 1840   12/20/13 1445  vancomycin (VANCOCIN) 2,000 mg in sodium chloride 0.9 % 500 mL IVPB     2,000 mg 250 mL/hr over 120 Minutes Intravenous  Once 12/20/13 1427 12/20/13 1805          Subjective:   No complaints. Per RN, more alert today. Ate a  bit  Objective:   Filed Vitals:   12/25/13 1555 12/25/13 1746 12/25/13 2131 12/26/13 0700  BP: 117/42  128/44 118/38  Pulse: 83  81 74  Temp: 97.8 F (36.6 C)  98.4 F (36.9 C) 97.4 F (36.3 C)  TempSrc: Oral  Oral Oral  Resp: 18  18 18   Height:      Weight:      SpO2: 100% 98% 95% 93%    Wt Readings from Last 3 Encounters:  12/21/13 102.4 kg (225 lb 12 oz)  12/21/13 102.4 kg (225 lb 12 oz)  05/31/13 97.523 kg (215 lb)     Intake/Output Summary (Last 24 hours) at 12/26/13 1150 Last data filed at 12/26/13 0900  Gross per 24 hour  Intake    968 ml  Output   1750 ml  Net   -782 ml     Physical Exam  Awake Alert, Oriented X 3 Symmetrical Chest wall movement, Good air movement bilaterally, CTAB RRR,No Gallops,Rubs or new Murmurs +ve B.Sounds, Abd Soft, Non tender, No organomegaly appriciated, No rebound - guarding or rigidity. Suprapubic catheter in place left BKA, right leg ulcer under bandage.   Data Review   Micro Results Recent Results (from the past 240 hour(s))  CULTURE, BLOOD (ROUTINE X 2)     Status: None   Collection Time    12/20/13 12:48 PM      Result Value Ref Range Status   Specimen Description BLOOD RIGHT ANTECUBITAL   Final   Special Requests BOTTLES DRAWN AEROBIC AND ANAEROBIC 5ML   Final   Culture  Setup Time     Final   Value: 12/20/2013 16:03     Performed at [facility]   Culture     Final   Value: NO GROWTH 5 DAYS     Performed at [facility]   Report Status 12/26/2013 FINAL   Final  CULTURE, BLOOD (ROUTINE X 2)     Status: None   Collection Time    12/20/13 12:48 PM      Result  Value Ref Range Status   Specimen Description BLOOD LEFT ANTECUBITAL   Final   Special Requests BOTTLES DRAWN AEROBIC AND ANAEROBIC 5ML   Final   Culture  Setup Time     Final   Value: 12/20/2013 16:02     Performed at [facility]   Culture     Final   Value: NO GROWTH 5 DAYS     Performed at [facility]   Report  Status 12/26/2013 FINAL   Final  WOUND CULTURE     Status: None   Collection Time    12/20/13  2:42 PM      Result Value Ref Range Status   Specimen Description WOUND   Final   Special Requests Normal   Final   Gram Stain     Final   Value: NO WBC SEEN     NO SQUAMOUS EPITHELIAL CELLS SEEN     RARE GRAM POSITIVE COCCI IN PAIRS     RARE GRAM NEGATIVE RODS     Performed at [facility]   Culture     Final   Value: MULTIPLE ORGANISMS PRESENT, NONE PREDOMINANT     Note: NO STAPHYLOCOCCUS AUREUS ISOLATED NO GROUP A STREP (S.PYOGENES) ISOLATED     Performed at [facility]   Report Status 12/23/2013 FINAL   Final  URINE CULTURE     Status: None   Collection Time    12/20/13  3:20 PM      Result Value Ref Range Status   Specimen Description URINE, SUPRAPUBIC   Final   Special Requests NONE   Final   Culture  Setup Time     Final   Value: 12/21/2013 06:06     Performed at [place]     Final   Value: NO GROWTH     Performed at [facility]   Culture     Final   Value: NO GROWTH     Performed at [facility]   Report Status 12/22/2013 FINAL   Final  CLOSTRIDIUM DIFFICILE BY PCR     Status: None   Collection Time    12/20/13  3:20 PM      Result Value Ref Range Status   C difficile by pcr NEGATIVE  NEGATIVE Final   Comment: Performed at [facility]  SURGICAL PCR SCREEN     Status: None   Collection Time    12/22/13  2:23 AM      Result Value Ref Range Status   MRSA, PCR NEGATIVE  NEGATIVE Final   Staphylococcus aureus NEGATIVE  NEGATIVE Final   Comment:            The Xpert SA Assay (FDA     approved for NASAL specimens     in patients over 60 years of age),     is one component of     a comprehensive surveillance     program.  Test performance has     been validated by [employer] for patients greater     than or equal to 6 year old.     It is not intended     to diagnose infection nor to     guide  or monitor treatment.    Radiology Reports Dg Chest Port 1 View  12/20/2013   CLINICAL DATA:  Fever, atrial fibrillation  EXAM: PORTABLE CHEST - 1 VIEW  COMPARISON:  12/14/2013  FINDINGS: Background hyperinflation compatible with COPD/ emphysema. Similar small pleural effusions persist with compressive basilar atelectasis. No significant interval change. Upper lobes remain clear. No pneumothorax. Trachea is midline.  IMPRESSION: COPD/ emphysema  Similar small effusions with basilar compressive atelectasis   Electronically Signed   By: Daryll Brod M.D.   On: 12/20/2013 15:56    CBC  Recent Labs Lab 12/20/13 1248 12/21/13 0440 12/23/13 0345 12/23/13 2000 12/24/13 0619  WBC 17.1* 12.2* 10.4 14.1* 13.9*  HGB 8.7* 7.6* 6.2* 6.3* 6.1*  HCT 27.9* 25.5* 19.5* 19.7* 19.0*  PLT 287 262 206 213 213  MCV 91.5 92.4 92.9 92.9 92.7  MCH 28.5 27.5 29.5 29.7 29.8  MCHC 31.2 29.8* 31.8 32.0 32.1  RDW 15.5 15.8* 16.4* 16.7* 16.6*    Chemistries   Recent Labs Lab 12/20/13 1248 12/21/13 0440 12/23/13 0345 12/24/13 0619 12/26/13 0545  NA 138 137 136* 136* 138  K 4.9 4.7 4.1 5.4* 4.6  CL 105 106 103 106 105  CO2 21 19 22 22 22   GLUCOSE 222* 123* 133* 88 88  BUN 34* 30* 19 25* 37*  CREATININE 1.21 1.13 0.90 0.92 0.88  CALCIUM 9.7 9.2 8.3* 8.7 8.3*  AST 10  --   --   --   --   ALT <5  --   --   --   --   ALKPHOS 115  --   --   --   --   BILITOT <0.2*  --   --   --   --    ------------------------------------------------------------------------------------------------------------------ estimated creatinine clearance is 73 ml/min (by C-G formula based on Cr of 0.88). ------------------------------------------------------------------------------------------------------------------ No results found for this basename: HGBA1C,  in the last 72 hours ------------------------------------------------------------------------------------------------------------------ No results found for this  basename: CHOL, HDL, LDLCALC, TRIG, CHOLHDL, LDLDIRECT,  in the last 72 hours ------------------------------------------------------------------------------------------------------------------ No results found for this basename: TSH, T4TOTAL, FREET3, T3FREE, THYROIDAB,  in the last 72 hours ------------------------------------------------------------------------------------------------------------------ No results found for this basename: VITAMINB12, FOLATE, FERRITIN, TIBC, IRON, RETICCTPCT,  in the last 72 hours  Coagulation profile No results found for this basename: INR, PROTIME,  in the last 168 hours  No results found for this basename: DDIMER,  in the last 72 hours  Cardiac Enzymes  Recent Labs Lab 12/21/13 1130 12/21/13 2250 12/22/13 0552  TROPONINI <0.30 <0.30 <0.30   ------------------------------------------------------------------------------------------------------------------ No components found with this basename: POCBNP,      Time Spent in minutes  35   Atira Borello L  MD on 12/26/2013 at 11:50 AM [facility] [phone]

## 2013-12-27 DIAGNOSIS — I4891 Unspecified atrial fibrillation: Secondary | ICD-10-CM

## 2013-12-27 LAB — GLUCOSE, CAPILLARY
Glucose-Capillary: 144 mg/dL — ABNORMAL HIGH (ref 70–99)
Glucose-Capillary: 102 mg/dL — ABNORMAL HIGH (ref 70–99)
Glucose-Capillary: 132 mg/dL — ABNORMAL HIGH (ref 70–99)

## 2013-12-27 MED ORDER — HYDROCODONE-ACETAMINOPHEN 5-325 MG PO TABS: 1.0000 | ORAL_TABLET | Freq: Four times a day (QID) | ORAL | Status: AC | PRN

## 2013-12-27 NOTE — Progress Notes (Signed)
OT Cancellation and Discharge Note  Patient Details Name: Jesse Chandler MRN: [id number] DOB: [birth year]   Cancelled Treatment:    Reason Eval/Treat Not Completed: Other (comment). OT made visit this date to address ADLs. Pt's daughter at bedside, reports that pt will be d/c today to SNF and OT needs will be addressed there. No further acute OT needs at this time. Acute OT to sign off.   Juluis Rainier [phone] 12/27/2013, 1:17 PM

## 2013-12-27 NOTE — Progress Notes (Signed)
CSW (Clinical Social Worker) prepared pt dc packet and placed with shadow chart. CSW arranged non-emergent ambulance transport. Pt, pt family, pt nurse, and facility informed. CSW signing off.  Berkley Cronkright, LCSWA [phone]  

## 2013-12-27 NOTE — Progress Notes (Signed)
L PICC D/C per MD order. Vaseline pressure gauze applied/pressure x5 min/no bleeding. Lorri Frederick, RN IV Team

## 2013-12-27 NOTE — Progress Notes (Signed)
Subjective: Pt resting comfortably in bed this AM. No new complaints. Pt feels hungry this AM. Pt states he is not in any pain at this time.  Pt denies N/V/F/C, chest pain, SOB, or changes in appetite.  Objective: Vital signs in last 24 hours: Temp:  [97.7 F (36.5 C)-98.5 F (36.9 C)] 97.7 F (36.5 C) (04/01 0518) Pulse Rate:  [68-75] 68 (04/01 0518) Resp:  [16-18] 18 (04/01 0518) BP: (104-141)/(29-50) 111/36 mmHg (04/01 0518) SpO2:  [98 %-100 %] 98 % (04/01 0518)  Intake/Output from previous day: 03/31 0701 - 04/01 0700 In: 1213 [P.O.:717; I.V.:496] Out: 1150 [Urine:1150] Intake/Output this shift: Total I/O In: 255 [I.V.:255] Out: -   No results found for this basename: HGB,  in the last 72 hours No results found for this basename: WBC, RBC, HCT, PLT,  in the last 72 hours  Recent Labs  12/26/13 0545  NA 138  K 4.6  CL 105  CO2 22  BUN 37*  CREATININE 0.88  GLUCOSE 88  CALCIUM 8.3*   No results found for this basename: LABPT, INR,  in the last 72 hours  80y/o male in NAD, appears stated age.  EOMI, somnolent this AM, respirations unlabored. On physical exam stump protector in proper placement and in good repair.  +TTP to distal stump.  Assessment/Plan: Continue medical management Continue physical therapy   FLOWERS, CHRISTOPHER S 12/27/2013, 8:08 AM

## 2013-12-27 NOTE — Discharge Summary (Signed)
Physician Discharge Summary  Jesse Chandler GLO:[id number] DOB: 13-Feb-1924 DOA: 12/20/2013  PCP: Teressa Lower, MD  Admit date: 12/20/2013 Discharge date: 12/27/2013  Time spent: greater than 30 minutes  Recommendations for Outpatient Follow-up:  1. See discharge instructions below  Discharge Diagnoses:  Principal Problem:   Infected pressure ulcer, right lateral malleolus likely osteomyelitis, present on admission. Right BKA during this hospitalization Active Problems:   Paroxysmal a-fib   Hypothyroidism   Acute kidney injury    Previous L. BKA    Diabetic ulcer of ankle   Anemia Suprapubic catheter in place Jehovah's Witness, does not accept blood products Stage III sacral pressure sore, present on admission Debility CODE STATUS DO NOT RESUSCITATE  Discharge Condition: Stable  Filed Weights   12/20/13 1700 12/21/13 0507  Weight: 101.3 kg (223 lb 5.2 oz) 102.4 kg (225 lb 12 oz)    History of present illness:  80 y.o. male, diabetes mellitus, bilateral diabetic foot ulcer, left BKA one year ago, infected right leg also for last several months currently on 3 different antibiotics, chronic kidney disease stage IV baseline creatinine 1.3, hypertension, bedbound state with stage 3 sacrodecubitus ulcer, chronic urinary retention requiring suprapubic catheter which was changed a few days ago, paroxysmal atrial fibrillation currently in sinus not on anticoagulation, early dementia, anemia of chronic disease, hypothyroidism, history of prostate cancer who lives in a nursing home near [place] and goes to the wound care clinic at [facility] and being followed for a right leg ulcer at the wound care clinic for several weeks comes in from wound care clinic as due to suspicion of worsening right leg ulcer infection.  Apparently the right leg also has been draining some serosanguineous material the last few weeks, in the last week or so he has been started on 3 different oral antibiotics namely  Augmentin-doxycycline-Zyvox, in the past he has grown MRSA and modified drug-resistant Acinetobacter, he has seen Dr. Tommy Medal of infectious disease in the past. He was sent to the ER after in the wound clinic and infected tendon was excised for further care, in the ER he was found to have leukocytosis, right leg diabetic ulcer wound infection, stage II sacrodecubitus also, mild diarrhea.  Hospital Course:  Patient was admitted to medicine. Infectious disease and orthopedics consulted. Was felt that patient's limb was not salvageable and Dr. Doran Durand performed a right BKA. No evidence of ongoing infection and antibiotics have been stopped with infectious disease in agreement.   Stool was C. difficile negative.   Patient was anemic on admission and is Jehovah's Witness. Refused transfusion. The patient has been on Aranesp as an outpatient. Received IV iron while here.  Wound care nurse consult in for sacral decubitus. See recommendations below. Patient has been medically stable for discharge to skilled nursing facility for several days. Will go today.  Procedures:  Right leg BKA.  PICC line which has been removed  Consultations:  Dr. Doran Durand, orthopedics  Infectious disease  Discharge Exam: Filed Vitals:   12/27/13 1123  BP: 117/71  Pulse: 65  Temp: 97.9 F (36.6 C)  Resp: 16    General: Alert. Comfortable. Cardiovascular: Regular rate rhythm without murmurs, rubs Respiratory: Clear to auscultation bilaterally without wheeze rhonchi or rales Extremities: Left stump okay. Right stump with protector.  Discharge Instructions  Discharge Orders   Future Orders Complete By Expires   Activity as tolerated - No restrictions  As directed    Scheduling Instructions:     With assistance to chair  Diet - low sodium heart healthy  As directed    Diet Carb Modified  As directed    Discharge instructions  As directed    Comments:     Air mattress overlay. Monitor blood glucose at least  daily. Monitor BMET in one week.  Pt is Jehovah's Witness. No blood products   Discharge wound care:  As directed    Comments:     Sacral wound:  Cut small piece of Aquacel and apply to inner groin and sacrum wounds Q day. Moisten with NS to remove previous dressing. Cover with foam dressing.  (Change foam dressing Q 5 days or PRN soiling.).  Continue stump protector       Medication List    STOP taking these medications       amoxicillin-clavulanate 875-125 MG per tablet  Commonly known as:  AUGMENTIN     doxycycline 100 MG tablet  Commonly known as:  VIBRA-TABS     insulin regular 100 units/mL injection  Commonly known as:  NOVOLIN R,HUMULIN R     linezolid 600 MG tablet  Commonly known as:  ZYVOX     magnesium hydroxide 400 MG/5ML suspension  Commonly known as:  MILK OF MAGNESIA      TAKE these medications       acetaminophen 325 MG tablet  Commonly known as:  TYLENOL  Take 650 mg by mouth every 4 (four) hours as needed (For pain).     B-Complex Caps  Take 1 capsule by mouth daily.     feeding supplement (GLUCERNA SHAKE) Liqd  Take 237 mLs by mouth 3 (three) times daily between meals.     BOOST DIABETIC Liqd  Take 2 oz by mouth 2 (two) times daily.     calcium-vitamin D 500-200 MG-UNIT per tablet  Commonly known as:  OSCAL WITH D  Take 1 tablet by mouth 2 (two) times daily.     diltiazem 240 MG 24 hr capsule  Commonly known as:  CARDIZEM CD  Take 240 mg by mouth daily.     enzalutamide 40 MG capsule  Commonly known as:  XTANDI  Take 160 mg by mouth daily.     feeding supplement (PRO-STAT SUGAR FREE 64) Liqd  Take 30 mLs by mouth daily.     ferrous fumarate 325 (106 FE) MG Tabs tablet  Commonly known as:  HEMOCYTE - 106 mg FE  Take 1 tablet by mouth 2 (two) times daily.     folic acid 1 MG tablet  Commonly known as:  FOLVITE  Take 1 mg by mouth daily.     furosemide 40 MG tablet  Commonly known as:  LASIX  Take 1 tablet (40 mg total) by mouth  daily.     guaiFENesin 100 MG/5ML liquid  Commonly known as:  ROBITUSSIN  Take 300 mg by mouth every 4 (four) hours as needed for cough.     HYDROcodone-acetaminophen 5-325 MG per tablet  Commonly known as:  NORCO/VICODIN  Take 1 tablet by mouth every 6 (six) hours as needed for moderate pain.     insulin glargine 100 UNIT/ML injection  Commonly known as:  LANTUS  Inject 0.05 mLs (5 Units total) into the skin daily.     ipratropium-albuterol 0.5-2.5 (3) MG/3ML Soln  Commonly known as:  DUONEB  Take 3 mLs by nebulization every 6 (six) hours as needed (dyspnea).     levothyroxine 50 MCG tablet  Commonly known as:  SYNTHROID, LEVOTHROID  Take 50 mcg  by mouth daily.     loperamide 2 MG capsule  Commonly known as:  IMODIUM  Take 2 mg by mouth every 4 (four) hours as needed for diarrhea or loose stools.     multivitamin with minerals Tabs tablet  Take 1 tablet by mouth daily.     omeprazole 40 MG capsule  Commonly known as:  PRILOSEC  Take 40 mg by mouth daily.     ondansetron 4 MG tablet  Commonly known as:  ZOFRAN  Take 1 tablet (4 mg total) by mouth every 6 (six) hours as needed for nausea.     polyethylene glycol packet  Commonly known as:  MIRALAX / GLYCOLAX  Take 17 g by mouth daily as needed (constipation). Dissolve in 8 oz of liquid and drink     potassium chloride SA 20 MEQ tablet  Commonly known as:  K-DUR,KLOR-CON  Take 1 tablet (20 mEq total) by mouth daily.     senna 8.6 MG tablet  Commonly known as:  SENOKOT  Take 1 tablet (8.6 mg total) by mouth daily as needed for constipation.     vitamin C 500 MG tablet  Commonly known as:  ASCORBIC ACID  Take 500 mg by mouth daily.       No Known Allergies     Follow-up Information   Please follow up. (SNF provider 1 week)       Follow up with Wylene Simmer, MD In 2 weeks.   Specialty:  Orthopedic Surgery   Contact information:   [address] [place] [address] [place] [address] [phone]         The results of significant diagnostics from this hospitalization (including imaging, microbiology, ancillary and laboratory) are listed below for reference.    Significant Diagnostic Studies: Dg Chest Port 1 View  12/21/2013   CLINICAL DATA:  Confirm line placement.  EXAM: PORTABLE CHEST - 1 VIEW  COMPARISON:  DG CHEST 1V PORT dated 12/20/2013  FINDINGS: Interval placement of left peripherally inserted central venous catheter with distal tip projecting in proximal to mid superior vena cava.  The cardiac silhouette appears at least mildly enlarged, similar. Mediastinal silhouette is nonsuspicious. Right greater than left lung base airspace opacities persist with small right pleural effusion. Similarly overall increased lung volumes. No pneumothorax. Soft tissue planes and included osseous structures are nonsuspicious.  IMPRESSION: Left PICC distal tip projects in proximal to mid superior vena cava, no pneumothorax.  Stable cardiomegaly, bibasilar airspace opacities and suspected small right pleural effusion in a background of COPD.   Electronically Signed   By: Elon Alas   On: 12/21/2013 20:01   Dg Chest Port 1 View  12/20/2013   CLINICAL DATA:  Fever, atrial fibrillation  EXAM: PORTABLE CHEST - 1 VIEW  COMPARISON:  12/14/2013  FINDINGS: Background hyperinflation compatible with COPD/ emphysema. Similar small pleural effusions persist with compressive basilar atelectasis. No significant interval change. Upper lobes remain clear. No pneumothorax. Trachea is midline.  IMPRESSION: COPD/ emphysema  Similar small effusions with basilar compressive atelectasis   Electronically Signed   By: Daryll Brod M.D.   On: 12/20/2013 15:56   Echocardiogram  - Left ventricle: The cavity size was mildly dilated. Systolic function was normal. The estimated ejection fraction was in the range of 55% to 60%. Wall motion was normal; there were no regional wall motion abnormalities. Doppler parameters are  consistent with abnormal left ventricular relaxation (grade 1 diastolic dysfunction). - Aortic valve: Trivial regurgitation. - Left atrium: The atrium  was mildly dilated.  EKG Atrial fibrillation with left bundle branch block  Microbiology: Recent Results (from the past 240 hour(s))  CULTURE, BLOOD (ROUTINE X 2)     Status: None   Collection Time    12/20/13 12:48 PM      Result Value Ref Range Status   Specimen Description BLOOD RIGHT ANTECUBITAL   Final   Special Requests BOTTLES DRAWN AEROBIC AND ANAEROBIC 5ML   Final   Culture  Setup Time     Final   Value: 12/20/2013 16:03     Performed at [facility]   Culture     Final   Value: NO GROWTH 5 DAYS     Performed at [facility]   Report Status 12/26/2013 FINAL   Final  CULTURE, BLOOD (ROUTINE X 2)     Status: None   Collection Time    12/20/13 12:48 PM      Result Value Ref Range Status   Specimen Description BLOOD LEFT ANTECUBITAL   Final   Special Requests BOTTLES DRAWN AEROBIC AND ANAEROBIC 5ML   Final   Culture  Setup Time     Final   Value: 12/20/2013 16:02     Performed at [facility]   Culture     Final   Value: NO GROWTH 5 DAYS     Performed at [facility]   Report Status 12/26/2013 FINAL   Final  WOUND CULTURE     Status: None   Collection Time    12/20/13  2:42 PM      Result Value Ref Range Status   Specimen Description WOUND   Final   Special Requests Normal   Final   Gram Stain     Final   Value: NO WBC SEEN     NO SQUAMOUS EPITHELIAL CELLS SEEN     RARE GRAM POSITIVE COCCI IN PAIRS     RARE GRAM NEGATIVE RODS     Performed at [facility]   Culture     Final   Value: MULTIPLE ORGANISMS PRESENT, NONE PREDOMINANT     Note: NO STAPHYLOCOCCUS AUREUS ISOLATED NO GROUP A STREP (S.PYOGENES) ISOLATED     Performed at [facility]   Report Status 12/23/2013 FINAL   Final  URINE CULTURE     Status: None   Collection Time    12/20/13  3:20 PM      Result  Value Ref Range Status   Specimen Description URINE, SUPRAPUBIC   Final   Special Requests NONE   Final   Culture  Setup Time     Final   Value: 12/21/2013 06:06     Performed at [facility]     Final   Value: NO GROWTH     Performed at [facility]   Culture     Final   Value: NO GROWTH     Performed at [facility]   Report Status 12/22/2013 FINAL   Final  CLOSTRIDIUM DIFFICILE BY PCR     Status: None   Collection Time    12/20/13  3:20 PM      Result Value Ref Range Status   C difficile by pcr NEGATIVE  NEGATIVE Final   Comment: Performed at [facility]  SURGICAL PCR SCREEN     Status: None   Collection Time    12/22/13  2:23 AM      Result Value Ref Range  Status   MRSA, PCR NEGATIVE  NEGATIVE Final   Staphylococcus aureus NEGATIVE  NEGATIVE Final   Comment:            The Xpert SA Assay (FDA     approved for NASAL specimens     in patients over 28 years of age),     is one component of     a comprehensive surveillance     program.  Test performance has     been validated by [employer] for patients greater     than or equal to 39 year old.     It is not intended     to diagnose infection nor to     guide or monitor treatment.     Labs: Basic Metabolic Panel:  Recent Labs Lab 12/20/13 1248 12/21/13 0440 12/23/13 0345 12/24/13 0619 12/26/13 0545  NA 138 137 136* 136* 138  K 4.9 4.7 4.1 5.4* 4.6  CL 105 106 103 106 105  CO2 21 19 22 22 22   GLUCOSE 222* 123* 133* 88 88  BUN 34* 30* 19 25* 37*  CREATININE 1.21 1.13 0.90 0.92 0.88  CALCIUM 9.7 9.2 8.3* 8.7 8.3*   Liver Function Tests:  Recent Labs Lab 12/20/13 1248  AST 10  ALT <5  ALKPHOS 115  BILITOT <0.2*  PROT 7.0  ALBUMIN 2.3*   No results found for this basename: LIPASE, AMYLASE,  in the last 168 hours No results found for this basename: AMMONIA,  in the last 168 hours CBC:  Recent Labs Lab 12/20/13 1248 12/21/13 0440 12/23/13 0345  12/23/13 2000 12/24/13 0619  WBC 17.1* 12.2* 10.4 14.1* 13.9*  HGB 8.7* 7.6* 6.2* 6.3* 6.1*  HCT 27.9* 25.5* 19.5* 19.7* 19.0*  MCV 91.5 92.4 92.9 92.9 92.7  PLT 287 262 206 213 213   Cardiac Enzymes:  Recent Labs Lab 12/21/13 1130 12/21/13 2250 12/22/13 0552  TROPONINI <0.30 <0.30 <0.30   BNP: BNP (last 3 results) No results found for this basename: PROBNP,  in the last 8760 hours CBG:  Recent Labs Lab 12/26/13 0657 12/26/13 1115 12/26/13 1601 12/26/13 2219 12/27/13 0704  GLUCAP 89 108* 117* 144* 102*    Signed:  Norah Fick L  [facility] 12/27/2013, 11:26 AM

## 2014-01-26 DEATH — deceased

## 2015-03-12 IMAGING — CR DG CHEST 2V
2 series · 2 of 2 positions shown · non-contrast
Comparison: July 17, 2012.

CLINICAL DATA: Hypertension

CHEST - 2 VIEW

[w chest lat]
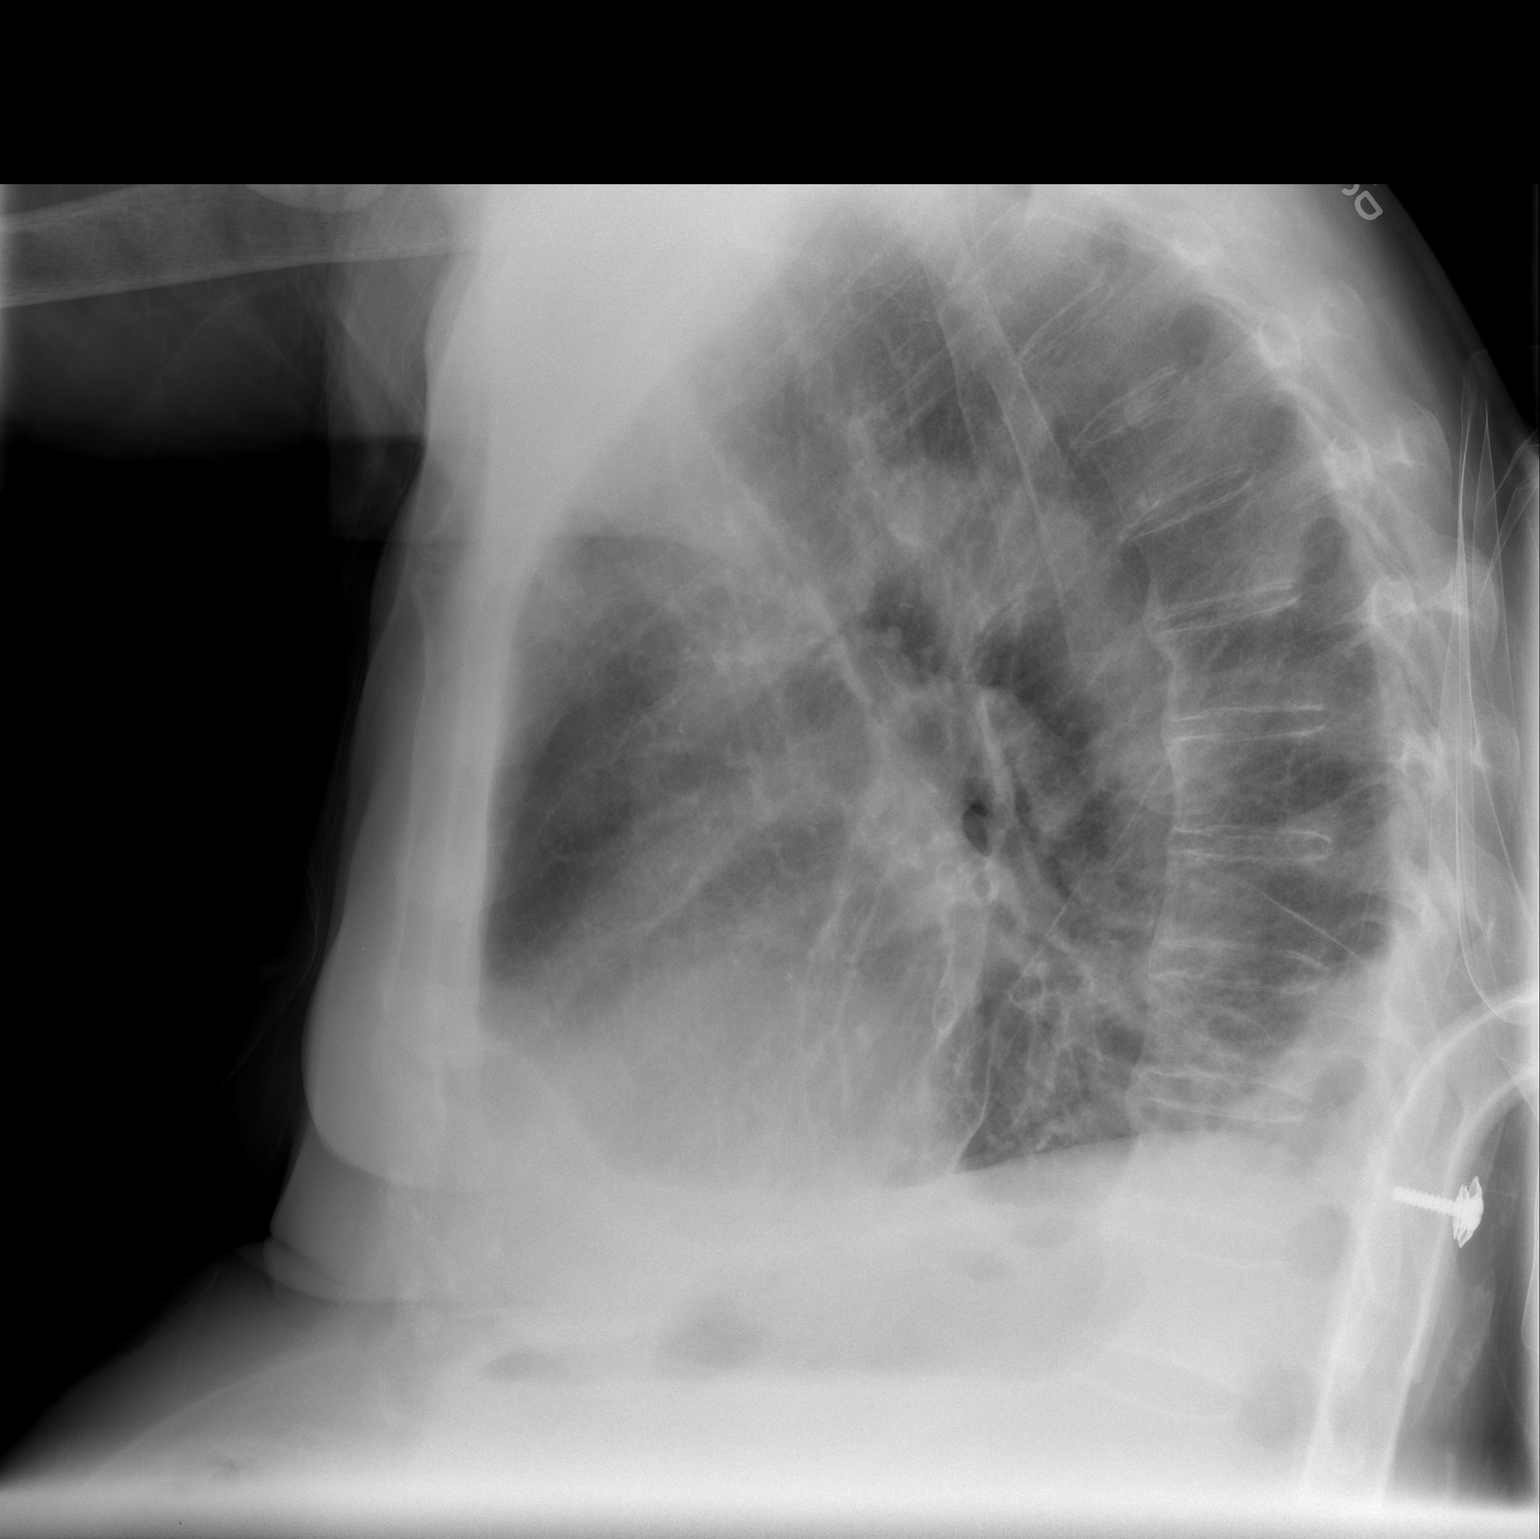

[view not recorded]
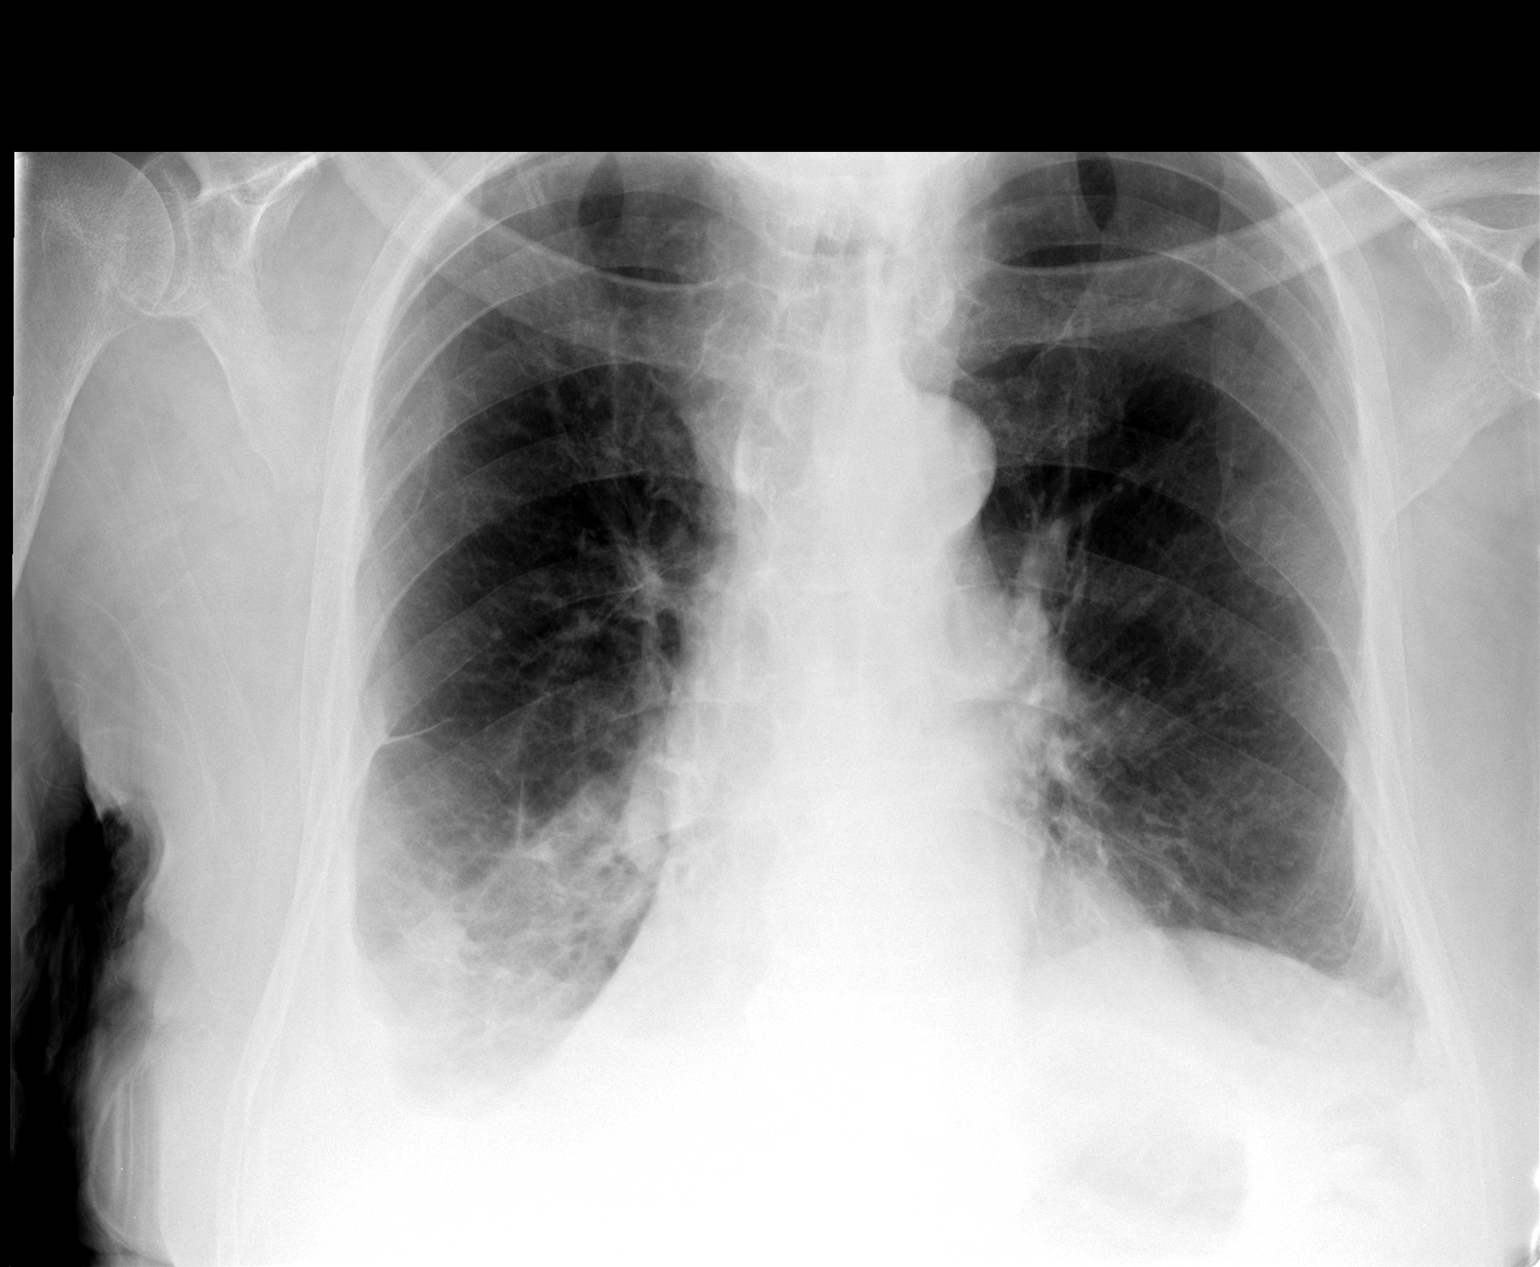

[2 of 2 positions shown; findings below may reference images not displayed]

FINDINGS: Stable cardiomegaly.  No change seen involving mild right
pleural effusion compared to prior exam.  Left lung clear.
Elevated left hemidiaphragm is again noted.
IMPRESSION: Stable changes involving right lung base.  No significant change
compared to prior exam.
